# Patient Record
Sex: Male | Born: 1955 | Race: White | Hispanic: No | Marital: Single | Smoking: Never smoker
Health system: Southern US, Community
[De-identification: ages and names within clinical notes are randomized; demographics above are authoritative.]

## PROBLEM LIST (undated history)

## (undated) DIAGNOSIS — F70 Mild intellectual disabilities: Secondary | ICD-10-CM

## (undated) DIAGNOSIS — I2510 Atherosclerotic heart disease of native coronary artery without angina pectoris: Secondary | ICD-10-CM

## (undated) DIAGNOSIS — I10 Essential (primary) hypertension: Secondary | ICD-10-CM

## (undated) DIAGNOSIS — E119 Type 2 diabetes mellitus without complications: Secondary | ICD-10-CM

## (undated) DIAGNOSIS — J45909 Unspecified asthma, uncomplicated: Secondary | ICD-10-CM

## (undated) DIAGNOSIS — I219 Acute myocardial infarction, unspecified: Secondary | ICD-10-CM

## (undated) DIAGNOSIS — E669 Obesity, unspecified: Secondary | ICD-10-CM

## (undated) DIAGNOSIS — K219 Gastro-esophageal reflux disease without esophagitis: Secondary | ICD-10-CM

## (undated) DIAGNOSIS — R1310 Dysphagia, unspecified: Secondary | ICD-10-CM

## (undated) DIAGNOSIS — E785 Hyperlipidemia, unspecified: Secondary | ICD-10-CM

## (undated) HISTORY — DX: Gastro-esophageal reflux disease without esophagitis: K21.9

## (undated) HISTORY — DX: Mild intellectual disabilities: F70

## (undated) HISTORY — DX: Dysphagia, unspecified: R13.10

## (undated) HISTORY — DX: Atherosclerotic heart disease of native coronary artery without angina pectoris: I25.10

## (undated) HISTORY — DX: Essential (primary) hypertension: I10

## (undated) HISTORY — DX: Hyperlipidemia, unspecified: E78.5

## (undated) HISTORY — DX: Obesity, unspecified: E66.9

---

## 1989-07-17 HISTORY — PX: INGUINAL HERNIA REPAIR: SUR1180

## 2013-08-16 HISTORY — PX: CARDIAC CATHETERIZATION: SHX172

## 2013-08-28 ENCOUNTER — Ambulatory Visit: Payer: Self-pay | Admitting: Cardiovascular Disease

## 2013-12-14 ENCOUNTER — Encounter: Payer: Self-pay | Admitting: *Deleted

## 2013-12-15 ENCOUNTER — Ambulatory Visit (INDEPENDENT_AMBULATORY_CARE_PROVIDER_SITE_OTHER): Payer: Medicare Other | Admitting: Cardiovascular Disease

## 2013-12-15 ENCOUNTER — Other Ambulatory Visit: Payer: Self-pay

## 2013-12-15 ENCOUNTER — Encounter: Payer: Self-pay | Admitting: *Deleted

## 2013-12-15 ENCOUNTER — Ambulatory Visit: Payer: Self-pay | Admitting: Cardiovascular Disease

## 2013-12-15 ENCOUNTER — Encounter: Payer: Self-pay | Admitting: Cardiovascular Disease

## 2013-12-15 VITALS — BP 100/78 | HR 76 | Ht 68.0 in | Wt 232.8 lb

## 2013-12-15 DIAGNOSIS — I10 Essential (primary) hypertension: Secondary | ICD-10-CM

## 2013-12-15 DIAGNOSIS — E785 Hyperlipidemia, unspecified: Secondary | ICD-10-CM

## 2013-12-15 DIAGNOSIS — I25810 Atherosclerosis of coronary artery bypass graft(s) without angina pectoris: Secondary | ICD-10-CM

## 2013-12-15 DIAGNOSIS — I2510 Atherosclerotic heart disease of native coronary artery without angina pectoris: Secondary | ICD-10-CM

## 2013-12-15 MED ORDER — CLOPIDOGREL BISULFATE 75 MG PO TABS: 75.0000 mg | ORAL_TABLET | Freq: Every day | ORAL | Status: DC

## 2013-12-15 NOTE — Patient Instructions (Addendum)
Your physician has recommended you make the following change in your medication:  Start Plavix 75 mg daily   You will need to have [facility] before the day of your procedure   Your physician recommends that you have lab work today for your procedure.   You are having a Angioplasty with stent placement with Dr. Kirke [facility]inArida at [facility] on 12/27/13 at 11:30 am.

## 2013-12-16 ENCOUNTER — Encounter: Payer: Self-pay | Admitting: Cardiovascular Disease

## 2013-12-16 DIAGNOSIS — E785 Hyperlipidemia, unspecified: Secondary | ICD-10-CM | POA: Insufficient documentation

## 2013-12-16 DIAGNOSIS — I10 Essential (primary) hypertension: Secondary | ICD-10-CM | POA: Insufficient documentation

## 2013-12-16 DIAGNOSIS — I2510 Atherosclerotic heart disease of native coronary artery without angina pectoris: Secondary | ICD-10-CM | POA: Insufficient documentation

## 2013-12-16 LAB — CBC WITH DIFFERENTIAL
BASOS ABS: 0 10*3/uL (ref 0.0–0.2)
Basos: 0 %
EOS ABS: 0.6 10*3/uL — AB (ref 0.0–0.4)
Eos: 8 %
HEMATOCRIT: 45 % (ref 37.5–51.0)
Hemoglobin: 15.3 g/dL (ref 12.6–17.7)
Immature Grans (Abs): 0 10*3/uL (ref 0.0–0.1)
Immature Granulocytes: 0 %
LYMPHS: 18 %
Lymphocytes Absolute: 1.5 10*3/uL (ref 0.7–3.1)
MCH: 28.2 pg (ref 26.6–33.0)
MCHC: 34 g/dL (ref 31.5–35.7)
MCV: 83 fL (ref 79–97)
Monocytes Absolute: 0.6 10*3/uL (ref 0.1–0.9)
Monocytes: 7 %
Neutrophils Absolute: 5.5 10*3/uL (ref 1.4–7.0)
Neutrophils Relative %: 67 %
Platelets: 223 10*3/uL (ref 150–379)
RBC: 5.42 x10E6/uL (ref 4.14–5.80)
RDW: 14.2 % (ref 12.3–15.4)
WBC: 8.2 10*3/uL (ref 3.4–10.8)

## 2013-12-16 LAB — BASIC METABOLIC PANEL
BUN/Creatinine Ratio: 11 (ref 9–20)
BUN: 15 mg/dL (ref 6–24)
CHLORIDE: 87 mmol/L — AB (ref 97–108)
CO2: 26 mmol/L (ref 18–29)
Calcium: 9.7 mg/dL (ref 8.7–10.2)
Creatinine, Ser: 1.32 mg/dL — ABNORMAL HIGH (ref 0.76–1.27)
GFR calc non Af Amer: 59 mL/min/{1.73_m2} — ABNORMAL LOW (ref >59)
GFR, EST AFRICAN AMERICAN: 69 mL/min/{1.73_m2} (ref >59)
GLUCOSE: 354 mg/dL — AB (ref 65–99)
Potassium: 3.8 mmol/L (ref 3.5–5.2)
Sodium: 134 mmol/L (ref 134–144)

## 2013-12-16 LAB — PROTIME-INR
INR: 1.1 (ref 0.8–1.2)
Prothrombin Time: 10.8 s (ref 9.1–12.0)

## 2013-12-16 NOTE — Assessment & Plan Note (Signed)
Blood pressure is well controlled on current medications. 

## 2013-12-16 NOTE — Progress Notes (Signed)
Primary care physician: Dr. Dario GuardianJadali.   HPI  This is a 58 year old man who was referred by Dr. Dario GuardianJadali for a second opinion regarding management of coronary artery disease. He has known history of type 2 diabetes, hypertension, hyperlipidemia and obesity. He has known history of mild mental retardation and currently lives in a group home. He makes his own decisions. In October of 2014, he underwent a nuclear stress test due to significant exertional dyspnea. The stress test was abnormal and thus he was referred for cardiac catheterization which was done by Dr. Welton FlakesKhan. It showed significant three-vessel coronary artery disease. [facility] was [facility]. However, the patient had no good targets in the LAD. The RCA disease was also in the small branches. Thus, he was felt to be not a candidate for CABG. Medical therapy versus PCI was recommended. He was supposed to see an interventional cardiologist there but that never happened. The patient denies any chest discomfort. However, he has dyspnea with minimal activities which has been progressive. No orthopnea, PND or lower extremity edema. He is compliant with his medications. I reviewed the patient's previous records and also personally reviewed his cardiac catheterization film.  I spent a minimal of 30 minutes reviewing his records.  No Known Allergies   Current Outpatient Prescriptions on File Prior to Visit  Medication Sig Dispense Refill  . albuterol (PROVENTIL HFA;VENTOLIN HFA) 108 (90 BASE) MCG/ACT inhaler Inhale into the lungs every 6 (six) hours as needed for wheezing or shortness of breath.      Marland Kitchen. aspirin 81 MG tablet Take 81 mg by mouth daily.      Marland Kitchen. atorvastatin (LIPITOR) 40 MG tablet Take 40 mg by mouth daily.      . DULoxetine (CYMBALTA) 60 MG capsule Take 60 mg by mouth daily.      Marland Kitchen. glipiZIDE (GLUCOTROL) 5 MG tablet Take 5 mg by mouth daily before breakfast.      . lisinopril (PRINIVIL,ZESTRIL) 40 MG tablet Take 40 mg by mouth  daily.      . metFORMIN (GLUCOPHAGE) 500 MG tablet Take 500 mg by mouth 2 (two) times daily with a meal.      . metoprolol succinate (TOPROL-XL) 50 MG 24 hr tablet Take 50 mg by mouth 2 (two) times daily. Take with or immediately following a meal.      . risperidone (RISPERDAL) 4 MG tablet Take 4 mg by mouth daily.      . sitaGLIPtin (JANUVIA) 100 MG tablet Take 100 mg by mouth daily.      . traZODone (DESYREL) 50 MG tablet Take 50 mg by mouth at bedtime.       No current facility-administered medications on file prior to visit.     Past Medical History  Diagnosis Date  . Diabetes mellitus without complication   . Obesity   . Mildly mentally retarded   . Coronary atherosclerosis of native coronary vessel     Cardiac catheterization in October of 2014 showed significant three-vessel coronary artery disease with ejection fraction of 55%. [facility] was [facility] but felt that there was no good target in the LAD.  Marland Kitchen. Hyperlipidemia   . Hypertension      Past Surgical History  Procedure Laterality Date  . Inguinal hernia repair    . Cardiac catheter[facility]  08/[facility]     Family History  Problem Relation Age of Onset  . Hypertension Father   . Diabetes Father  History   Social History  . Marital Status: Single    Spouse Name: N/A    Number of Children: N/A  . Years of Education: N/A   Occupational History  . Not on file.   Social History Main Topics  . Smoking status: Never Smoker   . Smokeless tobacco: Not on file  . Alcohol Use: No  . Drug Use: No  . Sexual Activity: Not on file   Other Topics Concern  . Not on file   Social History Narrative  . No narrative on file     ROS A 10 point review of system was performed. It is negative other than that mentioned in the history of present illness.   PHYSICAL EXAM   BP 100/78  Pulse 76  Ht 5\' 8"  (1.727 m)  Wt 232 lb 12 oz (105.575 kg)  BMI 35.40 kg/m2 Constitutional: He is oriented to  person, place, and time. He appears well-developed and well-nourished. No distress.  HENT: No nasal discharge.  Head: Normocephalic and atraumatic.  Eyes: Pupils are equal and round.  No discharge. Neck: Normal range of motion. Neck supple. No JVD present. No thyromegaly present.  Cardiovascular: Normal rate, regular rhythm, normal heart sounds. Exam reveals no gallop and no friction rub. No murmur heard.  Pulmonary/Chest: Effort normal and breath sounds normal. No stridor. No respiratory distress. He has no wheezes. He has no rales. He exhibits no tenderness.  Abdominal: Soft. Bowel sounds are normal. He exhibits no distension. There is no tenderness. There is no rebound and no guarding.  Musculoskeletal: Normal range of motion. He exhibits no edema and no tenderness.  Neurological: He is alert and oriented to person, place, and time. Coordination normal.  Skin: Skin is warm and dry. No rash noted. He is not diaphoretic. No erythema. No pallor.  Psychiatric: He has a normal mood and affect. His behavior is normal. Judgment and thought content normal.       EKG: Normal sinus rhythm with incomplete right bundle branch block left anterior fascicular block and previous septal and inferior infarcts   ASSESSMENT AND PLAN

## 2013-12-16 NOTE — Assessment & Plan Note (Signed)
He is currently on atorvastatin 40 mg once daily.

## 2013-12-16 NOTE — Assessment & Plan Note (Signed)
Although the patient has no chest discomfort, his dyspnea with minimal activities suggestive of class III angina in spite of optimal medical therapy. He had an abnormal nuclear stress test. I personally reviewed his cardiac catheterization which showed:  Severe diffuse disease in the LAD extending from the mid to distal segment in multiple areas, severe diagonal disease, severe 95% stenosis in the ostial ramus artery which is a large artery supplying the majority of the lateral wall. The RCA was ectatic with mild nonobstructive disease in the main vessel. There was diffuse disease in the PDA and PLs.  I agree that there are no good targets for CABG. However, the patient has large myocardium at risk for ischemia especially the LAD and ramus. Due to that, I recommend proceeding with attempted PCI of the LAD and ramus. The LAD will likely require multiple stents. The disease in the ramus is ostial but has favorable angulation for stent placement. Nonetheless, this is definitely going to be a higher-risk PCI than average. This was explained extensively to the patient and his caregiver. I recommend starting treatment with Plavix 75 mg once daily.

## 2013-12-18 ENCOUNTER — Ambulatory Visit: Payer: Self-pay | Admitting: Internal Medicine

## 2013-12-22 ENCOUNTER — Encounter (HOSPITAL_COMMUNITY): Payer: Self-pay | Admitting: Pharmacy Technician

## 2013-12-22 ENCOUNTER — Telehealth: Payer: Self-pay

## 2013-12-22 NOTE — Telephone Encounter (Signed)
Pt caregiver has some questions regarding his upcoming procedure. Please call.

## 2013-12-22 NOTE — Telephone Encounter (Signed)
Group home nurse had a question. His prime doc answered it for her and she no longer needed my help.

## 2013-12-27 ENCOUNTER — Encounter (HOSPITAL_COMMUNITY)
Admission: RE | Disposition: A | Discharge status: Home / Self Care | Payer: Federal, State, Local not specified - PPO | Source: Ambulatory Visit | Attending: Cardiovascular Disease

## 2013-12-27 ENCOUNTER — Encounter (HOSPITAL_COMMUNITY): Payer: Self-pay | Admitting: *Deleted

## 2013-12-27 ENCOUNTER — Ambulatory Visit (HOSPITAL_COMMUNITY)
Admission: RE | Admit: 2013-12-27 | Discharge: 2013-12-28 | Disposition: A | Discharge status: Home / Self Care | Payer: Medicare Other | Source: Ambulatory Visit | Attending: Cardiovascular Disease | Admitting: Cardiovascular Disease

## 2013-12-27 DIAGNOSIS — I25810 Atherosclerosis of coronary artery bypass graft(s) without angina pectoris: Secondary | ICD-10-CM

## 2013-12-27 DIAGNOSIS — E669 Obesity, unspecified: Secondary | ICD-10-CM | POA: Diagnosis not present

## 2013-12-27 DIAGNOSIS — J45909 Unspecified asthma, uncomplicated: Secondary | ICD-10-CM | POA: Diagnosis not present

## 2013-12-27 DIAGNOSIS — E785 Hyperlipidemia, unspecified: Secondary | ICD-10-CM | POA: Diagnosis present

## 2013-12-27 DIAGNOSIS — I4510 Unspecified right bundle-branch block: Secondary | ICD-10-CM | POA: Insufficient documentation

## 2013-12-27 DIAGNOSIS — Z7982 Long term (current) use of aspirin: Secondary | ICD-10-CM | POA: Diagnosis not present

## 2013-12-27 DIAGNOSIS — I2510 Atherosclerotic heart disease of native coronary artery without angina pectoris: Secondary | ICD-10-CM | POA: Diagnosis present

## 2013-12-27 DIAGNOSIS — I2089 Other forms of angina pectoris: Secondary | ICD-10-CM | POA: Insufficient documentation

## 2013-12-27 DIAGNOSIS — F70 Mild intellectual disabilities: Secondary | ICD-10-CM | POA: Diagnosis not present

## 2013-12-27 DIAGNOSIS — I208 Other forms of angina pectoris: Secondary | ICD-10-CM | POA: Insufficient documentation

## 2013-12-27 DIAGNOSIS — Z6835 Body mass index (BMI) 35.0-35.9, adult: Secondary | ICD-10-CM | POA: Diagnosis not present

## 2013-12-27 DIAGNOSIS — E119 Type 2 diabetes mellitus without complications: Secondary | ICD-10-CM | POA: Diagnosis present

## 2013-12-27 DIAGNOSIS — Z955 Presence of coronary angioplasty implant and graft: Secondary | ICD-10-CM

## 2013-12-27 DIAGNOSIS — I209 Angina pectoris, unspecified: Secondary | ICD-10-CM

## 2013-12-27 DIAGNOSIS — I10 Essential (primary) hypertension: Secondary | ICD-10-CM | POA: Diagnosis not present

## 2013-12-27 HISTORY — DX: Type 2 diabetes mellitus without complications: E11.9

## 2013-12-27 HISTORY — PX: CORONARY ANGIOPLASTY WITH STENT PLACEMENT: SHX49

## 2013-12-27 HISTORY — PX: PERCUTANEOUS CORONARY STENT INTERVENTION (PCI-S): SHX5485

## 2013-12-27 HISTORY — DX: Unspecified asthma, uncomplicated: J45.909

## 2013-12-27 LAB — GLUCOSE, CAPILLARY
GLUCOSE-CAPILLARY: 255 mg/dL — AB (ref 70–99)
GLUCOSE-CAPILLARY: 247 mg/dL — AB (ref 70–99)
Glucose-Capillary: 274 mg/dL — ABNORMAL HIGH (ref 70–99)

## 2013-12-27 LAB — POCT ACTIVATED CLOTTING TIME: ACTIVATED CLOTTING TIME: 370 s

## 2013-12-27 SURGERY — PERCUTANEOUS CORONARY STENT INTERVENTION (PCI-S)
Anesthesia: LOCAL

## 2013-12-27 MED ORDER — HEPARIN (PORCINE) IN NACL 2-0.9 UNIT/ML-% IJ SOLN
INTRAMUSCULAR | Status: AC
  Filled 2013-12-27: qty 1500

## 2013-12-27 MED ORDER — NITROGLYCERIN 0.2 MG/ML ON CALL CATH LAB
INTRAVENOUS | Status: AC
  Filled 2013-12-27: qty 1

## 2013-12-27 MED ORDER — ALBUTEROL SULFATE HFA 108 (90 BASE) MCG/ACT IN AERS: 1.0000 | INHALATION_SPRAY | Freq: Three times a day (TID) | RESPIRATORY_TRACT | Status: DC

## 2013-12-27 MED ORDER — LINAGLIPTIN 5 MG PO TABS
5.0000 mg | ORAL_TABLET | Freq: Every day | ORAL | Status: DC
  Administered 2013-12-27 – 2013-12-28 (×3): 5 mg via ORAL
  Filled 2013-12-27 (×2): qty 1

## 2013-12-27 MED ORDER — CLOPIDOGREL BISULFATE 75 MG PO TABS
75.0000 mg | ORAL_TABLET | Freq: Every day | ORAL | Status: DC
  Administered 2013-12-28: 10:00:00 75 mg via ORAL
  Filled 2013-12-27: qty 1

## 2013-12-27 MED ORDER — MIDAZOLAM HCL 2 MG/2ML IJ SOLN
INTRAMUSCULAR | Status: AC
  Filled 2013-12-27: qty 2

## 2013-12-27 MED ORDER — SODIUM CHLORIDE 0.9 % IJ SOLN: 3.0000 mL | Freq: Two times a day (BID) | INTRAMUSCULAR | Status: DC

## 2013-12-27 MED ORDER — GLIPIZIDE ER 5 MG PO TB24
5.0000 mg | ORAL_TABLET | Freq: Two times a day (BID) | ORAL | Status: DC
  Administered 2013-12-27 – 2013-12-28 (×2): 5 mg via ORAL
  Filled 2013-12-27 (×3): qty 1

## 2013-12-27 MED ORDER — INSULIN ASPART 100 UNIT/ML ~~LOC~~ SOLN
0.0000 [IU] | Freq: Every day | SUBCUTANEOUS | Status: DC
Start: 2013-12-27 — End: 2013-12-28
  Administered 2013-12-27: 22:00:00 3 [IU] via SUBCUTANEOUS

## 2013-12-27 MED ORDER — INSULIN ASPART 100 UNIT/ML ~~LOC~~ SOLN: 0.0000 [IU] | Freq: Three times a day (TID) | SUBCUTANEOUS | Status: DC

## 2013-12-27 MED ORDER — ALBUTEROL SULFATE (2.5 MG/3ML) 0.083% IN NEBU
2.5000 mg | INHALATION_SOLUTION | Freq: Three times a day (TID) | RESPIRATORY_TRACT | Status: DC
  Administered 2013-12-27: 2.5 mg via RESPIRATORY_TRACT
  Filled 2013-12-27 (×2): qty 3

## 2013-12-27 MED ORDER — SODIUM CHLORIDE 0.9 % IV SOLN
INTRAVENOUS | Status: DC
  Administered 2013-12-27: 17:00:00 via INTRAVENOUS

## 2013-12-27 MED ORDER — ASPIRIN 81 MG PO CHEW
81.0000 mg | CHEWABLE_TABLET | Freq: Every day | ORAL | Status: DC
  Administered 2013-12-27 – 2013-12-28 (×2): 81 mg via ORAL
  Filled 2013-12-27 (×2): qty 1

## 2013-12-27 MED ORDER — SODIUM CHLORIDE 0.9 % IV SOLN
0.2500 mg/kg/h | INTRAVENOUS | Status: DC
  Filled 2013-12-27: qty 250

## 2013-12-27 MED ORDER — ASPIRIN 81 MG PO CHEW: 81.0000 mg | CHEWABLE_TABLET | ORAL | Status: DC

## 2013-12-27 MED ORDER — ASPIRIN 81 MG PO TABS: 81.0000 mg | ORAL_TABLET | Freq: Every day | ORAL | Status: DC

## 2013-12-27 MED ORDER — BIVALIRUDIN 250 MG IV SOLR
INTRAVENOUS | Status: AC
  Filled 2013-12-27: qty 250

## 2013-12-27 MED ORDER — CLOPIDOGREL BISULFATE 300 MG PO TABS
ORAL_TABLET | ORAL | Status: AC
  Filled 2013-12-27: qty 1

## 2013-12-27 MED ORDER — METOPROLOL TARTRATE 1 MG/ML IV SOLN
INTRAVENOUS | Status: AC
  Filled 2013-12-27: qty 5

## 2013-12-27 MED ORDER — CLOPIDOGREL BISULFATE 75 MG PO TABS: 75.0000 mg | ORAL_TABLET | ORAL | Status: DC

## 2013-12-27 MED ORDER — LISINOPRIL 40 MG PO TABS
40.0000 mg | ORAL_TABLET | Freq: Every day | ORAL | Status: DC
  Administered 2013-12-27 – 2013-12-28 (×3): 40 mg via ORAL
  Filled 2013-12-27 (×2): qty 1

## 2013-12-27 MED ORDER — FENTANYL CITRATE 0.05 MG/ML IJ SOLN
INTRAMUSCULAR | Status: AC
  Filled 2013-12-27: qty 2

## 2013-12-27 MED ORDER — LIDOCAINE HCL (PF) 1 % IJ SOLN
INTRAMUSCULAR | Status: AC
  Filled 2013-12-27: qty 30

## 2013-12-27 MED ORDER — ATORVASTATIN CALCIUM 40 MG PO TABS
40.0000 mg | ORAL_TABLET | Freq: Every day | ORAL | Status: DC
  Administered 2013-12-27 – 2013-12-28 (×3): 40 mg via ORAL
  Filled 2013-12-27 (×2): qty 1

## 2013-12-27 MED ORDER — DULOXETINE HCL 60 MG PO CPEP
60.0000 mg | ORAL_CAPSULE | Freq: Every day | ORAL | Status: DC
  Administered 2013-12-27 – 2013-12-28 (×3): 60 mg via ORAL
  Filled 2013-12-27 (×2): qty 1

## 2013-12-27 MED ORDER — RISPERIDONE 2 MG PO TABS
4.0000 mg | ORAL_TABLET | Freq: Every day | ORAL | Status: DC
  Filled 2013-12-27: qty 2

## 2013-12-27 MED ORDER — SODIUM CHLORIDE 0.9 % IV SOLN: 250.0000 mL | INTRAVENOUS | Status: DC | PRN

## 2013-12-27 MED ORDER — TRAZODONE HCL 50 MG PO TABS
50.0000 mg | ORAL_TABLET | Freq: Every day | ORAL | Status: DC
  Administered 2013-12-27: 50 mg via ORAL
  Filled 2013-12-27 (×2): qty 1

## 2013-12-27 MED ORDER — METOPROLOL TARTRATE 50 MG PO TABS
50.0000 mg | ORAL_TABLET | Freq: Two times a day (BID) | ORAL | Status: DC
  Administered 2013-12-27 – 2013-12-28 (×2): 50 mg via ORAL
  Filled 2013-12-27: qty 1
  Filled 2013-12-27: qty 2
  Filled 2013-12-27 (×2): qty 1

## 2013-12-27 MED ORDER — SODIUM CHLORIDE 0.9 % IV SOLN
INTRAVENOUS | Status: DC
  Administered 2013-12-27: 100 mL/h via INTRAVENOUS

## 2013-12-27 MED ORDER — SODIUM CHLORIDE 0.9 % IJ SOLN: 3.0000 mL | INTRAMUSCULAR | Status: DC | PRN

## 2013-12-27 NOTE — H&P (View-Only) (Signed)
Primary care physician: Dr. Dario GuardianJadali.   HPI  This is a 58 year old man who was referred by Dr. Dario GuardianJadali for a second opinion regarding management of coronary artery disease. He has known history of type 2 diabetes, hypertension, hyperlipidemia and obesity. He has known history of mild mental retardation and currently lives in a group home. He makes his own decisions. In October of 2014, he underwent a nuclear stress test due to significant exertional dyspnea. The stress test was abnormal and thus he was referred for cardiac catheterization which was done by Dr. Welton FlakesKhan. It showed significant three-vessel coronary artery disease. He was [facility] to [facility]. However, the patient had no good targets in the LAD. The RCA disease was also in the small branches. Thus, he was felt to be not a candidate for CABG. Medical therapy versus PCI was recommended. He was supposed to see an interventional cardiologist there but that never happened. The patient denies any chest discomfort. However, he has dyspnea with minimal activities which has been progressive. No orthopnea, PND or lower extremity edema. He is compliant with his medications. I reviewed the patient's previous records and also personally reviewed his cardiac catheterization film.  I spent a minimal of 30 minutes reviewing his records.  No Known Allergies   Current Outpatient Prescriptions on File Prior to Visit  Medication Sig Dispense Refill  . albuterol (PROVENTIL HFA;VENTOLIN HFA) 108 (90 BASE) MCG/ACT inhaler Inhale into the lungs every 6 (six) hours as needed for wheezing or shortness of breath.      Marland Kitchen. aspirin 81 MG tablet Take 81 mg by mouth daily.      Marland Kitchen. atorvastatin (LIPITOR) 40 MG tablet Take 40 mg by mouth daily.      . DULoxetine (CYMBALTA) 60 MG capsule Take 60 mg by mouth daily.      Marland Kitchen. glipiZIDE (GLUCOTROL) 5 MG tablet Take 5 mg by mouth daily before breakfast.      . lisinopril (PRINIVIL,ZESTRIL) 40 MG tablet Take 40 mg by mouth  daily.      . metFORMIN (GLUCOPHAGE) 500 MG tablet Take 500 mg by mouth 2 (two) times daily with a meal.      . metoprolol succinate (TOPROL-XL) 50 MG 24 hr tablet Take 50 mg by mouth 2 (two) times daily. Take with or immediately following a meal.      . risperidone (RISPERDAL) 4 MG tablet Take 4 mg by mouth daily.      . sitaGLIPtin (JANUVIA) 100 MG tablet Take 100 mg by mouth daily.      . traZODone (DESYREL) 50 MG tablet Take 50 mg by mouth at bedtime.       No current facility-administered medications on file prior to visit.     Past Medical History  Diagnosis Date  . Diabetes mellitus without complication   . Obesity   . Mildly mentally retarded   . Coronary atherosclerosis of native coronary vessel     Cardiac catheterization in October of 2014 showed significant three-vessel coronary artery disease with ejection fraction of 55%. He was [facility] at [facility] but felt that there was no good target in the LAD.  Marland Kitchen. Hyperlipidemia   . Hypertension      Past Surgical History  Procedure Laterality Date  . Inguinal hernia repair    . Cardiac catheterization  08/2013    [facility]     Family History  Problem Relation Age of Onset  . Hypertension Father   . Diabetes Father  History   Social History  . Marital Status: Single    Spouse Name: N/A    Number of Children: N/A  . Years of Education: N/A   Occupational History  . Not on file.   Social History Main Topics  . Smoking status: Never Smoker   . Smokeless tobacco: Not on file  . Alcohol Use: No  . Drug Use: No  . Sexual Activity: Not on file   Other Topics Concern  . Not on file   Social History Narrative  . No narrative on file     ROS A 10 point review of system was performed. It is negative other than that mentioned in the history of present illness.   PHYSICAL EXAM   BP 100/78  Pulse 76  Ht 5\' 8"  (1.727 m)  Wt 232 lb 12 oz (105.575 kg)  BMI 35.40 kg/m2 Constitutional: He is oriented to  person, place, and time. He appears well-developed and well-nourished. No distress.  HENT: No nasal discharge.  Head: Normocephalic and atraumatic.  Eyes: Pupils are equal and round.  No discharge. Neck: Normal range of motion. Neck supple. No JVD present. No thyromegaly present.  Cardiovascular: Normal rate, regular rhythm, normal heart sounds. Exam reveals no gallop and no friction rub. No murmur heard.  Pulmonary/Chest: Effort normal and breath sounds normal. No stridor. No respiratory distress. He has no wheezes. He has no rales. He exhibits no tenderness.  Abdominal: Soft. Bowel sounds are normal. He exhibits no distension. There is no tenderness. There is no rebound and no guarding.  Musculoskeletal: Normal range of motion. He exhibits no edema and no tenderness.  Neurological: He is alert and oriented to person, place, and time. Coordination normal.  Skin: Skin is warm and dry. No rash noted. He is not diaphoretic. No erythema. No pallor.  Psychiatric: He has a normal mood and affect. His behavior is normal. Judgment and thought content normal.       EKG: Normal sinus rhythm with incomplete right bundle branch block left anterior fascicular block and previous septal and inferior infarcts   ASSESSMENT AND PLAN

## 2013-12-27 NOTE — CV Procedure (Signed)
Cardiac Catheterization Procedure Note  Name: Maurion Walkowiak MRN: [id number] DOB: 09-29-1956  Procedure: Left Heart Cath, Selective Coronary Angiography, balloon angioplasty of the distal LAD mid LAD without stent placement, balloon angioplasty and drug-eluting stent placement to the ostial first diagonal  Indication: Class III angina on optimal medical therapy. See clinic note for details.  Medications:  Sedation:  2 mg IV Versed, 75 mcg IV Fentanyl  Contrast:  260 mL Omnipaque  Procedural Details: The right wrist was prepped, draped, and anesthetized with 1% lidocaine. Using the modified Seldinger technique, a 5 French Slender sheath was introduced into the right radial artery. 3 mg of verapamil was administered through the sheath. A right Judkins catheter was used to image the right coronary artery and recorded ventricular pressure. Left ventricular angiography was not performed. An XB 3.0 guiding catheter was used to engage the left main coronary artery.  Catheter exchanges were performed over an exchange length guidewire. There were no immediate procedural complications.  Procedural Findings:  Hemodynamics: AO:  157/94   mmHg LV:  159/8    mmHg LVEDP: 15  mmHg  Coronary angiography: Coronary dominance: Right   Left Main:  Mildly calcified and normal.  Left Anterior Descending (LAD):  Heavily calcified throughout its course. There is diffuse 80% stenosis in the midsegment followed by 95% stenosis in the distal segment. The whole distal LAD is diffusely diseased and subtotally occluded.  1st diagonal (D1):  Very large in size with 95% ostial stenosis.  2nd diagonal (D2):  Normal in size with 90% ostial stenosis.  3rd diagonal (D3):  Medium in size with 80-90% ostial stenosis. There is a 4th diagonal which is medium in size with 60% ostial stenosis.  Circumflex (LCx):  Small in size with 90% proximal stenosis. Most of the posterior lateral wall is actually supplied by the first  diagonal.  Right Coronary Artery: Normal in size and dominant. There is mild ectasia in the proximal and midsegment with 20% mid stenosis.  Posterior descending artery: Normal in size with minor irregularities.  Posterior AV segment: Normal in size with minor irregularities.  Posterolateral branchs:  PL1 is small in size with diffuse 60% disease. PL 2 is normal in size with 90% ostial stenosis.  Left ventriculography: Was not performed  PCI Note:  Following the diagnostic procedure, the decision was made to proceed with PCI.  Weight-based bivalirudin was given for anticoagulation. Once a therapeutic ACT was achieved, a 6 [place] XB 3.0 guide catheter was inserted.  A  intuition coronary guidewire was used to cross the lesion in the proximal and distal LAD.  The distal lesion was predilated with a 2.0 x 15 mm balloon balloon with multiple prolonged inflations this area did not improve in spite of multiple inflations. The same balloon with a then used to predilate the lesion in the mid LAD. My plan was to stent the LAD only if there is good distal runoff achieved with balloon angioplasty. This was not the case he and I felt that placing a stent would place the patient at significant risk for future stent thrombosis. Due to that, I elected to use and angioscope alone to dilate the mid lesion. The balloon was 2.5 x 15 mm with prolonged inflations performed throughout the midsegment. There was residual 80% disease in the distal lesion and 40-50% residual disease in the mid LAD. I then used the same wire to cross the lesion in the first diagonal. The lesion was predilated with a 2.0 x 12 mm  balloon. I tried to place a 2.75 x 16 mm Promus drug-eluting stent but this did not pass the lesion due to significant calcifications and tortuosity. I attempted to use and angioscope alone but this did not cross either. I decided to go with a 2.5 x 12 mm compliant balloon and dilated the lesion to 12 atmosphere. The stent  did not pass. I decided to use a second buddy wire. In the meanwhile, I did lose guiding catheter position and had to change guiding catheter to an XB LAD 3.5.  The lesion was then stented with a 2.75 x 16 mm Promus drug-eluting stent. The stent was 1 millimeter into the LAD. Due to significant tortuosity in that area, I felt that using a noncompliant balloon to post dilate with of the feasible.  Following PCI, there was 10 % residual stenosis and TIMI-3 flow. Final angiography confirmed an excellent result. The patient tolerated the procedure well. There were no immediate procedural complications. A TR band was used for radial hemostasis. The patient was transferred to the post catheterization recovery area for further monitoring.  PCI Data: Vessel - LAD/Segment - distal Percent Stenosis (pre)  95 TIMI-flow 2 Stent balloon angioplasty only Percent Stenosis (post) 80 TIMI-flow (post) 2  Vessel - LAD/Segment - mid Percent Stenosis (pre)  80% TIMI-flow 3 Stent balloon angioplasty only with and angioscore balloon Percent Stenosis (post) 40-50% TIMI-flow (post) 3  Vessel - first diagonal/Segment - ostial Percent Stenosis (pre)  95% TIMI-flow 3 Stent 2.75 x 16 mm Promus drug-eluting stent Percent Stenosis (post) 10% TIMI-flow (post) 3  Final Conclusions:  1. Significant three-vessel coronary artery disease with diffuse and distally occluded LAD.  2.  unsuccessful distal LAD angioplasty due to a diffuse disease. Successful balloon angioplasty of the mid LAD without stent placement 3. Successful drug-eluting stent placement to the ostial diagonal  Recommendations:  Dual antiplatelet therapy for at least 12 months.  Lorine BearsMuhammad Arida MD, [facility] 12/27/2013, 3:51 PM

## 2013-12-27 NOTE — Interval H&P Note (Signed)
Cath Lab Visit (complete for each Cath Lab visit)  Clinical Evaluation Leading to the Procedure:   ACS: no  Non-ACS:    Anginal Classification: CCS III  Anti-ischemic medical therapy: Maximal Therapy (2 or more classes of medications)  Non-Invasive Test Results: No non-invasive testing performed  Prior CABG: No previous CABG      History and Physical Interval Note:  12/27/2013 1:44 PM  Larry Riggs  has presented today for surgery, with the diagnosis of Claudication  The various methods of treatment have been discussed with the patient and family. After consideration of risks, benefits and other options for treatment, the patient has consented to  Procedure(s): PERCUTANEOUS CORONARY STENT INTERVENTION (PCI-S) (N/A) as a surgical intervention .  The patient's history has been reviewed, patient examined, no change in status, stable for surgery.  I have reviewed the patient's chart and labs.  Questions were answered to the patient's satisfaction.     Larry Riggs

## 2013-12-28 ENCOUNTER — Telehealth: Payer: Self-pay

## 2013-12-28 ENCOUNTER — Other Ambulatory Visit: Payer: Self-pay

## 2013-12-28 ENCOUNTER — Encounter (HOSPITAL_COMMUNITY): Payer: Self-pay | Admitting: Nurse Practitioner

## 2013-12-28 DIAGNOSIS — I2510 Atherosclerotic heart disease of native coronary artery without angina pectoris: Secondary | ICD-10-CM | POA: Diagnosis not present

## 2013-12-28 DIAGNOSIS — Z9861 Coronary angioplasty status: Secondary | ICD-10-CM

## 2013-12-28 DIAGNOSIS — E785 Hyperlipidemia, unspecified: Secondary | ICD-10-CM

## 2013-12-28 DIAGNOSIS — Z955 Presence of coronary angioplasty implant and graft: Secondary | ICD-10-CM | POA: Insufficient documentation

## 2013-12-28 DIAGNOSIS — E119 Type 2 diabetes mellitus without complications: Secondary | ICD-10-CM | POA: Diagnosis present

## 2013-12-28 DIAGNOSIS — I10 Essential (primary) hypertension: Secondary | ICD-10-CM

## 2013-12-28 DIAGNOSIS — J45909 Unspecified asthma, uncomplicated: Secondary | ICD-10-CM | POA: Diagnosis present

## 2013-12-28 DIAGNOSIS — I209 Angina pectoris, unspecified: Secondary | ICD-10-CM

## 2013-12-28 LAB — CBC
HCT: 37.3 % — ABNORMAL LOW (ref 39.0–52.0)
HEMOGLOBIN: 13.3 g/dL (ref 13.0–17.0)
MCH: 28.9 pg (ref 26.0–34.0)
MCHC: 35.7 g/dL (ref 30.0–36.0)
MCV: 80.9 fL (ref 78.0–100.0)
Platelets: 168 10*3/uL (ref 150–400)
RBC: 4.61 MIL/uL (ref 4.22–5.81)
RDW: 12.9 % (ref 11.5–15.5)
WBC: 7.2 10*3/uL (ref 4.0–10.5)

## 2013-12-28 LAB — BASIC METABOLIC PANEL
BUN: 8 mg/dL (ref 6–23)
CHLORIDE: 88 meq/L — AB (ref 96–112)
CO2: 24 mEq/L (ref 19–32)
Calcium: 9.5 mg/dL (ref 8.4–10.5)
Creatinine, Ser: 0.82 mg/dL (ref 0.50–1.35)
GFR calc Af Amer: >90 mL/min (ref >90)
GFR calc non Af Amer: >90 mL/min (ref >90)
GLUCOSE: 315 mg/dL — AB (ref 70–99)
Potassium: 3.2 mEq/L — ABNORMAL LOW (ref 3.7–5.3)
Sodium: 135 mEq/L — ABNORMAL LOW (ref 137–147)

## 2013-12-28 LAB — GLUCOSE, CAPILLARY: Glucose-Capillary: 234 mg/dL — ABNORMAL HIGH (ref 70–99)

## 2013-12-28 MED ORDER — AMLODIPINE BESYLATE 5 MG PO TABS: 5.0000 mg | ORAL_TABLET | Freq: Every day | ORAL | Status: DC

## 2013-12-28 MED ORDER — AMLODIPINE BESYLATE 5 MG PO TABS
5.0000 mg | ORAL_TABLET | Freq: Every day | ORAL | Status: DC
  Administered 2013-12-28: 08:00:00 5 mg via ORAL
  Filled 2013-12-28: qty 1

## 2013-12-28 MED ORDER — HYDRALAZINE HCL 20 MG/ML IJ SOLN
10.0000 mg | Freq: Four times a day (QID) | INTRAMUSCULAR | Status: DC | PRN
  Administered 2013-12-28: 07:00:00 10 mg via INTRAVENOUS

## 2013-12-28 MED ORDER — ACETAMINOPHEN 325 MG PO TABS
650.0000 mg | ORAL_TABLET | Freq: Once | ORAL | Status: AC
  Administered 2013-12-28: 650 mg via ORAL

## 2013-12-28 MED ORDER — HYDRALAZINE HCL 20 MG/ML IJ SOLN
INTRAMUSCULAR | Status: AC
  Filled 2013-12-28: qty 1

## 2013-12-28 MED ORDER — METFORMIN HCL 1000 MG PO TABS: 1000.0000 mg | ORAL_TABLET | Freq: Two times a day (BID) | ORAL | Status: AC

## 2013-12-28 MED FILL — Sodium Chloride IV Soln 0.9%: INTRAVENOUS | Qty: 50 | Status: AC

## 2013-12-28 NOTE — Progress Notes (Signed)
TR BAND REMOVAL  LOCATION:  right radial  DEFLATED PER PROTOCOL:  yes  TIME BAND OFF / DRESSING APPLIED: 2145   SITE UPON ARRIVAL:   Level 0  SITE AFTER BAND REMOVAL:  Level 0  REVERSE ALLEN'S TEST:    positive  CIRCULATION SENSATION AND MOVEMENT:  Within Normal Limits  yes  COMMENTS:    

## 2013-12-28 NOTE — Discharge Instructions (Signed)

## 2013-12-28 NOTE — Telephone Encounter (Signed)
Message copied by Marilynne HalstedMOODY, Sakari Alkhatib R on Thu Dec 28, 2013 11:55 AM ------      Message from: Christell FaithEKELY, TRACI L      Created: Thu Dec 28, 2013  8:47 AM      Regarding: tcm/ph       Dr. Kirke CorinArida 01/11/14 @ 11:30 ------

## 2013-12-28 NOTE — Telephone Encounter (Signed)
Message copied by Marilynne HalstedMOODY, Rivkah Wolz R on Thu Dec 28, 2013  4:47 PM ------      Message from: Christell FaithEKELY, TRACI L      Created: Thu Dec 28, 2013  8:47 AM      Regarding: tcm/ph       Dr. Kirke CorinArida 01/11/14 @ 11:30 ------

## 2013-12-28 NOTE — Discharge Summary (Signed)
Pt. Seen & examined along with Mr. Brion AlimentBerge, NP this AM.  BP notably elevated o/n - Amlodipine added.   No angina.    Stable for d/c post ambulation.  Exam: BP 136/78  Pulse 71  Temp(Src) 97.8 F (36.6 C) (Oral)  Resp 18  Ht 5\' 8"  (1.727 m)  Wt 233 lb 11 oz (106 kg)  BMI 35.54 kg/m2  SpO2 98% General appearance: alert, cooperative and appears stated age Neck: no adenopathy, no carotid bruit, no JVD and supple, symmetrical, trachea midline Lungs: clear to auscultation bilaterally and normal percussion bilaterally Heart: regular rate and rhythm, S1, S2 normal, no murmur, click, rub or gallop Abdomen: soft, non-tender; bowel sounds normal; no masses,  no organomegaly Extremities: extremities normal, atraumatic, no cyanosis or edema Pulses: 2+ and symmetric - R wrist stable.  I agree with the d/c summary.   Marykay LexHARDING,DAVID W, M.D., M.S. Interventional Cardiologist [facility]  Pager # [phone] 12/28/2013 2:13 PM     .

## 2013-12-28 NOTE — Telephone Encounter (Signed)
Attempted to [facility] on 12/[facility]/13. Left detailed message asking pt to call w/ any questions or concerns about discharge instructions &/or medications.  Also advised pt that he has an appt w/ Dr. Kirke CorinArida 2/261/15 @ 11:30.

## 2013-12-28 NOTE — Discharge Summary (Signed)
Discharge Summary   Patient ID: Larry Riggs,  MRN: [id number], DOB/AGE: 12/24/1955 58 y.o.  Admit date: 12/27/2013 Discharge date: 12/28/2013  [facility] Primary Cardiologist: M. Kirke CorinArida, MD   Discharge Diagnoses Principal Problem:   Angina, class III Active Problems:   Coronary atherosclerosis of native coronary vessel   Hypertension   Type II diabetes mellitus   Hyperlipidemia   Asthma  Allergies No Known Allergies  Procedures  Cardiac Catheterization and Percutaneous Coronary Intervention 2.11.2015  Hemodynamics: AO:  157/94   mmHg LV:  159/8    mmHg LVEDP: 15  mmHg  Coronary angiography: Coronary dominance: Right     Left Main:  Mildly calcified and normal.  Left Anterior Descending (LAD):  Heavily calcified throughout its course. There is diffuse 80% stenosis in the midsegment followed by 95% stenosis in the distal segment. The whole distal LAD is diffusely diseased and subtotally occluded.  **The distal LAD was unable to be successfully treated with balloon angioplasty secondary severe diffuse disease.  The mid LAD was successfully treated with balloon angioplasty.   1st diagonal (D1):  Very large in size with 95% ostial stenosis.   ** The D1 was successfully stented using a 2.75 x 16 mm Promus drug-eluting stent.   2nd diagonal (D2):  Normal in size with 90% ostial stenosis.  3rd diagonal (D3):  Medium in size with 80-90% ostial stenosis. There is a 4th diagonal which is medium in size with 60% ostial stenosis.  Circumflex (LCx):  Small in size with 90% proximal stenosis. Most of the posterior lateral wall is actually supplied by the first diagonal.  Right Coronary Artery: Normal in size and dominant. There is mild ectasia in the proximal and midsegment with 20% mid stenosis.  Posterior descending artery: Normal in size with minor irregularities.  Posterior AV segment: Normal in size with minor irregularities.  Posterolateral  branchs:  PL1 is small in size with diffuse 60% disease. PL 2 is normal in size with 90% ostial stenosis.  Left ventriculography: Was not performed  Final Conclusions:  1. Significant three-vessel coronary artery disease with diffuse and distally occluded LAD.   2.  unsuccessful distal LAD angioplasty due to a diffuse disease. Successful balloon angioplasty of the mid LAD without stent placement 3. Successful drug-eluting stent placement to the ostial diagonal  Recommendations:  Dual antiplatelet therapy for at least 12 months. _____________   History of Present Illness  58 year old male with prior history of coronary artery disease status post abnormal stress testing October 2014. Catheterization was subsequently performed revealing severe LAD and diagonal disease. Patient was referred to [facility] consideration of coronary artery bypass grafting however in the absence of good LAD targets, PCI was recommended. Medical therapy was implemented the patient continued to have dyspnea with minimal activity. He was referred to Dr. Kirke CorinArida on January 31, and following review of films, it was felt that percutaneous intervention was warranted.  Hospital Course  Patient presented to the [facility] catheter[facility] laboratory on February 11 and underwent diagnostic cardiac catheterization revealing severe diffuse LAD disease as well as severe disease in the ostial first diagonal. Patient also had disease within the smaller diagonal branches and left circumflex. Was felt that the LAD and first diagonal with the most likely culprit for his symptoms and therefore intervention was performed in these areas. Attempts were made to balloon the distal LAD however these attempts were ultimately unsuccessful. The mid LAD was successfully treated with balloon angioplasty. The first diagonal was able to  be successfully stented using a 2.75 by 16mm Promus drug-eluting stent. Patient tolerated procedure well and post procedure  has been ambulating without recurrent symptoms or limitations. He will be discharged home today in good condition.  Discharge Vitals Blood pressure 163/100, pulse 71, temperature 97.8 F (36.6 C), temperature source Oral, resp. rate 18, height 5\' 8"  (1.727 m), weight 233 lb 11 oz (106 kg), SpO2 98.00%.  Filed Weights   12/27/13 1153 12/28/13 0004  Weight: 232 lb (105.235 kg) 233 lb 11 oz (106 kg)   Labs  CBC  Recent Labs  12/28/13 0342  WBC 7.2  HGB 13.3  HCT 37.3*  MCV 80.9  PLT 168   Disposition  Pt is being discharged home today in good condition.  Follow-up Plans & Appointments      Follow-up Information   Follow up with [facility], MD. (as scheduled.)    Specialty:  Internal Medicine   Contact information:   [address]a [address]   [place] [address] [phone]       Follow up with Lorine Bears, MD On 01/11/2014. (11:30 AM)    Specialty:  Cardiology   Contact information:   [facility] [address] [place] [address] [phone]       Discharge Medications    Medication List         albuterol 108 (90 BASE) MCG/ACT inhaler  Commonly known as:  PROVENTIL HFA;VENTOLIN HFA  Inhale 1 puff into the lungs 3 (three) times daily.     amLODipine 5 MG tablet  Commonly known as:  NORVASC  Take 1 tablet (5 mg total) by mouth daily.     aspirin 81 MG tablet  Take 81 mg by mouth daily.     atorvastatin 40 MG tablet  Commonly known as:  LIPITOR  Take 40 mg by mouth daily.     clopidogrel 75 MG tablet  Commonly known as:  PLAVIX  Take 1 tablet (75 mg total) by mouth daily.     DULoxetine 60 MG capsule  Commonly known as:  CYMBALTA  Take 60 mg by mouth daily.     glipiZIDE 5 MG 24 hr tablet  Commonly known as:  GLUCOTROL XL  Take 5 mg by mouth 2 (two) times daily.     lisinopril 40 MG tablet  Commonly known as:  PRINIVIL,ZESTRIL  Take 40 mg by mouth daily.     metFORMIN 1000 MG tablet  Commonly known as:   GLUCOPHAGE  Take 1 tablet (1,000 mg total) by mouth 2 (two) times daily with a meal.     metoprolol 50 MG tablet  Commonly known as:  LOPRESSOR  Take 50 mg by mouth 2 (two) times daily.     RISPERDAL 4 MG tablet  Generic drug:  risperidone  Take 4 mg by mouth daily.     sitaGLIPtin 100 MG tablet  Commonly known as:  JANUVIA  Take 100 mg by mouth daily.     traZODone 50 MG tablet  Commonly known as:  DESYREL  Take 50 mg by mouth at bedtime.       Outstanding Labs/Studies  None  Duration of Discharge Encounter   Greater than 30 minutes including physician time.  Signed, Nicolasa Ducking NP 12/28/2013, 9:58 AM

## 2013-12-28 NOTE — Progress Notes (Signed)
CARDIAC REHAB PHASE I   PRE:  Rate/Rhythm: 70SR  BP:  Supine:   Sitting: 167/86  Standing:    SaO2:   MODE:  Ambulation: 500 ft   POST:  Rate/Rhythm: 85SR  BP:  Supine:   Sitting: 182/106 dinamapp, 162/92 maually  Standing:    SaO2:  [id number] Pt walked 500 ft with minimal asst . Gait steady. No CP. Tolerated well. Education completed with pt who voiced understanding. Left written diet and exercise for care giver to review also. BP at 162/92 manually after walk. Encouraged pt to watch salt. He does not use shaker. Declined CRP 2 as he stated it would be too far for transportation. Will walk on his own for ex.   Luetta Nuttingharlene Kissa Campoy, RN BSN  12/28/2013 9:39 AM

## 2014-01-11 ENCOUNTER — Encounter: Payer: Medicare Other | Admitting: Cardiovascular Disease

## 2014-01-18 ENCOUNTER — Other Ambulatory Visit: Payer: Self-pay

## 2014-01-18 ENCOUNTER — Ambulatory Visit: Payer: Federal, State, Local not specified - PPO | Admitting: Podiatry

## 2014-01-18 MED ORDER — CLOPIDOGREL BISULFATE 75 MG PO TABS: 75.0000 mg | ORAL_TABLET | Freq: Every day | ORAL | Status: DC

## 2014-01-18 MED ORDER — AMLODIPINE BESYLATE 5 MG PO TABS: 5.0000 mg | ORAL_TABLET | Freq: Every day | ORAL | Status: DC

## 2014-01-25 ENCOUNTER — Telehealth: Payer: Self-pay | Admitting: *Deleted

## 2014-01-25 NOTE — Telephone Encounter (Signed)
I have not seen this patient after stent done last month. I don't know what's going on with him. Aspirin and Plavix can not be stopped now. Schedule a follow up visit.

## 2014-01-25 NOTE — Telephone Encounter (Signed)
Scheduled follow up for patient on 02/05/14. Will contact Candice NP at [phone] (cell) or [phone] (office) after follow up visit to give her information requested for EGD/Colonoscopy clearance.

## 2014-01-25 NOTE — Telephone Encounter (Signed)
Patients GI PA called and they are recommending EGD and Colonoscopy. He had a recent stent placed.  They would like to know how to manage plavix.

## 2014-02-05 ENCOUNTER — Ambulatory Visit (INDEPENDENT_AMBULATORY_CARE_PROVIDER_SITE_OTHER): Payer: Medicare Other | Admitting: Cardiovascular Disease

## 2014-02-05 ENCOUNTER — Encounter: Payer: Self-pay | Admitting: Cardiovascular Disease

## 2014-02-05 VITALS — BP 103/73 | HR 67 | Ht 68.0 in | Wt 233.0 lb

## 2014-02-05 DIAGNOSIS — E785 Hyperlipidemia, unspecified: Secondary | ICD-10-CM

## 2014-02-05 DIAGNOSIS — I10 Essential (primary) hypertension: Secondary | ICD-10-CM

## 2014-02-05 DIAGNOSIS — Z01818 Encounter for other preprocedural examination: Secondary | ICD-10-CM

## 2014-02-05 DIAGNOSIS — I209 Angina pectoris, unspecified: Secondary | ICD-10-CM

## 2014-02-05 DIAGNOSIS — I2510 Atherosclerotic heart disease of native coronary artery without angina pectoris: Secondary | ICD-10-CM

## 2014-02-05 NOTE — Patient Instructions (Signed)
Continue same medications. We are not able to stop Aspirin or Plavix until February of 2016.   Your physician wants you to follow-up in: 6 months.  You will receive a reminder letter in the mail two months in advance. If you don't receive a letter, please call our office to schedule the follow-up appointment.

## 2014-02-05 NOTE — Assessment & Plan Note (Signed)
BP is well controlled 

## 2014-02-05 NOTE — Assessment & Plan Note (Signed)
He is doing better after recent LAD angioplasty and drug eluting stent placement to first diagonal. Treat the rest of CAD medically.  Continue dual antiplatelet therapy which can not be interrupted for at least 12 months.

## 2014-02-05 NOTE — Progress Notes (Signed)
Primary care physician: Dr. Dario GuardianJadali.   HPI  This is a 58 year old man who is here today for followup visit regarding  coronary artery disease. He has known history of type 2 diabetes, hypertension, hyperlipidemia and obesity. He has known history of mild mental retardation and currently lives in a group home. He makes his own decisions. In October of 2014, he underwent a nuclear stress test due to significant exertional dyspnea. The stress test was abnormal and thus he was referred for cardiac catheterization which was done by Dr. Welton FlakesKhan. It showed significant three-vessel coronary artery disease. [facility] was [facility] to [facility]G. However, the patient had no good targets in the LAD. The RCA disease was also in the small branches. Thus, he was felt to be not a candidate for CABG. Medical therapy versus PCI was recommended.  He continued to have significant exertional dyspnea. Thus, I proceeded with cardiac catheterization which showed significant three-vessel coronary artery disease. The LAD was diffusely diseased and occluded distally. The left circumflex had 90% proximal stenosis but was small in size. There was significant disease in the posterolateral branches of the right coronary artery. I attempted PCI of the LAD but there was significant diffuse disease and the vessel was occluded distally. I performed balloon angioplasty without stent placement with significant residual disease. I placed a drug-eluting stent to the ostial first diagonal. He is doing well with no chest pain or dyspnea. He needs a routine colonoscopy.   No Known Allergies   Current Outpatient Prescriptions on File Prior to Visit  Medication Sig Dispense Refill  . albuterol (PROVENTIL HFA;VENTOLIN HFA) 108 (90 BASE) MCG/ACT inhaler Inhale 1 puff into the lungs 3 (three) times daily.       Marland Kitchen. amLODipine (NORVASC) 5 MG tablet Take 1 tablet (5 mg total) by mouth daily.  90 tablet  3  . aspirin 81 MG tablet Take 81 mg by mouth daily.       Marland Kitchen. atorvastatin (LIPITOR) 40 MG tablet Take 40 mg by mouth daily.      . clopidogrel (PLAVIX) 75 MG tablet Take 1 tablet (75 mg total) by mouth daily.  90 tablet  3  . DULoxetine (CYMBALTA) 60 MG capsule Take 60 mg by mouth daily.      Marland Kitchen. glipiZIDE (GLUCOTROL XL) 5 MG 24 hr tablet Take 5 mg by mouth 2 (two) times daily.      Marland Kitchen. lisinopril (PRINIVIL,ZESTRIL) 40 MG tablet Take 40 mg by mouth daily.      . metFORMIN (GLUCOPHAGE) 1000 MG tablet Take 1 tablet (1,000 mg total) by mouth 2 (two) times daily with a meal.      . metoprolol (LOPRESSOR) 50 MG tablet Take 50 mg by mouth 2 (two) times daily.      . risperidone (RISPERDAL) 4 MG tablet Take 4 mg by mouth daily.      . sitaGLIPtin (JANUVIA) 100 MG tablet Take 100 mg by mouth daily.      . traZODone (DESYREL) 50 MG tablet Take 50 mg by mouth at bedtime.       No current facility-administered medications on file prior to visit.     Past Medical History  Diagnosis Date  . Obesity   . Mildly mentally retarded   . Coronary atherosclerosis of native coronary vessel     a. 08/2013 Cath: signif 3V dzs w EF of 55%, eval @ [facility] for CABG but felt that there was no good target in the LAD;  b. 12/2013  Cath/PCI: LM nl, LAD 78m (PTCA), 95d (attempted PTCA), subtl distal, D1 95 (2.75x16 Promus DES), D2 90ost, D3 80-90ost, D4 60ost, LCX 90p, small, RCA 27m, PDA nl, RPL1 60, RPL2 90ost.  . Hyperlipidemia   . Hypertension   . Asthma   . Type II diabetes mellitus      Past Surgical History  Procedure Laterality Date  . Inguinal hernia repair Right 1990's  . Cardiac catheterization  08/2013    [facility]  . Coronary angioplasty with stent placement  12/27/2013    "1"     Family History  Problem Relation Age of Onset  . Hypertension Father   . Diabetes Father      History   Social History  . Marital Status: Single    Spouse Name: N/A    Number of Children: N/A  . Years of Education: N/A   Occupational History  . Not on file.   Social  History Main Topics  . Smoking status: Never Smoker   . Smokeless tobacco: Never Used  . Alcohol Use: No  . Drug Use: No  . Sexual Activity: No   Other Topics Concern  . Not on file   Social History Narrative  . No narrative on file     ROS A 10 point review of system was performed. It is negative other than that mentioned in the history of present illness.   PHYSICAL EXAM   BP 103/73  Ht 5\' 8"  (1.727 m)  Wt 233 lb (105.688 kg)  BMI 35.44 kg/m2 Constitutional: He is oriented to person, place, and time. He appears well-developed and well-nourished. No distress.  HENT: No nasal discharge.  Head: Normocephalic and atraumatic.  Eyes: Pupils are equal and round.  No discharge. Neck: Normal range of motion. Neck supple. No JVD present. No thyromegaly present.  Cardiovascular: Normal rate, regular rhythm, normal heart sounds. Exam reveals no gallop and no friction rub. No murmur heard.  Pulmonary/Chest: Effort normal and breath sounds normal. No stridor. No respiratory distress. He has no wheezes. He has no rales. He exhibits no tenderness.  Abdominal: Soft. Bowel sounds are normal. He exhibits no distension. There is no tenderness. There is no rebound and no guarding.  Musculoskeletal: Normal range of motion. He exhibits no edema and no tenderness.  Neurological: He is alert and oriented to person, place, and time. Coordination normal.  Skin: Skin is warm and dry. No rash noted. He is not diaphoretic. No erythema. No pallor.  Psychiatric: He has a normal mood and affect. His behavior is normal. Judgment and thought content normal.  Right radial pulse is normal.      EKG: Normal sinus rhythm with incomplete right bundle branch block left anterior fascicular block and previous septal and inferior infarcts. Inferior TW changes.    ASSESSMENT AND PLAN

## 2014-02-05 NOTE — Assessment & Plan Note (Signed)
Continue Atorvastatin with a target LDL <70.  

## 2014-02-07 NOTE — Telephone Encounter (Signed)
Informed NP that per Dr. Sheilah PigeonAridas note "Continue same medications. We are not able to stop Aspirin or Plavix until February of 2016". She verbalized understanding.

## 2014-02-14 ENCOUNTER — Ambulatory Visit: Payer: Self-pay

## 2014-02-14 ENCOUNTER — Telehealth: Payer: Self-pay | Admitting: Cardiovascular Disease

## 2014-02-14 NOTE — Telephone Encounter (Signed)
Contacted by Dr. Veneta PentonJadhali to discuss patient on Wednesday, 02/14/2014 Patient had recent coughing with hemoptysis CBC was checked and there was no significant change After long discussion, It was recommended that he stay on his aspirin and Plavix with close monitoring of his symptoms. Does get worse, may need to additional workup whether that includes bronchoscopy or ENT evaluation. Unable to exclude posterior pharynx bleed.

## 2014-02-19 ENCOUNTER — Ambulatory Visit: Payer: Medicare Other | Admitting: Cardiovascular Disease

## 2014-02-21 ENCOUNTER — Ambulatory Visit (INDEPENDENT_AMBULATORY_CARE_PROVIDER_SITE_OTHER): Payer: Medicare Other | Admitting: Podiatry

## 2014-02-21 ENCOUNTER — Encounter: Payer: Self-pay | Admitting: Podiatry

## 2014-02-21 VITALS — BP 138/89 | HR 84 | Resp 16 | Ht 68.0 in | Wt 230.0 lb

## 2014-02-21 DIAGNOSIS — E119 Type 2 diabetes mellitus without complications: Secondary | ICD-10-CM

## 2014-02-21 DIAGNOSIS — M79609 Pain in unspecified limb: Secondary | ICD-10-CM

## 2014-02-21 DIAGNOSIS — B351 Tinea unguium: Secondary | ICD-10-CM

## 2014-02-21 NOTE — Progress Notes (Signed)
He presents today with a chief complaint of painful elongated toenails bilateral.  Objective: Vital signs are stable he is alert and oriented x3 pulses are palpable bilateral. Nails are thick yellow dystrophic clinically mycotic and painful palpation.  Assessment: Diabetes mellitus non-complicated nails thick yellow dystrophic with mycotic and painful palpation.

## 2014-03-19 ENCOUNTER — Ambulatory Visit: Payer: Medicare Other | Admitting: Cardiovascular Disease

## 2014-03-30 ENCOUNTER — Ambulatory Visit: Payer: Medicare Other | Admitting: Cardiovascular Disease

## 2014-04-02 ENCOUNTER — Ambulatory Visit: Payer: Medicare Other | Admitting: Cardiovascular Disease

## 2014-04-12 ENCOUNTER — Encounter: Payer: Self-pay | Admitting: Cardiovascular Disease

## 2014-04-12 ENCOUNTER — Ambulatory Visit (INDEPENDENT_AMBULATORY_CARE_PROVIDER_SITE_OTHER): Payer: Medicare Other | Admitting: Cardiovascular Disease

## 2014-04-12 VITALS — BP 112/78 | HR 75 | Ht 68.0 in | Wt 227.8 lb

## 2014-04-12 DIAGNOSIS — E785 Hyperlipidemia, unspecified: Secondary | ICD-10-CM

## 2014-04-12 DIAGNOSIS — I2510 Atherosclerotic heart disease of native coronary artery without angina pectoris: Secondary | ICD-10-CM

## 2014-04-12 DIAGNOSIS — I209 Angina pectoris, unspecified: Secondary | ICD-10-CM

## 2014-04-12 DIAGNOSIS — I10 Essential (primary) hypertension: Secondary | ICD-10-CM

## 2014-04-12 NOTE — Progress Notes (Signed)
Primary care physician: Dr. Dario GuardianJadali.   HPI  This is a 58 year old man who is here today for followup visit regarding  coronary artery disease. He has known history of type 2 diabetes, hypertension, hyperlipidemia and obesity. He has known history of mild mental retardation and currently lives in a group home. He makes his own decisions. In October of 2014, he underwent a nuclear stress test due to significant exertional dyspnea. The stress test was abnormal and thus he was referred for cardiac catheterization which was done by Dr. Welton FlakesKhan. It showed significant three-vessel coronary artery disease. He was [facility]. However, the patient had no good targets in the LAD. The RCA disease was also in the small branches. Thus, he was felt to be not a candidate for CABG. Medical therapy versus PCI was recommended.  He continued to have significant exertional dyspnea. Thus, I proceeded with cardiac catheterization which showed significant three-vessel coronary artery disease. The LAD was diffusely diseased and occluded distally. The left circumflex had 90% proximal stenosis but was small in size. There was significant disease in the posterolateral branches of the right coronary artery. I attempted PCI of the LAD but there was significant diffuse disease and the vessel was occluded distally. I performed balloon angioplasty without stent placement with significant residual disease. I placed a drug-eluting stent to the ostial first diagonal. He is doing well with no chest pain or dyspnea. He has been coughing brownish phlegm especially in the morning but that has been no blood noted. CBC showed a hemoglobin of 14.3. Chest x-ray showed no acute abnormalities.  No Known Allergies   Current Outpatient Prescriptions on File Prior to Visit  Medication Sig Dispense Refill  . albuterol (PROVENTIL HFA;VENTOLIN HFA) 108 (90 BASE) MCG/ACT inhaler Inhale 1 puff into the lungs 3 (three) times daily.       Marland Kitchen.  amLODipine (NORVASC) 5 MG tablet Take 1 tablet (5 mg total) by mouth daily.  90 tablet  3  . aspirin 81 MG tablet Take 81 mg by mouth daily.      Marland Kitchen. atorvastatin (LIPITOR) 40 MG tablet Take 40 mg by mouth daily.      . clopidogrel (PLAVIX) 75 MG tablet Take 1 tablet (75 mg total) by mouth daily.  90 tablet  3  . DULoxetine (CYMBALTA) 60 MG capsule Take 60 mg by mouth daily.      Marland Kitchen. glipiZIDE (GLUCOTROL XL) 5 MG 24 hr tablet Take 5 mg by mouth 2 (two) times daily.      Marland Kitchen. lisinopril (PRINIVIL,ZESTRIL) 40 MG tablet Take 40 mg by mouth daily.      . metFORMIN (GLUCOPHAGE) 1000 MG tablet Take 1 tablet (1,000 mg total) by mouth 2 (two) times daily with a meal.      . metoprolol (LOPRESSOR) 50 MG tablet Take 50 mg by mouth 2 (two) times daily.      . risperidone (RISPERDAL) 4 MG tablet Take 4 mg by mouth daily.      . sitaGLIPtin (JANUVIA) 100 MG tablet Take 100 mg by mouth daily.      . traZODone (DESYREL) 50 MG tablet Take 50 mg by mouth at bedtime.       No current facility-administered medications on file prior to visit.     Past Medical History  Diagnosis Date  . Obesity   . Mildly mentally retarded   . Coronary atherosclerosis of native coronary vessel     a. 08/2013 Cath: signif 3V dzs  w EF of 55%, eval @ [facility] for CABG but felt that there was no good target in the LAD;  b. 12/2013 Cath/PCI: LM nl, LAD 18m (PTCA), 95d (attempted PTCA), subtl distal, D1 95 (2.75x16 Promus DES), D2 90ost, D3 80-90ost, D4 60ost, LCX 90p, small, RCA 54m, PDA nl, RPL1 60, RPL2 90ost.  . Hyperlipidemia   . Hypertension   . Asthma   . Type II diabetes mellitus   . GERD (gastroesophageal reflux disease)   . Dysphagia      Past Surgical History  Procedure Laterality Date  . Inguinal hernia repair Right 1990's  . Cardiac catheterization  08/2013    [facility]  . Coronary angioplasty with stent placement  12/27/2013    "1"     Family History  Problem Relation Age of Onset  . Hypertension Father   . Diabetes  Father      History   Social History  . Marital Status: Single    Spouse Name: N/A    Number of Children: N/A  . Years of Education: N/A   Occupational History  . Not on file.   Social History Main Topics  . Smoking status: Never Smoker   . Smokeless tobacco: Never Used  . Alcohol Use: No  . Drug Use: No  . Sexual Activity: No   Other Topics Concern  . Not on file   Social History Narrative  . No narrative on file     ROS A 10 point review of system was performed. It is negative other than that mentioned in the history of present illness.   PHYSICAL EXAM   BP 112/78  Pulse 75  Ht 5\' 8"  (1.727 m)  Wt 227 lb 12 oz (103.307 kg)  BMI 34.64 kg/m2 Constitutional: He is oriented to person, place, and time. He appears well-developed and well-nourished. No distress.  HENT: No nasal discharge.  Head: Normocephalic and atraumatic.  Eyes: Pupils are equal and round.  No discharge. Neck: Normal range of motion. Neck supple. No JVD present. No thyromegaly present.  Cardiovascular: Normal rate, regular rhythm, normal heart sounds. Exam reveals no gallop and no friction rub. No murmur heard.  Pulmonary/Chest: Effort normal and breath sounds normal. No stridor. No respiratory distress. He has no wheezes. He has no rales. He exhibits no tenderness.  Abdominal: Soft. Bowel sounds are normal. He exhibits no distension. There is no tenderness. There is no rebound and no guarding.  Musculoskeletal: Normal range of motion. He exhibits no edema and no tenderness.  Neurological: He is alert and oriented to person, place, and time. Coordination normal.  Skin: Skin is warm and dry. No rash noted. He is not diaphoretic. No erythema. No pallor.  Psychiatric: He has a normal mood and affect. His behavior is normal. Judgment and thought content normal.       ASSESSMENT AND PLAN

## 2014-04-12 NOTE — Patient Instructions (Signed)
Continue same medications.   Your physician wants you to follow-up in: 6 months.  You will receive a reminder letter in the mail two months in advance. If you don't receive a letter, please call our office to schedule the follow-up appointment.  

## 2014-04-14 ENCOUNTER — Encounter: Payer: Self-pay | Admitting: Cardiovascular Disease

## 2014-04-14 NOTE — Assessment & Plan Note (Signed)
Blood pressure is well controlled on current medications. 

## 2014-04-14 NOTE — Assessment & Plan Note (Signed)
Continue treatment with atorvastatin with a target LDL of less than 70. 

## 2014-04-14 NOTE — Assessment & Plan Note (Signed)
He is doing well overall with no symptoms suggestive of angina. Continue medical therapy including dual antiplatelet therapy. It does not appear that he is having hemoptysis. Continue to monitor symptoms. Hemoglobin was stable.

## 2014-06-20 ENCOUNTER — Ambulatory Visit: Payer: Self-pay | Admitting: Podiatry

## 2014-09-24 ENCOUNTER — Ambulatory Visit (INDEPENDENT_AMBULATORY_CARE_PROVIDER_SITE_OTHER): Payer: Medicare Other | Admitting: Podiatry

## 2014-09-24 DIAGNOSIS — M79676 Pain in unspecified toe(s): Secondary | ICD-10-CM

## 2014-09-24 DIAGNOSIS — B351 Tinea unguium: Secondary | ICD-10-CM

## 2014-09-24 NOTE — Progress Notes (Signed)
He presents today chief complaint of painful elongated toenails 1 through 5 bilateral.  Objective: Vital signs are stable he is alert and oriented 3. Pulses are palpable bilateral. Nails are thick yellow dystrophic onychomycotic and painful on palpation.  Assessment: Pain in limb secondary to onychomycosis 1 through 5 bilateral.  Plan: Debridement of nails 1 through 5 bilateral cover service secondary to pain. And we will schedule him to be seen in 3 months.

## 2014-10-25 ENCOUNTER — Encounter (HOSPITAL_COMMUNITY): Payer: Self-pay | Admitting: Cardiovascular Disease

## 2014-12-31 ENCOUNTER — Ambulatory Visit: Payer: Medicare Other

## 2014-12-31 ENCOUNTER — Ambulatory Visit: Payer: Federal, State, Local not specified - PPO | Admitting: Podiatry

## 2015-01-14 ENCOUNTER — Other Ambulatory Visit: Payer: Self-pay | Admitting: Cardiovascular Disease

## 2015-01-21 ENCOUNTER — Ambulatory Visit: Payer: Medicare Other

## 2015-01-24 ENCOUNTER — Ambulatory Visit: Payer: Medicare Other | Admitting: Podiatry

## 2015-01-31 ENCOUNTER — Ambulatory Visit: Payer: Medicare Other | Admitting: Podiatry

## 2015-02-14 ENCOUNTER — Encounter: Payer: Self-pay | Admitting: Podiatry

## 2015-02-14 ENCOUNTER — Ambulatory Visit (INDEPENDENT_AMBULATORY_CARE_PROVIDER_SITE_OTHER): Payer: Medicare Other | Admitting: Podiatry

## 2015-02-14 DIAGNOSIS — B351 Tinea unguium: Secondary | ICD-10-CM | POA: Diagnosis not present

## 2015-02-14 DIAGNOSIS — M79676 Pain in unspecified toe(s): Secondary | ICD-10-CM | POA: Diagnosis not present

## 2015-02-14 NOTE — Progress Notes (Signed)
Patient ID: Jyl HeinzKent H Welborn, male   DOB: 07/16/1956, 59 y.o.   MRN: [id number]  Subjective: 59 y.o.-year-old male returns the office today for painful, elongated, thickened toenails which he is unable to trim himself. Denies any redness or drainage around the nails. Denies any acute changes since last appointment and no new complaints today. Denies any systemic complaints such as fevers, chills, nausea, vomiting. States his blood sugar typically runs between 150-160.   Objective: AAO 3, NAD DP/PT pulses palpable, CRT less than 3 seconds Protective sensation intact with Simms Weinstein monofilament, Achilles tendon reflex intact.  Nails hypertrophic, dystrophic, elongated, brittle, discolored 10. There is tenderness overlying the nails 1-5 bilaterally. There is no surrounding erythema or drainage along the nail sites. No open lesions or pre-ulcerative lesions are identified bilaterally.  No other areas of tenderness bilateral lower extremities. No overlying edema, erythema, increased warmth. No pain with calf compression, swelling, warmth, erythema.  Assessment: Patient presents with symptomatic onychomycosis  Plan: -Treatment options including alternatives, risks, complications were discussed -Nails sharply debrided 10 without complication/bleeding. -Discussed daily foot inspection. If there are any changes, to call the office immediately.  -Follow-up in 3 months or sooner if any problems are to arise. In the meantime, encouraged to call the office with any questions, concerns, changes symptoms.

## 2015-02-14 NOTE — Patient Instructions (Signed)
Diabetes and Foot Care Diabetes may cause you to have problems because of poor blood supply (circulation) to your feet and legs. This may cause the skin on your feet to become thinner, break easier, and heal more slowly. Your skin may become dry, and the skin may peel and crack. You may also have nerve damage in your legs and feet causing decreased feeling in them. You may not notice minor injuries to your feet that could lead to infections or more serious problems. Taking care of your feet is one of the most important things you can do for yourself.  HOME CARE INSTRUCTIONS  Wear shoes at all times, even in the house. Do not go barefoot. Bare feet are easily injured.  Check your feet daily for blisters, cuts, and redness. If you cannot see the bottom of your feet, use a mirror or ask someone for help.  Wash your feet with warm water (do not use hot water) and mild soap. Then pat your feet and the areas between your toes until they are completely dry. Do not soak your feet as this can dry your skin.  Apply a moisturizing lotion or petroleum jelly (that does not contain alcohol and is unscented) to the skin on your feet and to dry, brittle toenails. Do not apply lotion between your toes.  Trim your toenails straight across. Do not dig under them or around the cuticle. File the edges of your nails with an emery board or nail file.  Do not cut corns or calluses or try to remove them with medicine.  Wear clean socks or stockings every day. Make sure they are not too tight. Do not wear knee-high stockings since they may decrease blood flow to your legs.  Wear shoes that fit properly and have enough cushioning. To break in new shoes, wear them for just a few hours a day. This prevents you from injuring your feet. Always look in your shoes before you put them on to be sure there are no objects inside.  Do not cross your legs. This may decrease the blood flow to your feet.  If you find a minor scrape,  cut, or break in the skin on your feet, keep it and the skin around it clean and dry. These areas may be cleansed with mild soap and water. Do not cleanse the area with peroxide, alcohol, or iodine.  When you remove an adhesive bandage, be sure not to damage the skin around it.  If you have a wound, look at it several times a day to make sure it is healing.  Do not use heating pads or hot water bottles. They may burn your skin. If you have lost feeling in your feet or legs, you may not know it is happening until it is too late.  Make sure your health care provider performs a complete foot exam at least annually or more often if you have foot problems. Report any cuts, sores, or bruises to your health care provider immediately. SEEK MEDICAL CARE IF:   You have an injury that is not healing.  You have cuts or breaks in the skin.  You have an ingrown nail.  You notice redness on your legs or feet.  You feel burning or tingling in your legs or feet.  You have pain or cramps in your legs and feet.  Your legs or feet are numb.  Your feet always feel cold. SEEK IMMEDIATE MEDICAL CARE IF:   There is increasing redness,   swelling, or pain in or around a wound.  There is a red line that goes up your leg.  Pus is coming from a wound.  You develop a fever or as directed by your health care provider.  You notice a bad smell coming from an ulcer or wound. Document Released: 10/30/2000 Document Revised: 07/05/2013 Document Reviewed: 04/11/2013 Exit[employer] Patient Information 2015 [employer]. This information is not intended to replace advice given to you by your health care provider. Make sure you discuss any questions you have with your health care provider.  

## 2015-02-26 ENCOUNTER — Other Ambulatory Visit: Payer: Self-pay | Admitting: Cardiovascular Disease

## 2015-03-28 ENCOUNTER — Encounter: Payer: Self-pay | Admitting: Cardiovascular Disease

## 2015-03-28 ENCOUNTER — Ambulatory Visit (INDEPENDENT_AMBULATORY_CARE_PROVIDER_SITE_OTHER): Payer: Medicare Other | Admitting: Cardiovascular Disease

## 2015-03-28 VITALS — BP 84/64 | HR 78 | Ht 68.0 in | Wt 227.0 lb

## 2015-03-28 DIAGNOSIS — I2510 Atherosclerotic heart disease of native coronary artery without angina pectoris: Secondary | ICD-10-CM

## 2015-03-28 DIAGNOSIS — I10 Essential (primary) hypertension: Secondary | ICD-10-CM | POA: Diagnosis not present

## 2015-03-28 DIAGNOSIS — E785 Hyperlipidemia, unspecified: Secondary | ICD-10-CM

## 2015-03-28 MED ORDER — AMLODIPINE BESYLATE 2.5 MG PO TABS: 2.5000 mg | ORAL_TABLET | Freq: Every day | ORAL | Status: DC

## 2015-03-28 NOTE — Assessment & Plan Note (Signed)
Blood pressure is low today. Thus, I decreased the dose of amlodipine to 2.5 mg once daily.

## 2015-03-28 NOTE — Assessment & Plan Note (Signed)
Continue treatment with atorvastatin with a target LDL of less than 70. 

## 2015-03-28 NOTE — Patient Instructions (Signed)
Medication Instructions:  Your physician has recommended you make the following change in your medication:  DECREASE Amlodipine 2.5mg  daily.  Labwork: None  Testing/Procedures: None  Follow-Up: Your physician wants you to follow-up in: SIX MONTHS with Dr. Kirke CorinArida. You will receive a reminder letter in the mail two months in advance. If you don't receive a letter, please call our office to schedule the follow-up appointment.   Any Other Special Instructions Will Be Listed Below (If Applicable).

## 2015-03-28 NOTE — Assessment & Plan Note (Signed)
He is doing reasonably well with no significant symptoms of angina. I recommend continuing medical therapy.

## 2015-03-28 NOTE — Progress Notes (Signed)
Primary care physician: Dr. Jadali.   HPI  This is a 59 year old man who is here today for followup visit regarding  corDario Guardianonary artery disease. He has known history of type 2 diabetes, hypertension, hyperlipidemia and obesity. He has known history of mild mental retardation and currently lives in a group home. He makes his own decisions. In October of 2014, he underwent a nuclear stress test due to significant exertional dyspnea. The stress test was abnormal and thus he was referred for cardiac catheterization which was done by Dr. Welton FlakesKhan. It showed significant three-vessel coronary artery disease. [facility]. However, the patient had no good targets in the LAD. The RCA disease was also in the small branches. Thus, he was felt to be not a candidate for CABG. Medical therapy versus PCI was recommended.  He continued to have significant exertional dyspnea. Thus, I proceeded with cardiac catheterization in February 2015 which showed significant three-vessel coronary artery disease. The LAD was diffusely diseased and occluded distally. The left circumflex had 90% proximal stenosis but was small in size. There was significant disease in the posterolateral branches of the right coronary artery. I attempted PCI of the LAD but there was significant diffuse disease and the vessel was occluded distally. I performed balloon angioplasty without stent placement with significant residual disease. I placed a drug-eluting stent to the ostial first diagonal. He is doing well with no chest pain or dyspnea. Blood pressure is mildly low but she denies dizziness, syncope or presyncope.  No Known Allergies   Current Outpatient Prescriptions on File Prior to Visit  Medication Sig Dispense Refill  . albuterol (PROVENTIL HFA;VENTOLIN HFA) 108 (90 BASE) MCG/ACT inhaler Inhale 1 puff into the lungs 3 (three) times daily as needed.     Marland Kitchen. amLODipine (NORVASC) 5 MG tablet TAKE 1 TABLET DAILY 90 tablet 1  .  aspirin 81 MG tablet Take 81 mg by mouth daily.    Marland Kitchen. atorvastatin (LIPITOR) 40 MG tablet Take 40 mg by mouth daily.    . clopidogrel (PLAVIX) 75 MG tablet TAKE 1 TABLET DAILY 90 tablet 3  . DULoxetine (CYMBALTA) 60 MG capsule Take 120 mg by mouth daily.     Marland Kitchen. glipiZIDE (GLUCOTROL XL) 5 MG 24 hr tablet Take 5 mg by mouth 2 (two) times daily.    Marland Kitchen. lisinopril (PRINIVIL,ZESTRIL) 40 MG tablet Take 40 mg by mouth daily.    . metFORMIN (GLUCOPHAGE) 1000 MG tablet Take 1 tablet (1,000 mg total) by mouth 2 (two) times daily with a meal.    . metoprolol (LOPRESSOR) 50 MG tablet Take 50 mg by mouth 2 (two) times daily.    . pantoprazole (PROTONIX) 40 MG tablet Take 40 mg by mouth daily.    . risperidone (RISPERDAL) 4 MG tablet Take 4 mg by mouth daily.    . sitaGLIPtin (JANUVIA) 100 MG tablet Take 100 mg by mouth daily.    . traZODone (DESYREL) 50 MG tablet Take 50 mg by mouth at bedtime.     No current facility-administered medications on file prior to visit.     Past Medical History  Diagnosis Date  . Obesity   . Mildly mentally retarded   . Coronary atherosclerosis of native coronary vessel     a. 08/2013 Cath: signif 3V dzs w EF of 55%, eval @ [facility] for CABG but felt that there was no good target in the LAD;  b. 12/2013 Cath/PCI: LM nl, LAD 4958m (PTCA), 95d (attempted PTCA),  subtl distal, D1 95 (2.75x16 Promus DES), D2 90ost, D3 80-90ost, D4 60ost, LCX 90p, small, RCA 7689m, PDA nl, RPL1 60, RPL2 90ost.  . Hyperlipidemia   . Hypertension   . Asthma   . Type II diabetes mellitus   . GERD (gastroesophageal reflux disease)   . Dysphagia      Past Surgical History  Procedure Laterality Date  . Inguinal hernia repair Right 1990's  . Cardiac catheterization  08/2013    [facility]  . Coronary angioplasty with stent placement  12/27/2013    "1"  . Percutaneous coronary stent intervention (pci-s) N/A 12/27/2013    Procedure: PERCUTANEOUS CORONARY STENT INTERVENTION (PCI-S);  Surgeon: Iran OuchMuhammad A Tameaka Eichhorn, MD;   [facility];  Service: Cardiovascular;  Laterality: N/A;     Family History  Problem Relation Age of Onset  . Hypertension Father   . Diabetes Father      History   Social History  . Marital Status: Single    Spouse Name: N/A  . Number of Children: N/A  . Years of Education: N/A   Occupational History  . Not on file.   Social History Main Topics  . Smoking status: Never Smoker   . Smokeless tobacco: Never Used  . Alcohol Use: No  . Drug Use: No  . Sexual Activity: No   Other Topics Concern  . Not on file   Social History Narrative     ROS A 10 point review of system was performed. It is negative other than that mentioned in the history of present illness.   PHYSICAL EXAM   BP 84/64 mmHg  Pulse 78  Ht 5\' 8"  (1.727 m)  Wt 227 lb (102.967 kg)  BMI 34.52 kg/m2 Constitutional: He is oriented to person, place, and time. He appears well-developed and well-nourished. No distress.  HENT: No nasal discharge.  Head: Normocephalic and atraumatic.  Eyes: Pupils are equal and round.  No discharge. Neck: Normal range of motion. Neck supple. No JVD present. No thyromegaly present.  Cardiovascular: Normal rate, regular rhythm, normal heart sounds. Exam reveals no gallop and no friction rub. No murmur heard.  Pulmonary/Chest: Effort normal and breath sounds normal. No stridor. No respiratory distress. He has no wheezes. He has no rales. He exhibits no tenderness.  Abdominal: Soft. Bowel sounds are normal. He exhibits no distension. There is no tenderness. There is no rebound and no guarding.  Musculoskeletal: Normal range of motion. He exhibits no edema and no tenderness.  Neurological: He is alert and oriented to person, place, and time. Coordination normal.  Skin: Skin is warm and dry. No rash noted. He is not diaphoretic. No erythema. No pallor.  Psychiatric: He has a normal mood and affect. His behavior is normal. Judgment and thought content normal.      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   -  Nonspecific T-abnormality.   ABNORMAL    ASSESSMENT AND PLAN

## 2015-05-30 ENCOUNTER — Ambulatory Visit: Payer: Medicare Other | Admitting: Podiatry

## 2015-06-18 ENCOUNTER — Ambulatory Visit: Payer: Medicare Other

## 2015-08-22 ENCOUNTER — Ambulatory Visit: Payer: Medicare Other

## 2015-08-27 ENCOUNTER — Ambulatory Visit: Payer: Medicare Other | Admitting: Sports Medicine

## 2015-09-10 ENCOUNTER — Ambulatory Visit: Payer: Medicare Other | Admitting: Sports Medicine

## 2015-11-12 ENCOUNTER — Ambulatory Visit: Payer: Medicare Other | Admitting: Sports Medicine

## 2015-11-15 ENCOUNTER — Telehealth: Payer: Self-pay | Admitting: *Deleted

## 2015-11-15 NOTE — Telephone Encounter (Signed)
Pt caretaker calling   *STAT* If patient is at the pharmacy, call can be transferred to refill team.   1. Which medications need to be refilled? (please list name of each medication and dose if known) Amlodipine needs to be sent in 2.5 instread of 5 mg   2. Which pharmacy/location (including street and city if local pharmacy) is medication to be sent to? [facility]    3. Do they need a 30 day or 90 day supply? 90 day

## 2015-11-19 ENCOUNTER — Other Ambulatory Visit: Payer: Self-pay | Admitting: *Deleted

## 2015-11-19 MED ORDER — AMLODIPINE BESYLATE 2.5 MG PO TABS: 2.5000 mg | ORAL_TABLET | Freq: Every day | ORAL | Status: AC

## 2015-11-19 NOTE — Telephone Encounter (Signed)
Requested Prescriptions   Signed Prescriptions Disp Refills  . amLODipine (NORVASC) 2.5 MG tablet 90 tablet 3    Sig: Take 1 tablet (2.5 mg total) by mouth daily.    Authorizing Provider: ARIDA, MUHAMMAD A    Ordering User: LOPEZ, MARINA C    

## 2015-12-03 ENCOUNTER — Encounter: Payer: Self-pay | Admitting: Sports Medicine

## 2015-12-03 ENCOUNTER — Ambulatory Visit (INDEPENDENT_AMBULATORY_CARE_PROVIDER_SITE_OTHER): Payer: Medicare Other | Admitting: Sports Medicine

## 2015-12-03 DIAGNOSIS — M79676 Pain in unspecified toe(s): Secondary | ICD-10-CM

## 2015-12-03 DIAGNOSIS — B351 Tinea unguium: Secondary | ICD-10-CM

## 2015-12-03 DIAGNOSIS — E119 Type 2 diabetes mellitus without complications: Secondary | ICD-10-CM

## 2015-12-03 NOTE — Progress Notes (Signed)
Patient ID: Larry Riggs, male   DOB: Feb 26, 1956, 60 y.o.   MRN: [id number] Subjective: Larry Riggs is a 60 y.o. male patient with history of type 2 diabetes who presents to office today complaining of long, painful nails  while ambulating in shoes; unable to trim. Patient states that the glucose reading this morning was 185 mg/dl. Patient denies any new changes in medication or new problems. Patient denies any new cramping, numbness, burning or tingling in the legs.  Patient Active Problem List   Diagnosis Date Noted  . Angina, class III (HCC) 12/28/2013  . Presence of drug coated stent (Promus Premier DES 2.75 x 16) in D1 branch of LAD coronary artery; PTCA of LAD 12/28/2013  . Type II diabetes mellitus (HCC)   . Asthma   . Angina effort (HCC) 12/27/2013  . Coronary atherosclerosis of native coronary vessel   . Hyperlipidemia   . Hypertension    Current Outpatient Prescriptions on File Prior to Visit  Medication Sig Dispense Refill  . albuterol (PROVENTIL HFA;VENTOLIN HFA) 108 (90 BASE) MCG/ACT inhaler Inhale 1 puff into the lungs 3 (three) times daily as needed.     Marland Kitchen amLODipine (NORVASC) 2.5 MG tablet Take 1 tablet (2.5 mg total) by mouth daily. 90 tablet 3  . aspirin 81 MG tablet Take 81 mg by mouth daily.    Marland Kitchen atorvastatin (LIPITOR) 40 MG tablet Take 40 mg by mouth daily.    . clopidogrel (PLAVIX) 75 MG tablet TAKE 1 TABLET DAILY 90 tablet 3  . DULoxetine (CYMBALTA) 60 MG capsule Take 120 mg by mouth daily.     Marland Kitchen glipiZIDE (GLUCOTROL XL) 5 MG 24 hr tablet Take 5 mg by mouth 2 (two) times daily.    Marland Kitchen lisinopril (PRINIVIL,ZESTRIL) 40 MG tablet Take 40 mg by mouth daily.    . metFORMIN (GLUCOPHAGE) 1000 MG tablet Take 1 tablet (1,000 mg total) by mouth 2 (two) times daily with a meal.    . metoprolol (LOPRESSOR) 50 MG tablet Take 50 mg by mouth 2 (two) times daily.    . pantoprazole (PROTONIX) 40 MG tablet Take 40 mg by mouth daily.    . potassium chloride (K-DUR,KLOR-CON) 10 MEQ  tablet Take 10 mEq by mouth daily.    . risperidone (RISPERDAL) 4 MG tablet Take 4 mg by mouth daily.    . sitaGLIPtin (JANUVIA) 100 MG tablet Take 100 mg by mouth daily.    . traZODone (DESYREL) 50 MG tablet Take 50 mg by mouth at bedtime.    . Vitamin D, Ergocalciferol, (DRISDOL) 50000 UNITS CAPS capsule Take 50,000 Units by mouth every 7 (seven) days.     No current facility-administered medications on file prior to visit.   No Known Allergies  Labs: HEMOGLOBIN A1C- No recent lab   Objective: General: Patient is awake, alert, and oriented x 3 and in no acute distress.  Integument: Skin is warm, dry and supple bilateral. Nails are tender, long, thickened and  dystrophic with subungual debris, consistent with onychomycosis, 1-5 bilateral. No signs of infection. No open lesions or preulcerative lesions present bilateral. Remaining integument unremarkable.  Vasculature:  Dorsalis Pedis pulse 2/4 bilateral. Posterior Tibial pulse  2/4 bilateral.  Capillary fill time <3 sec 1-5 bilateral. Positive hair growth to the level of the digits. Temperature gradient within normal limits. No varicosities present bilateral. No edema present bilateral.   Neurology: The patient has intact sensation measured with a 5.07/10g Semmes Weinstein Monofilament at all pedal sites bilateral .  Vibratory sensation diminished bilateral with tuning fork. No Babinski sign present bilateral.   Musculoskeletal: No gross pedal deformities noted bilateral. Muscular strength 5/5 in all lower extremity muscular groups bilateral without pain or limitation on range of motion . No tenderness with calf compression bilateral.  Assessment and Plan: Problem List Items Addressed This Visit    None    Visit Diagnoses    Dermatophytosis of nail    -  Primary    Pain of toe, unspecified laterality        Diabetes mellitus without complication (HCC)          -Examined patient. -Discussed and educated patient on diabetic foot  care, especially with  regards to the vascular, neurological and musculoskeletal systems.  -Stressed the importance of good glycemic control and the detriment of not  controlling glucose levels in relation to the foot. -Mechanically debrided all nails 1-5 bilateral using sterile nail nipper and filed with dremel without incident  -Answered all patient questions -Patient to return in 3 months for at risk foot care -Patient advised to call the office if any problems or questions arise in the meantime.  Asencion Islam, DPM

## 2015-12-09 ENCOUNTER — Encounter: Payer: Self-pay | Admitting: *Deleted

## 2015-12-09 ENCOUNTER — Ambulatory Visit: Payer: Federal, State, Local not specified - PPO | Admitting: Cardiovascular Disease

## 2016-02-06 ENCOUNTER — Encounter: Payer: Self-pay | Admitting: Cardiovascular Disease

## 2016-02-06 ENCOUNTER — Ambulatory Visit (INDEPENDENT_AMBULATORY_CARE_PROVIDER_SITE_OTHER): Payer: Medicare Other | Admitting: Cardiovascular Disease

## 2016-02-06 VITALS — BP 190/120 | HR 90 | Ht 68.0 in | Wt 193.5 lb

## 2016-02-06 DIAGNOSIS — I2510 Atherosclerotic heart disease of native coronary artery without angina pectoris: Secondary | ICD-10-CM

## 2016-02-06 DIAGNOSIS — I10 Essential (primary) hypertension: Secondary | ICD-10-CM

## 2016-02-06 NOTE — Progress Notes (Signed)
Cardiology Office Note   Date:  02/06/2016   ID:  Altus, Zaino 1955-12-10, MRN [id number]  PCP:  Sherrie Mustache, MD  Cardiologist:   Lorine Bears, MD   Chief Complaint  Patient presents with  . other    6 month follow up. Meds reviewed by the pt's med list. "doing well."       History of Present Illness: Larry Riggs is a 60 y.o. male who presents for a followup visit regarding  coronary artery disease. He has known history of type 2 diabetes, hypertension, hyperlipidemia and obesity. He has known history of mild mental retardation and currently lives in a group home. He makes his own decisions. In October of 2014, he underwent a nuclear stress test due to significant exertional dyspnea. The stress test was abnormal and thus he was referred for cardiac catheterization which was done by Dr. Welton Flakes. It showed significant three-vessel coronary artery disease. He was referred to [facility] for CABG. However, the patient had no good targets in the LAD. The RCA disease was also in the small branches. Thus, he was felt to be not a candidate for CABG. Medical therapy versus PCI was recommended.  He continued to have significant exertional dyspnea. Thus, I proceeded with cardiac catheterization in February 2015 which showed significant three-vessel coronary artery disease. The LAD was diffusely diseased and occluded distally. The left circumflex had 90% proximal stenosis but was small in size. There was significant disease in the posterolateral branches of the right coronary artery. I attempted PCI of the LAD but there was significant diffuse disease and the vessel was occluded distally. I performed balloon angioplasty without stent placement with significant residual disease. I placed a drug-eluting stent to the ostial first diagonal. He is doing well with no chest pain or dyspnea. During last visit, he was noted to be hypotensive with a blood pressure of 85/60. Thus, I decreased the dose of  amlodipine to 2.5 mg once daily. Today his blood pressure is 190/110. The exact etiology for this fluctuation in blood pressure is not entirely clear. He reports that he takes his medications regularly. He is asymptomatic and denies any headache or dizziness.     Past Medical History  Diagnosis Date  . Obesity   . Mildly mentally retarded   . Coronary atherosclerosis of native coronary vessel     a. 08/2013 Cath: signif 3V dzs w EF of 55%, eval @ [facility] for CABG but felt that there was no good target in the LAD;  b. 12/2013 Cath/PCI: LM nl, LAD 50m (PTCA), 95d (attempted PTCA), subtl distal, D1 95 (2.75x16 Promus DES), D2 90ost, D3 80-90ost, D4 60ost, LCX 90p, small, RCA 69m, PDA nl, RPL1 60, RPL2 90ost.  . Hyperlipidemia   . Hypertension   . Asthma   . Type II diabetes mellitus (HCC)   . GERD (gastroesophageal reflux disease)   . Dysphagia     Past Surgical History  Procedure Laterality Date  . Inguinal hernia repair Right 1990's  . Cardiac catheterization  08/2013    [facility]  . Coronary angioplasty with stent placement  12/27/2013    "1"  . Percutaneous coronary stent intervention (pci-s) N/A 12/27/2013    Procedure: PERCUTANEOUS CORONARY STENT INTERVENTION (PCI-S);  Surgeon: Iran Ouch, MD;  Location: [facility] CATH LAB;  Service: Cardiovascular;  Laterality: N/A;     Current Outpatient Prescriptions  Medication Sig Dispense Refill  . albuterol (PROVENTIL HFA;VENTOLIN HFA) 108 (90 BASE) MCG/ACT inhaler Inhale  1 puff into the lungs 3 (three) times daily as needed.     Marland Kitchen. amLODipine (NORVASC) 2.5 MG tablet Take 1 tablet (2.5 mg total) by mouth daily. 90 tablet 3  . aspirin 81 MG tablet Take 81 mg by mouth daily.    Marland Kitchen. atorvastatin (LIPITOR) 40 MG tablet Take 40 mg by mouth daily.    . clopidogrel (PLAVIX) 75 MG tablet TAKE 1 TABLET DAILY 90 tablet 3  . DULoxetine (CYMBALTA) 60 MG capsule Take 120 mg by mouth daily.     Marland Kitchen. glipiZIDE (GLUCOTROL XL) 5 MG 24 hr tablet Take 5 mg by mouth 2  (two) times daily.    Marland Kitchen. lisinopril (PRINIVIL,ZESTRIL) 40 MG tablet Take 40 mg by mouth daily.    . metFORMIN (GLUCOPHAGE) 1000 MG tablet Take 1 tablet (1,000 mg total) by mouth 2 (two) times daily with a meal.    . metoprolol (LOPRESSOR) 50 MG tablet Take 50 mg by mouth 2 (two) times daily.    . pantoprazole (PROTONIX) 40 MG tablet Take 40 mg by mouth daily.    . potassium chloride (K-DUR,KLOR-CON) 10 MEQ tablet Take 10 mEq by mouth daily.    . risperidone (RISPERDAL) 4 MG tablet Take 4 mg by mouth daily.    . sitaGLIPtin (JANUVIA) 100 MG tablet Take 100 mg by mouth daily.    . traZODone (DESYREL) 50 MG tablet Take 50 mg by mouth at bedtime.    . Vitamin D, Ergocalciferol, (DRISDOL) 50000 UNITS CAPS capsule Take 50,000 Units by mouth every 7 (seven) days.     No current facility-administered medications for this visit.    Allergies:   Review of patient's allergies indicates no known allergies.    Social History:  The patient  reports that he has never smoked. He has never used smokeless tobacco. He reports that he does not drink alcohol or use illicit drugs.   Family History:  The patient's family history includes Diabetes in his father; Hypertension in his father.    ROS:  Please see the history of present illness.   Otherwise, review of systems are positive for none.   All other systems are reviewed and negative.    PHYSICAL EXAM: VS:  Ht 5\' 8"  (1.727 m)  Wt 193 lb 8 oz (87.771 kg)  BMI 29.43 kg/m2 , BMI Body mass index is 29.43 kg/(m^2). GEN: Well nourished, well developed, in no acute distress HEENT: normal Neck: no JVD, carotid bruits, or masses Cardiac: RRR; no murmurs, rubs, or gallops,no edema  Respiratory:  clear to auscultation bilaterally, normal work of breathing GI: soft, nontender, nondistended, + BS MS: no deformity or atrophy Skin: warm and dry, no rash Neuro:  Strength and sensation are intact Psych: euthymic mood, full affect   EKG:  EKG is ordered  today. The ekg ordered today demonstrates normal sinus rhythm with left axis deviation with evidence of prior inferior and anterior infarcts.   Recent Labs: No results found for requested labs within last 365 days.    Lipid Panel No results found for: CHOL, TRIG, HDL, CHOLHDL, VLDL, LDLCALC, LDLDIRECT    Wt Readings from Last 3 Encounters:  02/06/16 193 lb 8 oz (87.771 kg)  03/28/15 227 lb (102.967 kg)  04/12/14 227 lb 12 oz (103.307 kg)         ASSESSMENT AND PLAN:  1.  Coronary artery disease involving native coronary arteries without angina: The patient denies anginal symptoms at the present time. Continue medical therapy and lifelong dual  antiplatelet therapy.  2. Hyperlipidemia: Continue treatment with atorvastatin with a target LDL of less than 70.  3. Essential hypertension: The exact etiology for his fluctuation in blood pressure is not entirely clear. He was very hypertensive during last visit. Thus, I elected not to make any changes today. I stressed the importance of taking his medications regularly.   4. Type 2 diabetes: Managed by his primary care physician.    Disposition:   FU with me in 6 months  Signed,  Lorine Bears, MD  02/06/2016 10:31 AM    [facility]

## 2016-02-06 NOTE — Patient Instructions (Signed)
Medication Instructions: Continue same medications.   Labwork: None.   Procedures/Testing: None.   Follow-Up: 6 months with Dr. Emalea Mix.   Any Additional Special Instructions Will Be Listed Below (If Applicable).     If you need a refill on your cardiac medications before your next appointment, please call your pharmacy.   

## 2016-03-03 ENCOUNTER — Ambulatory Visit: Payer: Medicare Other | Admitting: Sports Medicine

## 2016-03-05 ENCOUNTER — Other Ambulatory Visit: Payer: Self-pay | Admitting: Cardiovascular Disease

## 2016-03-09 ENCOUNTER — Inpatient Hospital Stay (HOSPITAL_COMMUNITY)
Admission: EM | Admit: 2016-03-09 | Discharge: 2016-03-13 | DRG: 064 | Disposition: A | Discharge status: Home / Self Care | Payer: Medicare Other | Attending: Internal Medicine | Admitting: Internal Medicine

## 2016-03-09 ENCOUNTER — Encounter (HOSPITAL_COMMUNITY): Payer: Self-pay

## 2016-03-09 ENCOUNTER — Emergency Department (HOSPITAL_COMMUNITY): Payer: Medicare Other

## 2016-03-09 DIAGNOSIS — E119 Type 2 diabetes mellitus without complications: Secondary | ICD-10-CM

## 2016-03-09 DIAGNOSIS — Y92192 Bathroom in other specified residential institution as the place of occurrence of the external cause: Secondary | ICD-10-CM | POA: Diagnosis not present

## 2016-03-09 DIAGNOSIS — E669 Obesity, unspecified: Secondary | ICD-10-CM | POA: Diagnosis present

## 2016-03-09 DIAGNOSIS — I160 Hypertensive urgency: Secondary | ICD-10-CM | POA: Diagnosis present

## 2016-03-09 DIAGNOSIS — J45909 Unspecified asthma, uncomplicated: Secondary | ICD-10-CM | POA: Diagnosis present

## 2016-03-09 DIAGNOSIS — R739 Hyperglycemia, unspecified: Secondary | ICD-10-CM | POA: Diagnosis not present

## 2016-03-09 DIAGNOSIS — I6319 Cerebral infarction due to embolism of other precerebral artery: Secondary | ICD-10-CM | POA: Diagnosis not present

## 2016-03-09 DIAGNOSIS — Z955 Presence of coronary angioplasty implant and graft: Secondary | ICD-10-CM

## 2016-03-09 DIAGNOSIS — E871 Hypo-osmolality and hyponatremia: Secondary | ICD-10-CM | POA: Diagnosis present

## 2016-03-09 DIAGNOSIS — N179 Acute kidney failure, unspecified: Secondary | ICD-10-CM | POA: Diagnosis present

## 2016-03-09 DIAGNOSIS — I6340 Cerebral infarction due to embolism of unspecified cerebral artery: Secondary | ICD-10-CM | POA: Diagnosis present

## 2016-03-09 DIAGNOSIS — I639 Cerebral infarction, unspecified: Secondary | ICD-10-CM

## 2016-03-09 DIAGNOSIS — E785 Hyperlipidemia, unspecified: Secondary | ICD-10-CM | POA: Diagnosis present

## 2016-03-09 DIAGNOSIS — R569 Unspecified convulsions: Secondary | ICD-10-CM | POA: Diagnosis not present

## 2016-03-09 DIAGNOSIS — E1310 Other specified diabetes mellitus with ketoacidosis without coma: Secondary | ICD-10-CM | POA: Diagnosis present

## 2016-03-09 DIAGNOSIS — R000 Tachycardia, unspecified: Secondary | ICD-10-CM | POA: Diagnosis present

## 2016-03-09 DIAGNOSIS — I10 Essential (primary) hypertension: Secondary | ICD-10-CM | POA: Diagnosis present

## 2016-03-09 DIAGNOSIS — F70 Mild intellectual disabilities: Secondary | ICD-10-CM | POA: Diagnosis present

## 2016-03-09 DIAGNOSIS — Z79899 Other long term (current) drug therapy: Secondary | ICD-10-CM | POA: Diagnosis not present

## 2016-03-09 DIAGNOSIS — I2510 Atherosclerotic heart disease of native coronary artery without angina pectoris: Secondary | ICD-10-CM | POA: Diagnosis present

## 2016-03-09 DIAGNOSIS — Z9111 Patient's noncompliance with dietary regimen: Secondary | ICD-10-CM | POA: Diagnosis not present

## 2016-03-09 DIAGNOSIS — Z6827 Body mass index (BMI) 27.0-27.9, adult: Secondary | ICD-10-CM | POA: Diagnosis not present

## 2016-03-09 DIAGNOSIS — Z7902 Long term (current) use of antithrombotics/antiplatelets: Secondary | ICD-10-CM | POA: Diagnosis not present

## 2016-03-09 DIAGNOSIS — R1310 Dysphagia, unspecified: Secondary | ICD-10-CM | POA: Diagnosis present

## 2016-03-09 DIAGNOSIS — E876 Hypokalemia: Secondary | ICD-10-CM | POA: Diagnosis present

## 2016-03-09 DIAGNOSIS — Z7984 Long term (current) use of oral hypoglycemic drugs: Secondary | ICD-10-CM | POA: Diagnosis not present

## 2016-03-09 DIAGNOSIS — K219 Gastro-esophageal reflux disease without esophagitis: Secondary | ICD-10-CM | POA: Diagnosis present

## 2016-03-09 DIAGNOSIS — Z7982 Long term (current) use of aspirin: Secondary | ICD-10-CM | POA: Diagnosis not present

## 2016-03-09 DIAGNOSIS — W1830XA Fall on same level, unspecified, initial encounter: Secondary | ICD-10-CM | POA: Diagnosis present

## 2016-03-09 DIAGNOSIS — R4182 Altered mental status, unspecified: Secondary | ICD-10-CM

## 2016-03-09 DIAGNOSIS — S00211A Abrasion of right eyelid and periocular area, initial encounter: Secondary | ICD-10-CM | POA: Diagnosis present

## 2016-03-09 DIAGNOSIS — R7989 Other specified abnormal findings of blood chemistry: Secondary | ICD-10-CM | POA: Diagnosis not present

## 2016-03-09 DIAGNOSIS — G4089 Other seizures: Secondary | ICD-10-CM | POA: Diagnosis present

## 2016-03-09 DIAGNOSIS — E118 Type 2 diabetes mellitus with unspecified complications: Secondary | ICD-10-CM | POA: Diagnosis not present

## 2016-03-09 DIAGNOSIS — R509 Fever, unspecified: Secondary | ICD-10-CM | POA: Diagnosis present

## 2016-03-09 DIAGNOSIS — R778 Other specified abnormalities of plasma proteins: Secondary | ICD-10-CM | POA: Diagnosis present

## 2016-03-09 DIAGNOSIS — R55 Syncope and collapse: Secondary | ICD-10-CM | POA: Diagnosis present

## 2016-03-09 LAB — COMPREHENSIVE METABOLIC PANEL
ALK PHOS: 106 U/L (ref 38–126)
ALT: 24 U/L (ref 17–63)
ANION GAP: 20 — AB (ref 5–15)
AST: 28 U/L (ref 15–41)
Albumin: 4.1 g/dL (ref 3.5–5.0)
BILIRUBIN TOTAL: 1.1 mg/dL (ref 0.3–1.2)
BUN: 8 mg/dL (ref 6–20)
CALCIUM: 9.8 mg/dL (ref 8.9–10.3)
CO2: 22 mmol/L (ref 22–32)
CREATININE: 1.11 mg/dL (ref 0.61–1.24)
Chloride: 90 mmol/L — ABNORMAL LOW (ref 101–111)
Glucose, Bld: 518 mg/dL — ABNORMAL HIGH (ref 65–99)
Potassium: 2.7 mmol/L — CL (ref 3.5–5.1)
SODIUM: 132 mmol/L — AB (ref 135–145)
TOTAL PROTEIN: 8.1 g/dL (ref 6.5–8.1)

## 2016-03-09 LAB — CBC WITH DIFFERENTIAL/PLATELET
BASOS ABS: 0 10*3/uL (ref 0.0–0.1)
BASOS PCT: 0 %
EOS ABS: 0.1 10*3/uL (ref 0.0–0.7)
Eosinophils Relative: 1 %
HCT: 49.2 % (ref 39.0–52.0)
HEMOGLOBIN: 18.2 g/dL — AB (ref 13.0–17.0)
Lymphocytes Relative: 10 %
Lymphs Abs: 1.1 10*3/uL (ref 0.7–4.0)
MCH: 29.1 pg (ref 26.0–34.0)
MCHC: 37 g/dL — AB (ref 30.0–36.0)
MCV: 78.6 fL (ref 78.0–100.0)
MONOS PCT: 4 %
Monocytes Absolute: 0.4 10*3/uL (ref 0.1–1.0)
NEUTROS ABS: 10.1 10*3/uL — AB (ref 1.7–7.7)
NEUTROS PCT: 86 %
Platelets: 217 10*3/uL (ref 150–400)
RBC: 6.26 MIL/uL — ABNORMAL HIGH (ref 4.22–5.81)
RDW: 12.9 % (ref 11.5–15.5)
WBC: 11.8 10*3/uL — ABNORMAL HIGH (ref 4.0–10.5)

## 2016-03-09 LAB — GLUCOSE, CAPILLARY
GLUCOSE-CAPILLARY: 509 mg/dL — AB (ref 65–99)
Glucose-Capillary: 507 mg/dL — ABNORMAL HIGH (ref 65–99)

## 2016-03-09 LAB — TROPONIN I: TROPONIN I: 0.17 ng/mL — AB (ref <0.031)

## 2016-03-09 LAB — MRSA PCR SCREENING: MRSA BY PCR: NEGATIVE

## 2016-03-09 LAB — I-STAT TROPONIN, ED: Troponin i, poc: 0.02 ng/mL (ref 0.00–0.08)

## 2016-03-09 LAB — PROTIME-INR
INR: 1.05 (ref 0.00–1.49)
Prothrombin Time: 13.9 seconds (ref 11.6–15.2)

## 2016-03-09 LAB — CBG MONITORING, ED: Glucose-Capillary: 492 mg/dL — ABNORMAL HIGH (ref 65–99)

## 2016-03-09 MED ORDER — VITAMIN D (ERGOCALCIFEROL) 1.25 MG (50000 UNIT) PO CAPS: 50000.0000 [IU] | ORAL_CAPSULE | ORAL | Status: DC

## 2016-03-09 MED ORDER — SENNOSIDES-DOCUSATE SODIUM 8.6-50 MG PO TABS: 1.0000 | ORAL_TABLET | Freq: Every evening | ORAL | Status: DC | PRN

## 2016-03-09 MED ORDER — ONDANSETRON HCL 4 MG PO TABS: 4.0000 mg | ORAL_TABLET | Freq: Four times a day (QID) | ORAL | Status: DC | PRN

## 2016-03-09 MED ORDER — POTASSIUM CHLORIDE 10 MEQ/100ML IV SOLN
10.0000 meq | Freq: Once | INTRAVENOUS | Status: AC
  Administered 2016-03-09: 10 meq via INTRAVENOUS
  Filled 2016-03-09: qty 100

## 2016-03-09 MED ORDER — LINAGLIPTIN 5 MG PO TABS
5.0000 mg | ORAL_TABLET | Freq: Every day | ORAL | Status: DC
  Administered 2016-03-09: 5 mg via ORAL
  Filled 2016-03-09 (×2): qty 1

## 2016-03-09 MED ORDER — SODIUM CHLORIDE 0.9 % IV SOLN
500.0000 mg | Freq: Two times a day (BID) | INTRAVENOUS | Status: DC
  Administered 2016-03-10 (×2): 500 mg via INTRAVENOUS
  Filled 2016-03-09 (×4): qty 5

## 2016-03-09 MED ORDER — PANTOPRAZOLE SODIUM 40 MG PO TBEC
40.0000 mg | DELAYED_RELEASE_TABLET | Freq: Every day | ORAL | Status: DC
  Administered 2016-03-09 – 2016-03-13 (×4): 40 mg via ORAL
  Filled 2016-03-09 (×4): qty 1

## 2016-03-09 MED ORDER — LISINOPRIL 20 MG PO TABS
40.0000 mg | ORAL_TABLET | Freq: Every day | ORAL | Status: DC
  Administered 2016-03-09 – 2016-03-13 (×4): 40 mg via ORAL
  Filled 2016-03-09 (×4): qty 2

## 2016-03-09 MED ORDER — INSULIN ASPART 100 UNIT/ML ~~LOC~~ SOLN: 0.0000 [IU] | Freq: Three times a day (TID) | SUBCUTANEOUS | Status: DC

## 2016-03-09 MED ORDER — ENOXAPARIN SODIUM 40 MG/0.4ML ~~LOC~~ SOLN
40.0000 mg | SUBCUTANEOUS | Status: DC
  Administered 2016-03-09 – 2016-03-12 (×4): 40 mg via SUBCUTANEOUS
  Filled 2016-03-09 (×5): qty 0.4

## 2016-03-09 MED ORDER — CLOPIDOGREL BISULFATE 75 MG PO TABS
75.0000 mg | ORAL_TABLET | Freq: Every day | ORAL | Status: DC
  Administered 2016-03-09 – 2016-03-13 (×4): 75 mg via ORAL
  Filled 2016-03-09 (×4): qty 1

## 2016-03-09 MED ORDER — RISPERIDONE 0.5 MG PO TABS
4.0000 mg | ORAL_TABLET | Freq: Every day | ORAL | Status: DC
  Administered 2016-03-09 – 2016-03-13 (×4): 4 mg via ORAL
  Filled 2016-03-09 (×4): qty 8

## 2016-03-09 MED ORDER — METOPROLOL TARTRATE 25 MG PO TABS: 50.0000 mg | ORAL_TABLET | Freq: Two times a day (BID) | ORAL | Status: DC

## 2016-03-09 MED ORDER — ONDANSETRON HCL 4 MG/2ML IJ SOLN: 4.0000 mg | Freq: Four times a day (QID) | INTRAMUSCULAR | Status: DC | PRN

## 2016-03-09 MED ORDER — HYDRALAZINE HCL 20 MG/ML IJ SOLN: 10.0000 mg | Freq: Four times a day (QID) | INTRAMUSCULAR | Status: DC | PRN

## 2016-03-09 MED ORDER — AMLODIPINE BESYLATE 2.5 MG PO TABS
2.5000 mg | ORAL_TABLET | Freq: Every day | ORAL | Status: DC
  Administered 2016-03-09 – 2016-03-13 (×4): 2.5 mg via ORAL
  Filled 2016-03-09 (×4): qty 1

## 2016-03-09 MED ORDER — LABETALOL HCL 5 MG/ML IV SOLN
5.0000 mg | INTRAVENOUS | Status: DC | PRN
Start: 2016-03-09 — End: 2016-03-10
  Filled 2016-03-09: qty 4

## 2016-03-09 MED ORDER — DULOXETINE HCL 60 MG PO CPEP
120.0000 mg | ORAL_CAPSULE | Freq: Every day | ORAL | Status: DC
  Administered 2016-03-09 – 2016-03-13 (×4): 120 mg via ORAL
  Filled 2016-03-09 (×3): qty 2
  Filled 2016-03-09: qty 4
  Filled 2016-03-09: qty 2

## 2016-03-09 MED ORDER — METFORMIN HCL 500 MG PO TABS
1000.0000 mg | ORAL_TABLET | Freq: Two times a day (BID) | ORAL | Status: DC
  Administered 2016-03-09: 1000 mg via ORAL
  Filled 2016-03-09: qty 2

## 2016-03-09 MED ORDER — POTASSIUM CHLORIDE 10 MEQ/100ML IV SOLN
10.0000 meq | INTRAVENOUS | Status: AC
  Administered 2016-03-09 (×3): 10 meq via INTRAVENOUS
  Filled 2016-03-09 (×4): qty 100

## 2016-03-09 MED ORDER — TRAZODONE HCL 50 MG PO TABS
50.0000 mg | ORAL_TABLET | Freq: Every day | ORAL | Status: DC
  Administered 2016-03-09 – 2016-03-12 (×4): 50 mg via ORAL
  Filled 2016-03-09 (×4): qty 1

## 2016-03-09 MED ORDER — METOPROLOL TARTRATE 1 MG/ML IV SOLN
5.0000 mg | Freq: Four times a day (QID) | INTRAVENOUS | Status: DC
  Administered 2016-03-09 – 2016-03-10 (×3): 5 mg via INTRAVENOUS
  Filled 2016-03-09 (×5): qty 5

## 2016-03-09 MED ORDER — POTASSIUM CHLORIDE CRYS ER 20 MEQ PO TBCR
40.0000 meq | EXTENDED_RELEASE_TABLET | Freq: Once | ORAL | Status: AC
  Administered 2016-03-09: 40 meq via ORAL
  Filled 2016-03-09: qty 2

## 2016-03-09 MED ORDER — INSULIN ASPART 100 UNIT/ML ~~LOC~~ SOLN: 0.0000 [IU] | Freq: Every day | SUBCUTANEOUS | Status: DC

## 2016-03-09 MED ORDER — GLIPIZIDE ER 5 MG PO TB24
5.0000 mg | ORAL_TABLET | Freq: Two times a day (BID) | ORAL | Status: DC
  Filled 2016-03-09: qty 1

## 2016-03-09 MED ORDER — SODIUM CHLORIDE 0.9% FLUSH
3.0000 mL | Freq: Two times a day (BID) | INTRAVENOUS | Status: DC
  Administered 2016-03-09 – 2016-03-13 (×8): 3 mL via INTRAVENOUS

## 2016-03-09 MED ORDER — LEVETIRACETAM 500 MG/5ML IV SOLN
1000.0000 mg | Freq: Once | INTRAVENOUS | Status: AC
  Administered 2016-03-09: 1000 mg via INTRAVENOUS
  Filled 2016-03-09: qty 10

## 2016-03-09 MED ORDER — ACETAMINOPHEN 650 MG RE SUPP
650.0000 mg | Freq: Four times a day (QID) | RECTAL | Status: DC | PRN
  Administered 2016-03-10: 650 mg via RECTAL
  Filled 2016-03-09: qty 1

## 2016-03-09 MED ORDER — ATORVASTATIN CALCIUM 40 MG PO TABS
40.0000 mg | ORAL_TABLET | Freq: Every day | ORAL | Status: DC
  Administered 2016-03-09 – 2016-03-13 (×4): 40 mg via ORAL
  Filled 2016-03-09 (×4): qty 1

## 2016-03-09 MED ORDER — LABETALOL HCL 5 MG/ML IV SOLN
10.0000 mg | Freq: Once | INTRAVENOUS | Status: AC
  Administered 2016-03-09: 10 mg via INTRAVENOUS
  Filled 2016-03-09: qty 4

## 2016-03-09 MED ORDER — SODIUM CHLORIDE 0.9 % IV BOLUS (SEPSIS)
1000.0000 mL | Freq: Once | INTRAVENOUS | Status: AC
  Administered 2016-03-09: 1000 mL via INTRAVENOUS

## 2016-03-09 MED ORDER — LABETALOL HCL 5 MG/ML IV SOLN
10.0000 mg | Freq: Once | INTRAVENOUS | Status: AC
  Administered 2016-03-09: 10 mg via INTRAVENOUS

## 2016-03-09 MED ORDER — ALBUTEROL SULFATE HFA 108 (90 BASE) MCG/ACT IN AERS: 1.0000 | INHALATION_SPRAY | Freq: Four times a day (QID) | RESPIRATORY_TRACT | Status: DC | PRN

## 2016-03-09 MED ORDER — GLIPIZIDE ER 5 MG PO TB24
5.0000 mg | ORAL_TABLET | Freq: Two times a day (BID) | ORAL | Status: DC
  Administered 2016-03-09: 5 mg via ORAL
  Filled 2016-03-09 (×2): qty 1

## 2016-03-09 MED ORDER — POTASSIUM CHLORIDE CRYS ER 10 MEQ PO TBCR
10.0000 meq | EXTENDED_RELEASE_TABLET | Freq: Every day | ORAL | Status: DC
  Administered 2016-03-09 – 2016-03-13 (×4): 10 meq via ORAL
  Filled 2016-03-09 (×4): qty 1

## 2016-03-09 MED ORDER — ALBUTEROL SULFATE (2.5 MG/3ML) 0.083% IN NEBU: 2.5000 mg | INHALATION_SOLUTION | Freq: Four times a day (QID) | RESPIRATORY_TRACT | Status: DC | PRN

## 2016-03-09 MED ORDER — ACETAMINOPHEN 325 MG PO TABS
650.0000 mg | ORAL_TABLET | Freq: Four times a day (QID) | ORAL | Status: DC | PRN
  Administered 2016-03-09: 650 mg via ORAL
  Filled 2016-03-09: qty 2

## 2016-03-09 NOTE — ED Notes (Signed)
Pts brother contacted

## 2016-03-09 NOTE — ED Notes (Signed)
Pt presents to ed with ems with complaints of seizures, the patient lives in a group home for mental handicap, he was in the bathroom throwing up and they heard a crash and they went to check on him he was having a seizure and had cut his head open, patient is confused on arrival to ed and is not forming his words appropriately, er md at bedside, ems does not know the patients baseline, patient is breathing without difficulty and is awake

## 2016-03-09 NOTE — Progress Notes (Signed)
Pt received from ED on stretcher to bed CHG bath done and MRSA swab placed on monitor

## 2016-03-09 NOTE — ED Notes (Signed)
Dr. Madilyn Hookees aware of patients elevated bp

## 2016-03-09 NOTE — ED Notes (Signed)
CT contacted for urgent need of CT.

## 2016-03-09 NOTE — ED Provider Notes (Signed)
CSN: [id number]     Arrival date & time 03/09/16  1335 History   First MD Initiated Contact with Patient 03/09/16 1335     Chief Complaint  Patient presents with  . Seizures     The history is provided by the EMS personnel and a relative. No language interpreter was used.   Larry Riggs is a 60 y.o. male who presents to the Emergency Department complaining of fall.  Level V caveat due to confusion.  Hx is provided by EMS.  Per EMS pt is a resident of a group home in [place].  The manager heard a yell and the patient fell in another room.  The patient was unresponsive and CPR was started by the group home manager.  When fire arrived CPR was discontinued and the patient was noted to be combative.  He had emesis x 1 for EMS but did calm down during transit to ED.  He was given zofran  IV prior to ED arrival.  No hx/o seizure.  Per brother the patient has been placed in the group home because he is unable to live on his own, he is confused at times.    Past Medical History  Diagnosis Date  . Obesity   . Mildly mentally retarded   . Coronary atherosclerosis of native coronary vessel     a. 08/2013 Cath: signif 3V dzs w EF of 55%, eval @ [facility] for CABG but felt that there was no good target in the LAD;  b. 12/2013 Cath/PCI: LM nl, LAD 15m (PTCA), 95d (attempted PTCA), subtl distal, D1 95 (2.75x16 Promus DES), D2 90ost, D3 80-90ost, D4 60ost, LCX 90p, small, RCA 35m, PDA nl, RPL1 60, RPL2 90ost.  . Hyperlipidemia   . Hypertension   . Asthma   . Type II diabetes mellitus (HCC)   . GERD (gastroesophageal reflux disease)   . Dysphagia    Past Surgical History  Procedure Laterality Date  . Inguinal hernia repair Right 1990's  . Cardiac catheterization  08/2013    [facility]  . Coronary angioplasty with stent placement  12/27/2013    "1"  . Percutaneous coronary stent intervention (pci-s) N/A 12/27/2013    Procedure: PERCUTANEOUS CORONARY STENT INTERVENTION (PCI-S);  Surgeon: Iran Ouch,  MD;  Location: [facility] CATH LAB;  Service: Cardiovascular;  Laterality: N/A;   Family History  Problem Relation Age of Onset  . Hypertension Father   . Diabetes Father    Social History  Substance Use Topics  . Smoking status: Never Smoker   . Smokeless tobacco: Never Used  . Alcohol Use: No    Review of Systems  Unable to perform ROS: Mental status change      Allergies  Review of patient's allergies indicates no known allergies.  Home Medications   Prior to Admission medications   Medication Sig Start Date End Date Taking? Authorizing Provider  amLODipine (NORVASC) 2.5 MG tablet Take 1 tablet (2.5 mg total) by mouth daily. 11/19/15  Yes Iran Ouch, MD  aspirin 81 MG tablet Take 81 mg by mouth daily.   Yes Historical Provider, MD  atorvastatin (LIPITOR) 40 MG tablet Take 40 mg by mouth daily.   Yes Historical Provider, MD  clopidogrel (PLAVIX) 75 MG tablet TAKE 1 TABLET DAILY 03/06/16  Yes Iran Ouch, MD  DULoxetine (CYMBALTA) 60 MG capsule Take 120 mg by mouth daily.    Yes Historical Provider, MD  glipiZIDE (GLUCOTROL XL) 5 MG 24 hr tablet Take 5 mg  by mouth 2 (two) times daily.   Yes Historical Provider, MD  hydrochlorothiazide (HYDRODIURIL) 25 MG tablet Take 25 mg by mouth daily.   Yes Historical Provider, MD  lisinopril (PRINIVIL,ZESTRIL) 40 MG tablet Take 40 mg by mouth daily.   Yes Historical Provider, MD  metFORMIN (GLUCOPHAGE) 1000 MG tablet Take 1 tablet (1,000 mg total) by mouth 2 (two) times daily with a meal. 12/28/13  Yes Ok Anis, NP  metoprolol (LOPRESSOR) 50 MG tablet Take 50 mg by mouth 2 (two) times daily.   Yes Historical Provider, MD  pantoprazole (PROTONIX) 40 MG tablet Take 40 mg by mouth daily.   Yes Historical Provider, MD  potassium chloride (K-DUR,KLOR-CON) 10 MEQ tablet Take 10 mEq by mouth daily.   Yes Historical Provider, MD  risperidone (RISPERDAL) 4 MG tablet Take 4 mg by mouth daily.   Yes Historical Provider, MD  sitaGLIPtin  (JANUVIA) 100 MG tablet Take 100 mg by mouth daily.   Yes Historical Provider, MD  traZODone (DESYREL) 50 MG tablet Take 50 mg by mouth at bedtime.   Yes Historical Provider, MD  Vitamin D, Ergocalciferol, (DRISDOL) 50000 UNITS CAPS capsule Take 50,000 Units by mouth every 7 (seven) days.   Yes Historical Provider, MD  albuterol (PROVENTIL HFA;VENTOLIN HFA) 108 (90 BASE) MCG/ACT inhaler Inhale 1 puff into the lungs 3 (three) times daily as needed.     Historical Provider, MD   BP 211/131 mmHg  Pulse 102  Temp(Src) 98 F (36.7 C) (Oral)  Resp 22  Ht 5\' 10"  (1.778 m)  Wt 200 lb (90.719 kg)  BMI 28.70 kg/m2  SpO2 94% Physical Exam  Constitutional: He appears well-developed and well-nourished.  HENT:  Head: Normocephalic.  Abrasion to right lateral eyelid  Eyes: EOM are normal. Pupils are equal, round, and reactive to light.  Cardiovascular: Regular rhythm.   No murmur heard. tachycardic  Pulmonary/Chest: Effort normal and breath sounds normal. No respiratory distress.  Abdominal: Soft. There is no tenderness. There is no rebound and no guarding.  Musculoskeletal: He exhibits no edema or tenderness.  Neurological: He is alert.  Confused, stuttering and dysarthric speech.  Moves all extremities, intermittently follows commands.  Skin: Skin is warm and dry.  Psychiatric: He has a normal mood and affect. His behavior is normal.  Nursing note and vitals reviewed.   ED Course  Procedures (including critical care time) LACERATION REPAIR Performed by: Tilden Fossa Authorized by: Tilden Fossa Consent: Verbal consent obtained. Risks and benefits: risks, benefits and alternatives were discussed Consent given by: patient Patient identity confirmed: provided demographic data Prepped and Draped in normal sterile fashion Wound explored  Laceration Location: right eyebrow  Laceration Length: 1cm  No Foreign Bodies seen or palpated  Anesthesia: none  Amount of cleaning:  standard  Skin closure: tissue adhesive  Patient tolerance: Patient tolerated the procedure well with no immediate complications.  Labs Review Labs Reviewed  COMPREHENSIVE METABOLIC PANEL - Abnormal; Notable for the following:    Sodium 132 (*)    Potassium 2.7 (*)    Chloride 90 (*)    Glucose, Bld 518 (*)    Anion gap 20 (*)    All other components within normal limits  CBC WITH DIFFERENTIAL/PLATELET - Abnormal; Notable for the following:    WBC 11.8 (*)    RBC 6.26 (*)    Hemoglobin 18.2 (*)    MCHC 37.0 (*)    Neutro Abs 10.1 (*)    All other components within normal limits  CBG MONITORING, ED - Abnormal; Notable for the following:    Glucose-Capillary 492 (*)    All other components within normal limits  PROTIME-INR  MAGNESIUM  I-STAT TROPOININ, ED    Imaging Review Ct Head Wo Contrast  03/09/2016  CLINICAL DATA:  Fall today, head injury EXAM: CT HEAD WITHOUT CONTRAST CT CERVICAL SPINE WITHOUT CONTRAST TECHNIQUE: Multidetector CT imaging of the head and cervical spine was performed following the standard protocol without intravenous contrast. Multiplanar CT image reconstructions of the cervical spine were also generated. COMPARISON:  None. FINDINGS: CT HEAD FINDINGS No skull fracture is noted. There is mucosal thickening with partial opacification bilateral maxillary sinus. The mastoid air cells are unremarkable. No intracranial hemorrhage, mass effect or midline shift. Mild cerebral atrophy. No acute cortical infarction. There is old appearing infarct in right posterior cerebellum measures about 1.8 cm. Atherosclerotic calcifications are noted left vertebral artery and basilar artery. Mild periventricular white matter decreased attenuation probable due to chronic small vessel ischemic changes. No mass lesion is noted on this unenhanced scan. CT CERVICAL SPINE FINDINGS Axial images of the cervical spine shows no acute fracture or subluxation. Computer processed images shows no  acute fracture or subluxation. Degenerative changes are noted C1-C2 articulation. Mild disc space flattening with posterior spurring at C3-C4 level. Mild disc space flattening with mild anterior and mild posterior spurring at C4-C5 level. Moderate disc space flattening with mild anterior and moderate posterior spurring at C6-C7 level. No prevertebral soft tissue swelling. Cervical airway is patent. There is no pneumothorax in visualized lung apices. IMPRESSION: 1. No acute intracranial abnormality. Mild cerebral atrophy. Mild periventricular white matter decreased attenuation probable due to chronic small vessel ischemic changes. Bilateral maxillary sinus mucosal thickening with partial opacification. 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 No definite acute cortical infarction. 2. No cervical spine acute fracture or subluxation. Multilevel degenerative changes as described above. Electronically Signed   By: Natasha Mead M.D.   On: 03/09/2016 14:58   Ct Cervical Spine Wo Contrast  03/09/2016  CLINICAL DATA:  Fall today, head injury EXAM: CT HEAD WITHOUT CONTRAST CT CERVICAL SPINE WITHOUT CONTRAST TECHNIQUE: Multidetector CT imaging of the head and cervical spine was performed following the standard protocol without intravenous contrast. Multiplanar CT image reconstructions of the cervical spine were also generated. COMPARISON:  None. FINDINGS: CT HEAD FINDINGS No skull fracture is noted. There is mucosal thickening with partial opacification bilateral maxillary sinus. The mastoid air cells are unremarkable. No intracranial hemorrhage, mass effect or midline shift. Mild cerebral atrophy. No acute cortical infarction. There is old appearing infarct in right posterior cerebellum measures about 1.8 cm. Atherosclerotic calcifications are noted left vertebral artery and basilar artery. Mild periventricular white matter decreased attenuation probable due to chronic small vessel ischemic changes. No mass  lesion is noted on this unenhanced scan. CT CERVICAL SPINE FINDINGS Axial images of the cervical spine shows no acute fracture or subluxation. Computer processed images shows no acute fracture or subluxation. Degenerative changes are noted C1-C2 articulation. Mild disc space flattening with posterior spurring at C3-C4 level. Mild disc space flattening with mild anterior and mild posterior spurring at C4-C5 level. Moderate disc space flattening with mild anterior and moderate posterior spurring at C6-C7 level. No prevertebral soft tissue swelling. Cervical airway is patent. There is no pneumothorax in visualized lung apices. IMPRESSION: 1. No acute intracranial abnormality. Mild cerebral atrophy. Mild periventricular white matter decreased attenuation probable due to chronic small vessel ischemic changes. Bilateral maxillary sinus mucosal  thickening with partial opacification. 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 No definite acute cortical infarction. 2. No cervical spine acute fracture or subluxation. Multilevel degenerative changes as described above. Electronically Signed   By: Natasha MeadLiviu  Pop M.D.   On: 03/09/2016 14:58   Dg Chest Port 1 View  03/09/2016  CLINICAL DATA:  Seizure, vomiting EXAM: PORTABLE CHEST 1 VIEW COMPARISON:  12/15/2013 FINDINGS: Cardiac silhouette upper normal. Vascular pattern normal. Lungs clear. No effusions. IMPRESSION: No active disease. Electronically Signed   By: Esperanza Heiraymond  Rubner M.D.   On: 03/09/2016 15:46   I have personally reviewed and evaluated these images and lab results as part of my medical decision-making.   EKG Interpretation   Date/Time:  Monday March 09 2016 13:41:10 EDT Ventricular Rate:  115 PR Interval:  109 QRS Duration: 112 QT Interval:  403 QTC Calculation: 557 R Axis:   139 Text Interpretation:  Sinus or ectopic atrial tachycardia Lateral infarct,  recent Probable anteroseptal infarct, recent Prolonged QT interval  Confirmed by  Lincoln Brighamees, Liz [phone]([id number]) on 03/09/2016 2:10:33 PM Also confirmed by  Lincoln Brighamees, Liz [phone]), editor Dan HumphreysWALKER, CCT, SANDRA [phone]([id number])  on 03/09/2016  2:31:25 PM      MDM   Final diagnoses:  Hypokalemia  Syncope, unspecified syncope type    Pt here for evaluation of injuries following a fall - question possible unwitnessed seizure given pt's postictal period that has been improving during his stay in the Emergency Department.  Pt hypertensive and tachycardic - improved after labetalol x 1.  Per brother pt is not always compliant with his medications.  Plan to admit for further evaluation of syncope vs seizure.    Contact information: [place]: [phone] Najjar;  Larry, owner of group home: [phone]    Tilden FossaElizabeth Kieran Arreguin, MD 03/09/16 [phone]

## 2016-03-09 NOTE — Progress Notes (Signed)
CRITICAL VALUE ALERT  Critical value received:  T101  Date of notification: 03/09/2016    Time of notification: 1848   Critical value read back:yes  Nurse who received alert:  Gerald Dexterata Jabre Heo, RN  MD notified (1st page):  [phone]  Time of first page:  1848  MD notified (2nd page):  Time of second page:  Responding MD:  Dr. Benjamine MolaVann  Time MD responded:  Order noted.

## 2016-03-09 NOTE — ED Notes (Signed)
Potassium 2.7 Dr. Madilyn Hookees aware.

## 2016-03-09 NOTE — ED Notes (Signed)
Pt remains hypertensive, er md is made aware and awaiting ct report

## 2016-03-09 NOTE — Consult Note (Signed)
Admission H&P    Chief Complaint: Episode of unresponsiveness with possible seizure activity.  HPI: Larry Riggs is an 60 y.o. male history of mild mental retardation, coronary artery disease, hyperlipidemia, hypertension, diabetes mellitus and dysphagia, brought to the ED after being found on the floor of the bathroom at the group home where he resides. She reportedly was vomiting and was consciousness and fell. He reportedly demonstrated seizure-like activity on being found. There was also equivocal lack of pulse, and CPR was initiated. Pulse was present on arriving in the ED and CPR was discontinued. Patient was confused and agitated in the ED. He has remained confused. Glucose was 518 and potassium was 2.7. WBC count was 11.8. CT scan of the head showed no acute intracranial abnormality. White matter low vessel ischemic changes were noted, as well as old right cerebellar infarction. Patient has no history of seizure disorder.  Past Medical History  Diagnosis Date  . Obesity   . Mildly mentally retarded   . Coronary atherosclerosis of native coronary vessel     a. 08/2013 Cath: signif 3V dzs w EF of 55%, eval @ [facility] for CABG but felt that there was no good target in the LAD;  b. 12/2013 Cath/PCI: LM nl, LAD 54m(PTCA), 95d (attempted PTCA), subtl distal, D1 95 (2.75x16 Promus DES), D2 90ost, D3 80-90ost, D4 60ost, LCX 90p, small, RCA 255mPDA nl, RPL1 60, RPL2 90ost.  . Hyperlipidemia   . Hypertension   . Asthma   . Type II diabetes mellitus ([place]  . GERD (gastroesophageal reflux disease)   . Dysphagia     Past Surgical History  Procedure Laterality Date  . Inguinal hernia repair Right 1990's  . Cardiac catheterization  08/2013    [facility]  . Coronary angioplasty with stent placement  12/27/2013    "1"  . Percutaneous coronary stent intervention (pci-s) N/A 12/27/2013    Procedure: PERCUTANEOUS CORONARY STENT INTERVENTION (PCI-S);  Surgeon: MuWellington HampshireMD;  [facility];   Service: Cardiovascular;  Laterality: N/A;    Family History  Problem Relation Age of Onset  . Hypertension Father   . Diabetes Father    Social History:  reports that he has never smoked. He has never used smokeless tobacco. He reports that he does not drink alcohol or use illicit drugs.  Allergies: No Known Allergies  Medications Prior to Admission  Medication Sig Dispense Refill  . amLODipine (NORVASC) 2.5 MG tablet Take 1 tablet (2.5 mg total) by mouth daily. 90 tablet 3  . aspirin 81 MG tablet Take 81 mg by mouth daily.    . Marland Kitchentorvastatin (LIPITOR) 40 MG tablet Take 40 mg by mouth daily.    . clopidogrel (PLAVIX) 75 MG tablet TAKE 1 TABLET DAILY 90 tablet 3  . DULoxetine (CYMBALTA) 60 MG capsule Take 120 mg by mouth daily.     . Marland KitchenlipiZIDE (GLUCOTROL XL) 5 MG 24 hr tablet Take 5 mg by mouth 2 (two) times daily.    . hydrochlorothiazide (HYDRODIURIL) 25 MG tablet Take 25 mg by mouth daily.    . Marland Kitchenisinopril (PRINIVIL,ZESTRIL) 40 MG tablet Take 40 mg by mouth daily.    . metFORMIN (GLUCOPHAGE) 1000 MG tablet Take 1 tablet (1,000 mg total) by mouth 2 (two) times daily with a meal.    . metoprolol (LOPRESSOR) 50 MG tablet Take 50 mg by mouth 2 (two) times daily.    . pantoprazole (PROTONIX) 40 MG tablet Take 40 mg by mouth daily.    .Marland Kitchen  potassium chloride (K-DUR,KLOR-CON) 10 MEQ tablet Take 10 mEq by mouth daily.    . risperidone (RISPERDAL) 4 MG tablet Take 4 mg by mouth daily.    . sitaGLIPtin (JANUVIA) 100 MG tablet Take 100 mg by mouth daily.    . traZODone (DESYREL) 50 MG tablet Take 50 mg by mouth at bedtime.    . Vitamin D, Ergocalciferol, (DRISDOL) 50000 UNITS CAPS capsule Take 50,000 Units by mouth every 7 (seven) days.    Marland Kitchen albuterol (PROVENTIL HFA;VENTOLIN HFA) 108 (90 BASE) MCG/ACT inhaler Inhale 1 puff into the lungs 3 (three) times daily as needed.       ROS: Unavailable due to patient's altered mental status.  Physical Examination: Blood pressure 149/84, pulse 98,  temperature 101 F (38.3 C), temperature source Oral, resp. rate 13, height '5\' 10"'  (1.778 m), weight 82.8 kg (182 lb 8.7 oz), SpO2 90 %.  HEENT-  Normocephalic, no lesions, without obvious abnormality.  Normal external eye and conjunctiva.  Normal TM's bilaterally.  Normal auditory canals and external ears. Normal external nose, mucus membranes and septum.  Normal pharynx. Neck supple with no masses, nodes, nodules or enlargement. No signs of meningeal irritation with neck flexion. Cardiovascular - regular rate and rhythm, S1, S2 normal, no murmur, click, rub or gallop Lungs - chest clear, no wheezing, rales, normal symmetric air entry Abdomen - soft, non-tender; bowel sounds normal; no masses,  no organomegaly Extremities - no edema and no skin discoloration  Neurologic Examination: Patient was febrile with oral temperature of 101 Fahrenheit. Patient was confused and somewhat agitated, and was somewhat persistent in trying to get out of bed. He was verbally responsive but with only 1-2 syllable repeated responses. He did not follow any simple commands. Pupils were equal and reacted normally to light. Extraocular movements were intact and full as well as conjugate on right left lateral gaze. Face was symmetrical with no focal weakness. Speech was somewhat slurred but otherwise unremarkable. Strength was normal and symmetrical throughout. Muscle tone was flaccid at rest. Deep tendon reflexes were trace to 1+ and symmetrical. Plantar responses were mute bilaterally.  Results for orders placed or performed during the hospital encounter of 03/09/16 (from the past 48 hour(s))  Comprehensive metabolic panel     Status: Abnormal   Collection Time: 03/09/16  1:56 PM  Result Value Ref Range   Sodium 132 (L) 135 - 145 mmol/L   Potassium 2.7 (LL) 3.5 - 5.1 mmol/L    Comment: CRITICAL RESULT CALLED TO, READ BACK BY AND VERIFIED WITH: E.BOYCE,RN 03/09/16 1432 BY BSLADE    Chloride 90 (L) 101 - 111  mmol/L   CO2 22 22 - 32 mmol/L   Glucose, Bld 518 (H) 65 - 99 mg/dL   BUN 8 6 - 20 mg/dL   Creatinine, Ser 1.11 0.61 - 1.24 mg/dL   Calcium 9.8 8.9 - 10.3 mg/dL   Total Protein 8.1 6.5 - 8.1 g/dL   Albumin 4.1 3.5 - 5.0 g/dL   AST 28 15 - 41 U/L   ALT 24 17 - 63 U/L   Alkaline Phosphatase 106 38 - 126 U/L   Total Bilirubin 1.1 0.3 - 1.2 mg/dL   GFR calc non Af Amer >60 >60 mL/min   GFR calc Af Amer >60 >60 mL/min    Comment: (NOTE) The eGFR has been calculated using the CKD EPI equation. This calculation has not been validated in all clinical situations. eGFR's persistently <60 mL/min signify possible Chronic Kidney Disease.  Anion gap 20 (H) 5 - 15  CBC with Differential     Status: Abnormal   Collection Time: 03/09/16  1:56 PM  Result Value Ref Range   WBC 11.8 (H) 4.0 - 10.5 K/uL   RBC 6.26 (H) 4.22 - 5.81 MIL/uL   Hemoglobin 18.2 (H) 13.0 - 17.0 g/dL   HCT 49.2 39.0 - 52.0 %   MCV 78.6 78.0 - 100.0 fL   MCH 29.1 26.0 - 34.0 pg   MCHC 37.0 (H) 30.0 - 36.0 g/dL   RDW 12.9 11.5 - 15.5 %   Platelets 217 150 - 400 K/uL   Neutrophils Relative % 86 %   Neutro Abs 10.1 (H) 1.7 - 7.7 K/uL   Lymphocytes Relative 10 %   Lymphs Abs 1.1 0.7 - 4.0 K/uL   Monocytes Relative 4 %   Monocytes Absolute 0.4 0.1 - 1.0 K/uL   Eosinophils Relative 1 %   Eosinophils Absolute 0.1 0.0 - 0.7 K/uL   Basophils Relative 0 %   Basophils Absolute 0.0 0.0 - 0.1 K/uL  CBG monitoring, ED     Status: Abnormal   Collection Time: 03/09/16  2:05 PM  Result Value Ref Range   Glucose-Capillary 492 (H) 65 - 99 mg/dL  I-stat troponin, ED     Status: None   Collection Time: 03/09/16  2:17 PM  Result Value Ref Range   Troponin i, poc 0.02 0.00 - 0.08 ng/mL   Comment 3            Comment: Due to the release kinetics of cTnI, a negative result within the first hours of the onset of symptoms does not rule out myocardial infarction with certainty. If myocardial infarction is still suspected, repeat the  test at appropriate intervals.   Protime-INR     Status: None   Collection Time: 03/09/16  2:18 PM  Result Value Ref Range   Prothrombin Time 13.9 11.6 - 15.2 seconds   INR 1.05 0.00 - 1.49  Troponin I     Status: Abnormal   Collection Time: 03/09/16  6:22 PM  Result Value Ref Range   Troponin I 0.17 (H) <0.031 ng/mL    Comment:        PERSISTENTLY INCREASED TROPONIN VALUES IN THE RANGE OF 0.04-0.49 ng/mL CAN BE SEEN IN:       -UNSTABLE ANGINA       -CONGESTIVE HEART FAILURE       -MYOCARDITIS       -CHEST TRAUMA       -ARRYHTHMIAS       -LATE PRESENTING MYOCARDIAL INFARCTION       -COPD   CLINICAL FOLLOW-UP RECOMMENDED.   Glucose, capillary     Status: Abnormal   Collection Time: 03/09/16  6:24 PM  Result Value Ref Range   Glucose-Capillary 509 (H) 65 - 99 mg/dL  Culture, blood (Routine X 2) w Reflex to ID Panel     Status: None (Preliminary result)   Collection Time: 03/09/16  6:36 PM  Result Value Ref Range   Specimen Description BLOOD RIGHT HAND    Special Requests BOTTLES DRAWN AEROBIC AND ANAEROBIC 10CC EA    Culture PENDING    Report Status PENDING   Culture, blood (Routine X 2) w Reflex to ID Panel     Status: None (Preliminary result)   Collection Time: 03/09/16  6:37 PM  Result Value Ref Range   Specimen Description BLOOD LEFT ANTECUBITAL    Special Requests BOTTLES DRAWN AEROBIC  ONLY 5CC EA    Culture PENDING    Report Status PENDING    Ct Head Wo Contrast  03/09/2016  CLINICAL DATA:  Fall today, head injury EXAM: CT HEAD WITHOUT CONTRAST CT CERVICAL SPINE WITHOUT CONTRAST TECHNIQUE: Multidetector CT imaging of the head and cervical spine was performed following the standard protocol without intravenous contrast. Multiplanar CT image reconstructions of the cervical spine were also generated. COMPARISON:  None. FINDINGS: CT HEAD FINDINGS No skull fracture is noted. There is mucosal thickening with partial opacification bilateral maxillary sinus. The mastoid air  cells are unremarkable. No intracranial hemorrhage, mass effect or midline shift. Mild cerebral atrophy. No acute cortical infarction. There is old appearing infarct in right posterior cerebellum measures about 1.8 cm. Atherosclerotic calcifications are noted left vertebral artery and basilar artery. Mild periventricular white matter decreased attenuation probable due to chronic small vessel ischemic changes. No mass lesion is noted on this unenhanced scan. CT CERVICAL SPINE FINDINGS Axial images of the cervical spine shows no acute fracture or subluxation. Computer processed images shows no acute fracture or subluxation. Degenerative changes are noted C1-C2 articulation. Mild disc space flattening with posterior spurring at C3-C4 level. Mild disc space flattening with mild anterior and mild posterior spurring at C4-C5 level. Moderate disc space flattening with mild anterior and moderate posterior spurring at C6-C7 level. No prevertebral soft tissue swelling. Cervical airway is patent. There is no pneumothorax in visualized lung apices. IMPRESSION: 1. No acute intracranial abnormality. Mild cerebral atrophy. Mild periventricular white matter decreased attenuation probable due to chronic small vessel ischemic changes. Bilateral maxillary sinus mucosal thickening with partial opacification. 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 No definite acute cortical infarction. 2. No cervical spine acute fracture or subluxation. Multilevel degenerative changes as described above. Electronically Signed   By: Lahoma Crocker M.D.   On: 03/09/2016 14:58   Ct Cervical Spine Wo Contrast  03/09/2016  CLINICAL DATA:  Fall today, head injury EXAM: CT HEAD WITHOUT CONTRAST CT CERVICAL SPINE WITHOUT CONTRAST TECHNIQUE: Multidetector CT imaging of the head and cervical spine was performed following the standard protocol without intravenous contrast. Multiplanar CT image reconstructions of the cervical spine were also  generated. COMPARISON:  None. FINDINGS: CT HEAD FINDINGS No skull fracture is noted. There is mucosal thickening with partial opacification bilateral maxillary sinus. The mastoid air cells are unremarkable. No intracranial hemorrhage, mass effect or midline shift. Mild cerebral atrophy. No acute cortical infarction. There is old appearing infarct in right posterior cerebellum measures about 1.8 cm. Atherosclerotic calcifications are noted left vertebral artery and basilar artery. Mild periventricular white matter decreased attenuation probable due to chronic small vessel ischemic changes. No mass lesion is noted on this unenhanced scan. CT CERVICAL SPINE FINDINGS Axial images of the cervical spine shows no acute fracture or subluxation. Computer processed images shows no acute fracture or subluxation. Degenerative changes are noted C1-C2 articulation. Mild disc space flattening with posterior spurring at C3-C4 level. Mild disc space flattening with mild anterior and mild posterior spurring at C4-C5 level. Moderate disc space flattening with mild anterior and moderate posterior spurring at C6-C7 level. No prevertebral soft tissue swelling. Cervical airway is patent. There is no pneumothorax in visualized lung apices. IMPRESSION: 1. No acute intracranial abnormality. Mild cerebral atrophy. Mild periventricular white matter decreased attenuation probable due to chronic small vessel ischemic changes. Bilateral maxillary sinus mucosal thickening with partial opacification. 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 No definite acute cortical infarction. 2. No  cervical spine acute fracture or subluxation. Multilevel degenerative changes as described above. Electronically Signed   By: Lahoma Crocker M.D.   On: 03/09/2016 14:58   Dg Chest Port 1 View  03/09/2016  CLINICAL DATA:  Seizure, vomiting EXAM: PORTABLE CHEST 1 VIEW COMPARISON:  12/15/2013 FINDINGS: Cardiac silhouette upper normal. Vascular pattern  normal. Lungs clear. No effusions. IMPRESSION: No active disease. Electronically Signed   By: Skipper Cliche M.D.   On: 03/09/2016 15:46    Assessment/Plan 60 year old man mild mental retardation as well as multiple medical disorders as listed above, presenting with confusion and agitation following an episode of loss of consciousness with possible seizure activity. Syncope is less likely with prolonged confusion and agitation after regaining consciousness. Ongoing partial seizure activity cannot be ruled out at this point. Patient was also febrile. Source of fever is unclear. He has no clinical signs of acute meningitis.  Recommendations: 1. Cultures and antibiotic management per primary admitting team 2. Keppra, loading dose of 1000 mg IV followed by maintenance dose of 500 mg every 12 hours 3. EEG in the a.m. 4. MRI of the brain without contrast  We will continue to follow this patient with you.  C.R. Nicole Kindred, [facility] [phone]  03/09/2016, 8:32 PM

## 2016-03-09 NOTE — H&P (Signed)
History and Physical    Larry Riggs:[id number] DOB: 30-Jul-1956 DOA: 03/09/2016  Referring MD/NP/PA: Madilyn Hook PCP: Sherrie Mustache, MD Outpatient Specialists:  Patient coming from: ER  Chief Complaint: found down in bathroom  HPI: Larry Riggs is a 60 y.o. male with medical history significant of obesity, CAD (3 vessel disease but no CABG), and mental retardation living in a group home.  Story is obtained from ER physician and EMS report as patient unable to give history and I was unable to reach group home.   Group Home worker reports patient was in the bathroom "throwing up" and they heard a "yell" and then a crash and then found him on the floor.  When they arrived, he appeared to be having a seizure.  He had cuts to the right side of his face.  He was also confused as he "came to".  He slowly in the ER became less confused.  Dr. Madilyn Hook also reported that Group home workers were unable to find a pulse so they did CPR.  When EMS arrived and they stopped CPR, he had a pulse and was combative.    Per Dr. Madilyn Hook, patient's baseline is confused at times.     ED Course: IN the ER, labs were remarkable for a mild WBC count, an elevated glucose, hyponatremia and hypokalemia.  His BP was markedly elevated as well.  Patient was unwilling to take PO home meds so SDU bed requested as BP gtt may be needed.   Review of Systems: unable to do due to AMS   Past Medical History  Diagnosis Date  . Obesity   . Mildly mentally retarded   . Coronary atherosclerosis of native coronary vessel     a. 08/2013 Cath: signif 3V dzs w EF of 55%, eval @ [facility] for CABG but felt that there was no good target in the LAD;  b. 12/2013 Cath/PCI: LM nl, LAD 42m (PTCA), 95d (attempted PTCA), subtl distal, D1 95 (2.75x16 Promus DES), D2 90ost, D3 80-90ost, D4 60ost, LCX 90p, small, RCA 34m, PDA nl, RPL1 60, RPL2 90ost.  . Hyperlipidemia   . Hypertension   . Asthma   . Type II diabetes mellitus (HCC)   . GERD  (gastroesophageal reflux disease)   . Dysphagia     Past Surgical History  Procedure Laterality Date  . Inguinal hernia repair Right 1990's  . Cardiac catheterization  08/2013    [facility]  . Coronary angioplasty with stent placement  12/27/2013    "1"  . Percutaneous coronary stent intervention (pci-s) N/A 12/27/2013    Procedure: PERCUTANEOUS CORONARY STENT INTERVENTION (PCI-S);  Surgeon: Iran Ouch, MD;  Location: [facility] C[facility] LAB;  Service: Cardiovascular;  Laterality: N/A;     reports that he has never smoked. He has never used smokeless tobacco. He reports that he does not drink alcohol or use illicit drugs.  No Known Allergies  Family History  Problem Relation Age of Onset  . Hypertension Father   . Diabetes Father      Prior to Admission medications   Medication Sig Start Date End Date Taking? Authorizing Provider  amLODipine (NORVASC) 2.5 MG tablet Take 1 tablet (2.5 mg total) by mouth daily. 11/19/15  Yes Iran Ouch, MD  aspirin 81 MG tablet Take 81 mg by mouth daily.   Yes Historical Provider, MD  atorvastatin (LIPITOR) 40 MG tablet Take 40 mg by mouth daily.   Yes Historical Provider, MD  clopidogrel (PLAVIX) 75 MG tablet TAKE  1 TABLET DAILY 03/06/16  Yes Iran Ouch, MD  DULoxetine (CYMBALTA) 60 MG capsule Take 120 mg by mouth daily.    Yes Historical Provider, MD  glipiZIDE (GLUCOTROL XL) 5 MG 24 hr tablet Take 5 mg by mouth 2 (two) times daily.   Yes Historical Provider, MD  hydrochlorothiazide (HYDRODIURIL) 25 MG tablet Take 25 mg by mouth daily.   Yes Historical Provider, MD  lisinopril (PRINIVIL,ZESTRIL) 40 MG tablet Take 40 mg by mouth daily.   Yes Historical Provider, MD  metFORMIN (GLUCOPHAGE) 1000 MG tablet Take 1 tablet (1,000 mg total) by mouth 2 (two) times daily with a meal. 12/28/13  Yes Ok Anis, NP  metoprolol (LOPRESSOR) 50 MG tablet Take 50 mg by mouth 2 (two) times daily.   Yes Historical Provider, MD  pantoprazole (PROTONIX) 40 MG  tablet Take 40 mg by mouth daily.   Yes Historical Provider, MD  potassium chloride (K-DUR,KLOR-CON) 10 MEQ tablet Take 10 mEq by mouth daily.   Yes Historical Provider, MD  risperidone (RISPERDAL) 4 MG tablet Take 4 mg by mouth daily.   Yes Historical Provider, MD  sitaGLIPtin (JANUVIA) 100 MG tablet Take 100 mg by mouth daily.   Yes Historical Provider, MD  traZODone (DESYREL) 50 MG tablet Take 50 mg by mouth at bedtime.   Yes Historical Provider, MD  Vitamin D, Ergocalciferol, (DRISDOL) 50000 UNITS CAPS capsule Take 50,000 Units by mouth every 7 (seven) days.   Yes Historical Provider, MD  albuterol (PROVENTIL HFA;VENTOLIN HFA) 108 (90 BASE) MCG/ACT inhaler Inhale 1 puff into the lungs 3 (three) times daily as needed.     Historical Provider, MD    Physical Exam: Filed Vitals:   03/09/16 1500 03/09/16 1515 03/09/16 1545 03/09/16 1600  BP: 223/151 223/137 198/137 182/127  Pulse: 105 110 112 98  Temp:      TempSrc:      Resp: Height:      Weight:      SpO2: 92% 94% 95% 92%      Constitutional: NAD, calm, comfortable Filed Vitals:   03/09/16 1500 03/09/16 1515 03/09/16 1545 03/09/16 1600  BP: 223/151 223/137 198/137 182/127  Pulse: 105 110 112 98  Temp:      TempSrc:      Resp: Height:      Weight:      SpO2: 92% 94% 95% 92%   Eyes: abrasion around right eye ENMT: Mucous membranes are moist. Posterior pharynx clear of any exudate or lesions.Poor dentition.  Neck: normal, supple, no masses, no thyromegaly Respiratory: clear to auscultation bilaterally, no wheezing, no crackles. Normal respiratory effort. No accessory muscle use.  Cardiovascular: tachy  Abdomen: no tenderness, no masses palpated. No hepatosplenomegaly. Bowel sounds positive.  Musculoskeletal: no clubbing / cyanosis. No joint deformity upper and lower extremities. Good ROM, no contractures. Normal muscle tone.  Skin: excuritations on b/l LE Neurologic: pleasantly confused, speech  dysarthric, intermittently following commands     Labs on Admission: I have personally reviewed following labs and imaging studies  CBC:  Recent Labs Lab 03/09/16 1356  WBC 11.8*  NEUTROABS 10.1*  HGB 18.2*  HCT 49.2  MCV 78.6  PLT 217   Basic Metabolic Panel:  Recent Labs Lab 03/09/16 1356  NA 132*  K 2.7*  CL 90*  CO2 22  GLUCOSE 518*  BUN 8  CREATININE 1.11  CALCIUM 9.8   GFR: Estimated Creatinine Clearance: 80.2 mL/min (by C-G  formula based on Cr of 1.11). Liver Function Tests:  Recent Labs Lab 03/09/16 1356  AST 28  ALT 24  ALKPHOS 106  BILITOT 1.1  PROT 8.1  ALBUMIN 4.1   No results for input(s): LIPASE, AMYLASE in the last 168 hours. No results for input(s): AMMONIA in the last 168 hours. Coagulation Profile:  Recent Labs Lab 03/09/16 1418  INR 1.05   Cardiac Enzymes: No results for input(s): CKTOTAL, CKMB, CKMBINDEX, TROPONINI in the last 168 hours. BNP (last 3 results) No results for input(s): PROBNP in the last 8760 hours. HbA1C: No results for input(s): HGBA1C in the last 72 hours. CBG:  Recent Labs Lab 03/09/16 1405  GLUCAP 492*   Lipid Profile: No results for input(s): CHOL, HDL, LDLCALC, TRIG, CHOLHDL, LDLDIRECT in the last 72 hours. Thyroid Function Tests: No results for input(s): TSH, T4TOTAL, FREET4, T3FREE, THYROIDAB in the last 72 hours. Anemia Panel: No results for input(s): VITAMINB12, FOLATE, FERRITIN, TIBC, IRON, RETICCTPCT in the last 72 hours. Urine analysis: No results found for: COLORURINE, APPEARANCEUR, LABSPEC, PHURINE, GLUCOSEU, HGBUR, BILIRUBINUR, KETONESUR, PROTEINUR, UROBILINOGEN, NITRITE, LEUKOCYTESUR Sepsis Labs: Invalid input(s): PROCALCITONIN, LACTICIDVEN No results found for this or any previous visit (from the past 240 hour(s)).   Radiological Exams on Admission: Ct Head Wo Contrast  03/09/2016  CLINICAL DATA:  Fall today, head injury EXAM: CT HEAD WITHOUT CONTRAST CT CERVICAL SPINE WITHOUT  CONTRAST TECHNIQUE: Multidetector CT imaging of the head and cervical spine was performed following the standard protocol without intravenous contrast. Multiplanar CT image reconstructions of the cervical spine were also generated. COMPARISON:  None. FINDINGS: CT HEAD FINDINGS No skull fracture is noted. There is mucosal thickening with partial opacification bilateral maxillary sinus. The mastoid air cells are unremarkable. No intracranial hemorrhage, mass effect or midline shift. Mild cerebral atrophy. No acute cortical infarction. There is old appearing infarct in right posterior cerebellum measures about 1.8 cm. Atherosclerotic calcifications are noted left vertebral artery and basilar artery. Mild periventricular white matter decreased attenuation probable due to chronic small vessel ischemic changes. No mass lesion is noted on this unenhanced scan. CT CERVICAL SPINE FINDINGS Axial images of the cervical spine shows no acute fracture or subluxation. Computer processed images shows no acute fracture or subluxation. Degenerative changes are noted C1-C2 articulation. Mild disc space flattening with posterior spurring at C3-C4 level. Mild disc space flattening with mild anterior and mild posterior spurring at C4-C5 level. Moderate disc space flattening with mild anterior and moderate posterior spurring at C6-C7 level. No prevertebral soft tissue swelling. Cervical airway is patent. There is no pneumothorax in visualized lung apices. IMPRESSION: 1. No acute intracranial abnormality. Mild cerebral atrophy. Mild periventricular white matter decreased attenuation probable due to chronic small vessel ischemic changes. Bilateral maxillary sinus mucosal thickening with partial opacification. 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 No definite acute cortical infarction. 2. No cervical spine acute fracture or subluxation. Multilevel degenerative changes as described above. Electronically Signed   By: Natasha MeadLiviu   Pop M.D.   On: 03/09/2016 14:58   Ct Cervical Spine Wo Contrast  03/09/2016  CLINICAL DATA:  Fall today, head injury EXAM: CT HEAD WITHOUT CONTRAST CT CERVICAL SPINE WITHOUT CONTRAST TECHNIQUE: Multidetector CT imaging of the head and cervical spine was performed following the standard protocol without intravenous contrast. Multiplanar CT image reconstructions of the cervical spine were also generated. COMPARISON:  None. FINDINGS: CT HEAD FINDINGS No skull fracture is noted. There is mucosal thickening with partial opacification bilateral maxillary sinus. The  mastoid air cells are unremarkable. No intracranial hemorrhage, mass effect or midline shift. Mild cerebral atrophy. No acute cortical infarction. There is old appearing infarct in right posterior cerebellum measures about 1.8 cm. Atherosclerotic calcifications are noted left vertebral artery and basilar artery. Mild periventricular white matter decreased attenuation probable due to chronic small vessel ischemic changes. No mass lesion is noted on this unenhanced scan. CT CERVICAL SPINE FINDINGS Axial images of the cervical spine shows no acute fracture or subluxation. Computer processed images shows no acute fracture or subluxation. Degenerative changes are noted C1-C2 articulation. Mild disc space flattening with posterior spurring at C3-C4 level. Mild disc space flattening with mild anterior and mild posterior spurring at C4-C5 level. Moderate disc space flattening with mild anterior and moderate posterior spurring at C6-C7 level. No prevertebral soft tissue swelling. Cervical airway is patent. There is no pneumothorax in visualized lung apices. IMPRESSION: 1. No acute intracranial abnormality. Mild cerebral atrophy. Mild periventricular white matter decreased attenuation probable due to chronic small vessel ischemic changes. Bilateral maxillary sinus mucosal thickening with partial opacification. 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 No definite acute cortical infarction. 2. No cervical spine acute fracture or subluxation. Multilevel degenerative changes as described above. Electronically Signed   By: Natasha Mead M.D.   On: 03/09/2016 14:58   Dg Chest Port 1 View  03/09/2016  CLINICAL DATA:  Seizure, vomiting EXAM: PORTABLE CHEST 1 VIEW COMPARISON:  12/15/2013 FINDINGS: Cardiac silhouette upper normal. Vascular pattern normal. Lungs clear. No effusions. IMPRESSION: No active disease. Electronically Signed   By: Esperanza Heir M.D.   On: 03/09/2016 15:46    EKG: Independently reviewed. Sinus tachy  Assessment/Plan Active Problems:   Hypertension   Type II diabetes mellitus (HCC)   Syncope and collapse   Seizures (HCC)   Hypokalemia   Hyperglycemia   Seizures vs vaso vagal syncope (reportedly vomiting) vs cardiac arrest -cycle CE -echo -tele -EEG -seizure precautions PT/OT eval -not hypoxic so doubt PE  HTN- urgency -? Accuracy of reading as arms are stiffened and bent -give home meds now in ER (patient refused so scheduled BP meds IV for now) -PRN IV labetalol  Hypokalemia -check Mg -repleat IV   DVT prophylaxis: lovenox Code Status:full Family Communication: unable to reach family or group home Disposition Plan:  Consults called:  Admission status: inpt   Jireh Vinas U Ramon Brant DO [facility] Pager [phone]  If 7PM-7AM, please contact night-coverage [email or web address] Password [facility]  03/09/2016, 4:38 PM

## 2016-03-09 NOTE — ED Notes (Signed)
Nurse on 2C made aware that the patient is still hypertensive but is now trending down, and about the patient being a high fall risk

## 2016-03-10 ENCOUNTER — Inpatient Hospital Stay (HOSPITAL_COMMUNITY): Payer: Federal, State, Local not specified - PPO

## 2016-03-10 ENCOUNTER — Inpatient Hospital Stay (HOSPITAL_COMMUNITY): Payer: Medicare Other

## 2016-03-10 DIAGNOSIS — R55 Syncope and collapse: Secondary | ICD-10-CM

## 2016-03-10 LAB — GLUCOSE, CAPILLARY
GLUCOSE-CAPILLARY: 507 mg/dL — AB (ref 65–99)
GLUCOSE-CAPILLARY: 441 mg/dL — AB (ref 65–99)
GLUCOSE-CAPILLARY: 371 mg/dL — AB (ref 65–99)
GLUCOSE-CAPILLARY: 271 mg/dL — AB (ref 65–99)
GLUCOSE-CAPILLARY: 211 mg/dL — AB (ref 65–99)
GLUCOSE-CAPILLARY: 152 mg/dL — AB (ref 65–99)
GLUCOSE-CAPILLARY: 169 mg/dL — AB (ref 65–99)
GLUCOSE-CAPILLARY: 143 mg/dL — AB (ref 65–99)
Glucose-Capillary: 331 mg/dL — ABNORMAL HIGH (ref 65–99)
Glucose-Capillary: 303 mg/dL — ABNORMAL HIGH (ref 65–99)
Glucose-Capillary: 156 mg/dL — ABNORMAL HIGH (ref 65–99)
Glucose-Capillary: 153 mg/dL — ABNORMAL HIGH (ref 65–99)
Glucose-Capillary: 126 mg/dL — ABNORMAL HIGH (ref 65–99)
Glucose-Capillary: 148 mg/dL — ABNORMAL HIGH (ref 65–99)
Glucose-Capillary: 235 mg/dL — ABNORMAL HIGH (ref 65–99)

## 2016-03-10 LAB — BASIC METABOLIC PANEL
Anion gap: 19 — ABNORMAL HIGH (ref 5–15)
Anion gap: 17 — ABNORMAL HIGH (ref 5–15)
Anion gap: 12 (ref 5–15)
BUN: 14 mg/dL (ref 6–20)
BUN: 14 mg/dL (ref 6–20)
BUN: 17 mg/dL (ref 6–20)
CO2: 20 mmol/L — ABNORMAL LOW (ref 22–32)
CO2: 21 mmol/L — ABNORMAL LOW (ref 22–32)
CO2: 23 mmol/L (ref 22–32)
Calcium: 9.1 mg/dL (ref 8.9–10.3)
Calcium: 9.1 mg/dL (ref 8.9–10.3)
Calcium: 8.8 mg/dL — ABNORMAL LOW (ref 8.9–10.3)
Chloride: 96 mmol/L — ABNORMAL LOW (ref 101–111)
Chloride: 95 mmol/L — ABNORMAL LOW (ref 101–111)
Chloride: 100 mmol/L — ABNORMAL LOW (ref 101–111)
Creatinine, Ser: 1.36 mg/dL — ABNORMAL HIGH (ref 0.61–1.24)
Creatinine, Ser: 1.54 mg/dL — ABNORMAL HIGH (ref 0.61–1.24)
Creatinine, Ser: 1.44 mg/dL — ABNORMAL HIGH (ref 0.61–1.24)
GFR calc Af Amer: >60 mL/min (ref >60)
GFR calc Af Amer: 55 mL/min — ABNORMAL LOW (ref >60)
GFR calc Af Amer: 60 mL/min — ABNORMAL LOW (ref >60)
GFR calc non Af Amer: 55 mL/min — ABNORMAL LOW (ref >60)
GFR calc non Af Amer: 47 mL/min — ABNORMAL LOW (ref >60)
GFR calc non Af Amer: 51 mL/min — ABNORMAL LOW (ref >60)
Glucose, Bld: 503 mg/dL — ABNORMAL HIGH (ref 65–99)
Glucose, Bld: 480 mg/dL — ABNORMAL HIGH (ref 65–99)
Glucose, Bld: 355 mg/dL — ABNORMAL HIGH (ref 65–99)
Potassium: 2.9 mmol/L — ABNORMAL LOW (ref 3.5–5.1)
Potassium: 3 mmol/L — ABNORMAL LOW (ref 3.5–5.1)
Potassium: 3 mmol/L — ABNORMAL LOW (ref 3.5–5.1)
Sodium: 135 mmol/L (ref 135–145)
Sodium: 133 mmol/L — ABNORMAL LOW (ref 135–145)
Sodium: 135 mmol/L (ref 135–145)

## 2016-03-10 LAB — TROPONIN I
TROPONIN I: 0.29 ng/mL — AB (ref <0.031)
TROPONIN I: 0.29 ng/mL — AB (ref <0.031)
Troponin I: 0.22 ng/mL — ABNORMAL HIGH (ref <0.031)
Troponin I: 0.24 ng/mL — ABNORMAL HIGH (ref <0.031)

## 2016-03-10 LAB — MAGNESIUM: Magnesium: 2.3 mg/dL (ref 1.7–2.4)

## 2016-03-10 MED ORDER — METOPROLOL TARTRATE 50 MG PO TABS
50.0000 mg | ORAL_TABLET | Freq: Two times a day (BID) | ORAL | Status: DC
  Administered 2016-03-10 – 2016-03-13 (×6): 50 mg via ORAL
  Filled 2016-03-10 (×6): qty 1

## 2016-03-10 MED ORDER — INSULIN ASPART 100 UNIT/ML ~~LOC~~ SOLN
0.0000 [IU] | Freq: Every day | SUBCUTANEOUS | Status: DC
  Administered 2016-03-10 – 2016-03-11 (×2): 2 [IU] via SUBCUTANEOUS

## 2016-03-10 MED ORDER — SODIUM CHLORIDE 0.9 % IV SOLN
INTRAVENOUS | Status: DC
  Administered 2016-03-10: 4.5 [IU]/h via INTRAVENOUS
  Filled 2016-03-10: qty 2.5

## 2016-03-10 MED ORDER — POTASSIUM CHLORIDE 10 MEQ/100ML IV SOLN
10.0000 meq | INTRAVENOUS | Status: AC
  Administered 2016-03-10 (×4): 10 meq via INTRAVENOUS
  Filled 2016-03-10 (×4): qty 100

## 2016-03-10 MED ORDER — SODIUM CHLORIDE 0.9 % IV SOLN
INTRAVENOUS | Status: DC
  Administered 2016-03-10: 04:00:00 via INTRAVENOUS

## 2016-03-10 MED ORDER — POTASSIUM CHLORIDE 10 MEQ/100ML IV SOLN
10.0000 meq | INTRAVENOUS | Status: AC
  Administered 2016-03-10 (×5): 10 meq via INTRAVENOUS
  Filled 2016-03-10 (×5): qty 100

## 2016-03-10 MED ORDER — INSULIN ASPART 100 UNIT/ML ~~LOC~~ SOLN
0.0000 [IU] | Freq: Three times a day (TID) | SUBCUTANEOUS | Status: DC
  Administered 2016-03-10: 2 [IU] via SUBCUTANEOUS
  Administered 2016-03-11: 5 [IU] via SUBCUTANEOUS
  Administered 2016-03-11: 8 [IU] via SUBCUTANEOUS
  Administered 2016-03-11: 5 [IU] via SUBCUTANEOUS
  Administered 2016-03-12: 3 [IU] via SUBCUTANEOUS
  Administered 2016-03-12: 5 [IU] via SUBCUTANEOUS
  Administered 2016-03-12: 3 [IU] via SUBCUTANEOUS
  Administered 2016-03-13: 5 [IU] via SUBCUTANEOUS
  Administered 2016-03-13 (×2): 3 [IU] via SUBCUTANEOUS

## 2016-03-10 MED ORDER — SODIUM CHLORIDE 0.9 % IV SOLN
INTRAVENOUS | Status: DC
  Administered 2016-03-10: 03:00:00 via INTRAVENOUS

## 2016-03-10 MED ORDER — DEXTROSE-NACL 5-0.45 % IV SOLN
INTRAVENOUS | Status: DC
  Administered 2016-03-10: 09:00:00 via INTRAVENOUS

## 2016-03-10 MED ORDER — INSULIN GLARGINE 100 UNIT/ML ~~LOC~~ SOLN
16.0000 [IU] | Freq: Every day | SUBCUTANEOUS | Status: DC
  Administered 2016-03-10 – 2016-03-11 (×2): 16 [IU] via SUBCUTANEOUS
  Filled 2016-03-10 (×2): qty 0.16

## 2016-03-10 NOTE — Progress Notes (Addendum)
Inpatient Diabetes Program Recommendations  AACE/ADA: New Consensus Statement on Inpatient Glycemic Control (2015)  Target Ranges:  Prepandial:   less than 140 mg/dL      Peak postprandial:   less than 180 mg/dL (1-2 hours)      Critically ill patients:  140 - 180 mg/dL   Results for DUWANE, GEWIRTZ (MRN [id number]) as of 03/10/2016 12:05  Ref. Range 03/09/2016 13:56  Sodium Latest Ref Range: 135-145 mmol/L 132 (L)  Potassium Latest Ref Range: 3.5-5.1 mmol/L 2.7 (LL)  Chloride Latest Ref Range: 101-111 mmol/L 90 (L)  CO2 Latest Ref Range: 22-32 mmol/L 22  BUN Latest Ref Range: 6-20 mg/dL 8  Creatinine Latest Ref Range: 0.61-1.24 mg/dL 1.11  Calcium Latest Ref Range: 8.9-10.3 mg/dL 9.8  EGFR (Non-African Amer.) Latest Ref Range: >60 mL/min >60  EGFR (African American) Latest Ref Range: >60 mL/min >60  Glucose Latest Ref Range: 65-99 mg/dL 518 (H)  Anion gap Latest Ref Range: 5-15  20 (H)    Admit with: Seizures vs Vaso-vagal syncope (reportedly vomiting) vs Cardiac arrest  History: DM, Mental Retardation, CAD  Home DM Meds: Metformin 1000 mg bid        Glipizide 5 mg bid        Januvia 100 mg daily  Current Insulin Orders: IV Insulin drip (started at 3am)     -Current A1c pending.  -Note that patient eats cookies and drinks sodas per caregiver.  Will ask dietitian to speak with patient when appropriate to reinforce DM diet principles.  -Note that patient is currently disoriented per nursing assessment.  Will attempt to visit with patient once oriented.    MD- When patient ready to transition off IV insulin drip, please consider the following:  1. Start Lantus 16 units daily (0.2 units/kg dosing)- Give 1st dose at least 1 hour before IV insulin drip stopped  2. Start Novolog Moderate Correction Scale/ SSI (0-15 units) TID AC + HS  3. Note A1c pending- Will help determine if pt's current oral DM medication regimen is sufficient      --Will follow patient during  hospitalization--  Wyn Quaker RN, MSN, CDE Diabetes Coordinator Inpatient Glycemic Control Team Team Pager: [phone] (8a-5p)

## 2016-03-10 NOTE — Progress Notes (Signed)
Subjective: Still seems confused, but unclear baseline.   Exam: Filed Vitals:   03/09/16 2309 03/10/16 0400  BP: 137/85 110/71  Pulse:  108  Temp: 100.1 F (37.8 C) 98.5 F (36.9 C)  Resp:  15   Gen: In bed, NAD Resp: non-labored breathing, no acute distress Abd: soft, nt  Neuro: MS: awake, stutters CN:VFF Motor: MAEW Sensory:responds to nox stim.   Pertinent Labs: Elevated glucose.   Impression: 60 yo M with LOC, suspected seizure. He has been started on keppra and I think this is reasonable.   Recommendations: 1) continue keppra.  2) Will need to determine how close to baseline he is.   Ritta SlotMcNeill Kirkpatrick, MD [facility] [phone]  If 7pm- 7am, please page neurology on call as listed in AMION.

## 2016-03-10 NOTE — Evaluation (Signed)
Physical Therapy Evaluation Patient Details Name: ALPER GUILMETTE MRN: [id number] DOB: [birth year] Today's Date: 03/10/2016   History of Present Illness  GERHARD RAPPAPORT is a 60 y.o. male with medical history significant of obesity, CAD (3 vessel disease but no CABG), and mental retardation living in a group home.Group Home worker reports patient was in the bathroom "throwing up" and they heard a "yell" and then a crash and then found him on the floor. When they arrived, he appeared to be having a seizure. He had cuts to the right side of his face. He was also confused as he "came to". He slowly in the ER became less confused. Dr. Madilyn Hook also reported that Group home workers were unable to find a pulse so they did CPR. When EMS arrived and they stopped CPR, he had a pulse and was combative.   Clinical Impression  Pt admitted with above diagnosis. Pt currently with functional limitations due to the deficits listed below (see PT Problem List). Pt was able to ambulate a short distance but needed mod assist due to significant balance issues.  Will need SNF for rehab as pt is very impaired cognitively and functionally.  Will follow acutely.   Pt will benefit from skilled PT to increase their independence and safety with mobility to allow discharge to the venue listed below.      Follow Up Recommendations SNF;Supervision/Assistance - 24 hour    Equipment Recommendations  Other (comment) (TBA)    Recommendations for Other Services       Precautions / Restrictions Precautions Precautions: Fall Restrictions Weight Bearing Restrictions: No      Mobility  Bed Mobility Overal bed mobility: Needs Assistance;+2 for physical assistance Bed Mobility: Supine to Sit     Supine to sit: Mod assist;+2 for physical assistance     General bed mobility comments: Needed mod assist for LEs and for elevation of trunk. Pt resisting initially and then once he initiates movment, somewhat impulsive.     Transfers Overall transfer level: Needs assistance Equipment used: 2 person hand held assist Transfers: Sit to/from Stand Sit to Stand: Mod assist;+2 physical assistance         General transfer comment: Pt with heavy posterior lean.  Attempted to provide anterior persuasion with +2 HHA but was very difficult due to heavy posterior lean.  Was able to get enough anterior lean to take steps but continued to vary between min to mod assist for stability as pt all over the place with balance.    Ambulation/Gait Ambulation/Gait assistance: Mod assist;+2 physical assistance Ambulation Distance (Feet): 12 Feet Assistive device: 2 person hand held assist Gait Pattern/deviations: Step-to pattern;Decreased step length - right;Decreased step length - left;Shuffle;Ataxic;Staggering left;Staggering right;Drifts right/left;Narrow base of support   Gait velocity interpretation: Below normal speed for age/gender General Gait Details: Pt with very uncoordinated steps needing assist to ambulate 12 feet.  Pt with heavy posterior lean needing mod assist to lean forward and for stability.  Pt having difficulty following commands needing constant cues to ambulate.  Limited distance due to unsteady gait.   Stairs            Wheelchair Mobility    Modified Rankin (Stroke Patients Only)       Balance Overall balance assessment: Needs assistance;History of Falls Sitting-balance support: Bilateral upper extremity supported;Feet supported Sitting balance-Leahy Scale: Poor Sitting balance - Comments: Pt needs UE support for balance.  Heavy posterior lean.  Postural control: Posterior lean Standing balance support: Bilateral upper  extremity supported;During functional activity Standing balance-Leahy Scale: Poor Standing balance comment: Pt required bil UE support. Heavy posterior lean.                              Pertinent Vitals/Pain Pain Assessment: No/denies pain  VSS    Home  Living Family/patient expects to be discharged to:: Skilled nursing facility Living Arrangements: Group Home Available Help at Discharge: Available PRN/intermittently Type of Home: House Home Access: Stairs to enter   Entergy CorporationEntrance Stairs-Number of Steps: 3 Home Layout: One level Home Equipment: None      Prior Function Level of Independence: Independent         Comments: Per Allen NorrisFrances [place], pt was able to get around independently.  Pt talked all the time per pt and was not confused.  She said he only needed assist for shaving.  [place]       Hand Dominance        Extremity/Trunk Assessment   Upper Extremity Assessment: Defer to OT evaluation           Lower Extremity Assessment: Generalized weakness         Communication   Communication: Expressive difficulties;Other (comment) (Pt perseverating.  )  Cognition Arousal/Alertness: Lethargic Behavior During Therapy: Flat affect;Impulsive;Restless Overall Cognitive Status: History of cognitive impairments - at baseline                      General Comments      Exercises        Assessment/Plan    PT Assessment Patient needs continued PT services  PT Diagnosis Generalized weakness   PT Problem List Decreased activity tolerance;Decreased balance;Decreased mobility;Decreased knowledge of use of DME;Decreased coordination;Decreased cognition;Decreased strength;Decreased knowledge of precautions;Decreased safety awareness  PT Treatment Interventions DME instruction;Gait training;Functional mobility training;Therapeutic activities;Therapeutic exercise;Balance training;Patient/family education   PT Goals (Current goals can be found in the Care Plan section) Acute Rehab PT Goals Patient Stated Goal: unable to state PT Goal Formulation: Patient unable to participate in goal setting Time For Goal Achievement: 03/24/16 Potential to Achieve Goals: Fair    Frequency  Min 3X/week   Barriers to discharge Decreased caregiver support (lived in group home independent)      Co-evaluation               End of Session Equipment Utilized During Treatment: Gait belt Activity Tolerance: Patient limited by fatigue;Patient limited by lethargy Patient left: in bed;with call bell/phone within reach;with bed alarm set;with restraints reapplied Nurse Communication: Mobility status         Time: 1113-1130 PT Time Calculation (min) (ACUTE ONLY): 17 min   Charges:   PT Evaluation $PT Eval Moderate Complexity: 1 Procedure     PT G CodesBerline Lopes:        Wood Novacek F 03/10/2016, 1:37 PM Opal Mckellips [facility],PT Acute Rehabilitation [phone])

## 2016-03-10 NOTE — Evaluation (Signed)
Clinical/Bedside Swallow Evaluation Patient Details  Name: Larry Riggs MRN: [id number] Date of Birth: 1956/03/16  Today's Date: 03/10/2016 Time: SLP Start Time (ACUTE ONLY): 1037 SLP Stop Time (ACUTE ONLY): 1050 SLP Time Calculation (min) (ACUTE ONLY): 13 min  Past Medical History:  Past Medical History  Diagnosis Date  . Obesity   . Mildly mentally retarded   . Coronary atherosclerosis of native coronary vessel     a. 08/2013 Cath: signif 3V dzs w EF of 55%, eval @ [facility] for CABG but felt that there was no good target in the LAD;  b. 12/2013 Cath/PCI: LM nl, LAD 728m (PTCA), 95d (attempted PTCA), subtl distal, D1 95 (2.75x16 Promus DES), D2 90ost, D3 80-90ost, D4 60ost, LCX 90p, small, RCA 6134m, PDA nl, RPL1 60, RPL2 90ost.  . Hyperlipidemia   . Hypertension   . Asthma   . Type II diabetes mellitus (HCC)   . GERD (gastroesophageal reflux disease)   . Dysphagia    Past Surgical History:  Past Surgical History  Procedure Laterality Date  . Inguinal hernia repair Right 1990's  . Cardiac catheterization  08/2013    [facility]  . Coronary angioplasty with stent placement  12/27/2013    "1"  . Percutaneous coronary stent intervention (pci-s) N/A 12/27/2013    Procedure: PERCUTANEOUS CORONARY STENT INTERVENTION (PCI-S);  Surgeon: Iran OuchMuhammad A Arida, MD;  [facility];  Service: Cardiovascular;  Laterality: N/A;   HPI:  60 y.o. male with medical history significant of obesity, CAD (3 vessel disease but no CABG), GERD, esophageal dysphagia (barium swallow 12/2013: spasm of the upper-mid thoracic esophagus which temporarily restricted the passage of the barium tablet; Mild gastroesophageal reflux; Small hiatal hernia), and intellectual disability living in a group home admitted with fever, possible seizure.    Assessment / Plan / Recommendation Clinical Impression  Pt presents with generally functional swallow, impacted primarily by MS and perseveratory behavior.  He is protecting his airway  with consumption of thin liquids and purees.  There are no focal CN deficits.  After drinking from a straw, he was unable to shift motor response required for eating a cracker, instead repeatedly attempting to drink from the cracker despite max cues to shift set.  No s/s of aspiration.  Recommend initiating a dysphagia 2 diet with thin liquids; meds whole in liquid.  Assist with meals given MS.  SLP will briefly follow for safety/diet advancement.     Aspiration Risk  Mild aspiration risk    Diet Recommendation   dysphagia 2, thin liquids.  Straws okay.   Medication Administration: Whole meds with liquid    Other  Recommendations Oral Care Recommendations: Oral care BID   Follow up Recommendations  None    Frequency and Duration min 2x/week  1 week       Prognosis Prognosis for Safe Diet Advancement: Good      Swallow Study   General Date of Onset: 03/09/16 HPI: 60 y.o. male with medical history significant of obesity, CAD (3 vessel disease but no CABG), GERD, esophageal dysphagia (barium swallow 12/2013: spasm of the upper-mid thoracic esophagus which temporarily restricted the passage of the barium tablet; Mild gastroesophageal reflux; Small hiatal hernia), and intellectual disability living in a group home admitted with fever, possible seizure.  Type of Study: Bedside Swallow Evaluation Previous Swallow Assessment: no Diet Prior to this Study: NPO Temperature Spikes Noted: No Respiratory Status: Room air History of Recent Intubation: No Behavior/Cognition: Alert Oral Cavity Assessment: Dried secretions Oral Care  Completed by SLP: Yes Oral Cavity - Dentition: Adequate natural dentition Vision: Functional for self-feeding Self-Feeding Abilities: Needs assist Patient Positioning: Upright in bed Baseline Vocal Quality: Normal Volitional Cough: Strong Volitional Swallow: Able to elicit    Oral/Motor/Sensory Function Overall Oral Motor/Sensory Function: Within functional limits    Ice Chips Ice chips: Within functional limits   Thin Liquid Thin Liquid: Within functional limits    Nectar Thick Nectar Thick Liquid: Not tested   Honey Thick Honey Thick Liquid: Not tested   Puree Puree: Within functional limits   Solid   GO   Solid:  (unable to masticate)       Larry Riggs L. Samson Frederic, MA CCC/SLP Pager [phone]  Blenda Mounts Laurice 03/10/2016,10:54 AM

## 2016-03-10 NOTE — Progress Notes (Signed)
EEG completed, results pending. 

## 2016-03-10 NOTE — Procedures (Signed)
HPI:  60 y/o with hx of MR found on floor with seizure like activity  TECHNICAL SUMMARY:  A multichannel referential and bipolar montage EEG using the standard international 10-20 system was performed on the patient described as confused.  The dominant background activity consists of a disorganized 7 hertz activity seen over all head regions.  The backgound activity is nonreactive to eye opening and closing procedures.  Low voltage fast (beta) activity is distributed symmetrically and maximally over the anterior head regions.  ACTIVATION:  Stepwise photic stimulation and hyperventilation are not performed.  EPILEPTIFORM ACTIVITY:  There were no spikes, sharp waves or paroxysmal activity.  SLEEP:  No sleep  CARDIAC:  The EKG lead revealed a regular sinus rhythm.  IMPRESSION:  This is an abnormal EEG demonstrating a mild diffuse slowing of electrocerebral activity.  This can be seen in a wide variety of encephalopathic state including those of a toxic, metabolic, or degenerative nature.  There were no focal, hemispheric, or lateralizing features.  No epileptiform activity was recorded.

## 2016-03-10 NOTE — Clinical Social Work Note (Signed)
Clinical Social Work Assessment  Patient Details  Name: Larry Riggs H Puccinelli MRN: [id number] Date of Birth: [birth year]  Date of referral:  03/10/16               Reason for consult:  Facility Placement                Permission sought to share information with:  Facility Medical sales representativeContact Representative, Family Supports Permission granted to share information::     Name::      Rondell Reams(David Ehle)  Agency::   [employer]  Relationship::   (brother)  Contact Information:   [phone])  Housing/Transportation Living arrangements for the past 2 months:  Group Home Source of Information:   (brother) Patient Interpreter Needed:  None Criminal Activity/Legal Involvement Pertinent to Current Situation/Hospitalization:  No - Comment as needed Significant Relationships:  Siblings Lives with:  Facility Resident Do you feel safe going back to the place where you live?  Yes Need for family participation in patient care:  Yes (Comment)  Care giving concerns:  Patient returning back to group home.   Social Worker assessment / plan:  BSW intern completed assessment via telephone with patient brother Rondell ReamsDavid Deblanc. Patient brother is agreeable to brother returning back to group home when medically ready. LCSWA Osborne Cascoadia also spoke with the group home in regards to taking patient back and they have agreed. They will be providing transportation to patient when ready for d/c.   Employment status:  Disabled (Comment on whether or not currently receiving Disability) Insurance information:  Medicare PT Recommendations:  Skilled Nursing Facility Information / Referral to community resources:   (group home)  Patient/Family's Response to care:  Patient brother has a good response to brother returning back to group home.   Patient/Family's Understanding of and Emotional Response to Diagnosis, Current Treatment, and Prognosis:  Patient brother has a good insight on patient current condition and treatment plans.  Emotional  Assessment Appearance:   (unable to assess) Attitude/Demeanor/Rapport:  Unable to Assess Affect (typically observed):  Unable to Assess Orientation:  Oriented to Self Alcohol / Substance use:  Never Used Psych involvement (Current and /or in the community):  No (Comment)  Discharge Needs  Concerns to be addressed:  No discharge needs identified Readmission within the last 30 days:  No Current discharge risk:  None Barriers to Discharge:  No Barriers Identified   Catheryn Baconharlean McNeil  BSW intern  [phone]

## 2016-03-10 NOTE — [place] FL2 (Signed)
[place] MEDICAID FL2 LEVEL OF CARE SCREENING TOOL     IDENTIFICATION  Patient Name: Larry Riggs Birthdate: Jun 05, 1956 Sex: male Admission Date (Current Location): 03/09/2016  [facility] [place]      Facility and Address:  [facility], [address], [place], [place] [address]      Provider Number:    Attending Physician Name and Address:  Joseph ArtJessica U Vann, DO  Relative Name and Phone Number:   Onalee Hua(david Skilling [phone]))    Current Level of Care: Hospital Recommended Level of Care: Other (Comment) (group home) Prior Approval Number:    Date Approved/Denied:   PASRR Number:    Discharge Plan: Other (Comment) (group home)    Current Diagnoses: Patient Active Problem List   Diagnosis Date Noted  . Syncope and collapse 03/09/2016  . Seizures (HCC) 03/09/2016  . Hypokalemia 03/09/2016  . Hyperglycemia 03/09/2016  . Syncope 03/09/2016  . Angina, class III (HCC) 12/28/2013  . Presence of drug coated stent (Promus Premier DES 2.75 x 16) in D1 branch of LAD coronary artery; PTCA of LAD 12/28/2013  . Type II diabetes mellitus (HCC)   . Asthma   . Angina effort (HCC) 12/27/2013  . Coronary atherosclerosis of native coronary vessel   . Hyperlipidemia   . Hypertension     Orientation RESPIRATION BLADDER Height & Weight     Self  Normal Continent Weight: 179 lb 14.3 oz (81.6 kg) Height:  5\' 10"  (177.8 cm)  BEHAVIORAL SYMPTOMS/MOOD NEUROLOGICAL BOWEL NUTRITION STATUS      Continent Diet (regular)  AMBULATORY STATUS COMMUNICATION OF NEEDS Skin   Limited Assist Verbally Normal                       Personal Care Assistance Level of Assistance  Bathing, Dressing, Feeding Bathing Assistance: Limited assistance Feeding assistance: Independent Dressing Assistance: Limited assistance     Functional Limitations Info  Sight, Hearing, Speech Sight Info: Adequate Hearing Info: Adequate Speech Info: Adequate    SPECIAL CARE  FACTORS FREQUENCY  PT (By licensed PT)     PT Frequency: Min 3X/week              Contractures      Additional Factors Info  Code Status, Allergies Code Status Info: full Allergies Info: NKA           Current Medications (03/10/2016):  This is the current hospital active medication list Current Facility-Administered Medications  Medication Dose Route Frequency Provider Last Rate Last Dose  . 0.9 %  sodium chloride infusion   Intravenous Continuous Leanne ChangKatherine P Schorr, NP   Stopped at 03/10/16 [phone]  . acetaminophen (TYLENOL) tablet 650 mg  650 mg Oral Q6H PRN Joseph ArtJessica U Vann, DO   650 mg at 03/09/16 1848   Or  . acetaminophen (TYLENOL) suppository 650 mg  650 mg Rectal Q6H PRN Joseph ArtJessica U Vann, DO   650 mg at 03/10/16 0145  . albuterol (PROVENTIL) (2.5 MG/3ML) 0.083% nebulizer solution 2.5 mg  2.5 mg Nebulization Q6H PRN Belinda Fisheraylor P Stone, RPH      . amLODipine (NORVASC) tablet 2.5 mg  2.5 mg Oral Daily Joseph ArtJessica U Vann, DO   2.5 mg at 03/09/16 1705  . atorvastatin (LIPITOR) tablet 40 mg  40 mg Oral Daily Joseph ArtJessica U Vann, DO   40 mg at 03/09/16 1849  . clopidogrel (PLAVIX) tablet 75 mg  75 mg Oral Daily Joseph ArtJessica U Vann, DO   75  mg at 03/09/16 1849  . dextrose 5 %-0.45 % sodium chloride infusion   Intravenous Continuous Roma Kayser Schorr, NP 75 mL/hr at 03/10/16 0909    . DULoxetine (CYMBALTA) DR capsule 120 mg  120 mg Oral Daily Joseph Art, DO   120 mg at 03/09/16 1848  . enoxaparin (LOVENOX) injection 40 mg  40 mg Subcutaneous Q24H Joseph Art, DO   40 mg at 03/09/16 1847  . hydrALAZINE (APRESOLINE) injection 10 mg  10 mg Intravenous Q6H PRN Joseph Art, DO      . insulin regular (NOVOLIN R,HUMULIN R) 250 Units in sodium chloride 0.9 % 250 mL (1 Units/mL) infusion   Intravenous Continuous Roma Kayser Schorr, NP 2.6 mL/hr at 03/10/16 1412 2.6 Units/hr at 03/10/16 1412  . levETIRAcetam (KEPPRA) 500 mg in sodium chloride 0.9 % 100 mL IVPB  500 mg Intravenous Q12H Charles Stewart    500 mg at 03/10/16 0454  . lisinopril (PRINIVIL,ZESTRIL) tablet 40 mg  40 mg Oral Daily Joseph Art, DO   40 mg at 03/09/16 1705  . metoprolol (LOPRESSOR) tablet 50 mg  50 mg Oral BID Joseph Art, DO   50 mg at 03/10/16 1250  . ondansetron (ZOFRAN) tablet 4 mg  4 mg Oral Q6H PRN Joseph Art, DO       Or  . ondansetron (ZOFRAN) injection 4 mg  4 mg Intravenous Q6H PRN Joseph Art, DO      . pantoprazole (PROTONIX) EC tablet 40 mg  40 mg Oral Daily Joseph Art, DO   40 mg at 03/09/16 1847  . potassium chloride SA (K-DUR,KLOR-CON) CR tablet 10 mEq  10 mEq Oral Daily Joseph Art, DO   10 mEq at 03/09/16 1848  . risperiDONE (RISPERDAL) tablet 4 mg  4 mg Oral Daily Joseph Art, DO   4 mg at 03/09/16 1849  . senna-docusate (Senokot-S) tablet 1 tablet  1 tablet Oral QHS PRN Joseph Art, DO      . sodium chloride flush (NS) 0.9 % injection 3 mL  3 mL Intravenous Q12H Jessica U Vann, DO   3 mL at 03/10/16 1023  . traZODone (DESYREL) tablet 50 mg  50 mg Oral QHS Joseph Art, DO   50 mg at 03/09/16 2142  . [START ON 03/15/2016] Vitamin D (Ergocalciferol) (DRISDOL) capsule 50,000 Units  50,000 Units Oral Q7 days Joseph Art, DO         Discharge Medications: Please see discharge summary for a list of discharge medications.  Relevant Imaging Results:  Relevant Lab Results:   Additional Information SS# [id number]  Catheryn Bacon  BSW intern  [phone]

## 2016-03-10 NOTE — Progress Notes (Addendum)
PROGRESS NOTE    Larry Riggs  ZOX:[id number] DOB: 1955-12-20 DOA: 03/09/2016 PCP: Sherrie Mustache, MD   Outpatient Specialists:     Brief Narrative:  Larry Riggs is a 60 y.o. male with medical history significant of obesity, CAD (3 vessel disease but no CABG), and mental retardation living in a group home. Group Home worker reports patient was in the bathroom "throwing up" and they heard a "yell" and then a crash and then found him on the floor. When they arrived, he appeared to be having a seizure. He had cuts to the right side of his face. He was also confused as he "came to". He slowly in the ER became less confused. Dr. Madilyn Hook also reported that Group home workers were unable to find a pulse so they did CPR. When EMS arrived and they stopped CPR, he had a pulse and was combative.   I was able to speak with main caregiver on 4/25 who states patient is not complaint with diet- has soda/cookies but his baseline is not confused- patient checks his own sugars and can answer questions appropriately   Assessment & Plan:   Active Problems:   Hypertension   Type II diabetes mellitus (HCC)   Syncope and collapse   Seizures (HCC)   Hypokalemia   Hyperglycemia   Syncope   Seizures vs vaso-vagal syncope (reportedly vomiting) vs cardiac arrest -cycle CE- elevated so will check another set-- ? From CPR- expect they should start decliing -echo -tele -EEG -MRI brain -seizure precautions PT/OT eval -not hypoxic so doubt PE  HTN- urgency -? Accuracy of reading as arms are stiffened and bent -resume home meds  Dysphagia -DYS 2 diet  Hypokalemia - Mg ok -repleat IV  DKA -gap > 15 -on insulin gtt -get HbA1C -transition to SQ insulin and SSI  Fever -? Etiology Check Urine   x ray chest ok Blood cultures pending -no sign of meningitis  AKI -IVF -monitor    DVT prophylaxis:  SQ Heparin  Code Status: Full Code   Family Communication: Called group  home  Disposition Plan:     Consultants:   neuro  Procedures:     Antimicrobials:      Subjective: More awake, following commands -did not answer questions appropriately -PT reports repetitive movements with bringing hand to mouth  Objective: Filed Vitals:   03/09/16 2000 03/09/16 2309 03/10/16 0358 03/10/16 0400  BP: 136/94 137/85  110/71  Pulse:    108  Temp: 99 F (37.2 C) 100.1 F (37.8 C)  98.5 F (36.9 C)  TempSrc: Oral Axillary  Axillary  Resp: 20   15  Height:      Weight:   81.6 kg (179 lb 14.3 oz)   SpO2: 100%       Intake/Output Summary (Last 24 hours) at 03/10/16 1129 Last data filed at 03/10/16 8119  Gross per 24 hour  Intake 1970.22 ml  Output      0 ml  Net 1970.22 ml   Filed Weights   03/09/16 1343 03/09/16 1809 03/10/16 0358  Weight: 90.719 kg (200 lb) 82.8 kg (182 lb 8.7 oz) 81.6 kg (179 lb 14.3 oz)    Examination:  General exam: follows commands, did not answer questions appropraitely Respiratory system: Clear to auscultation. Respiratory effort normal. Cardiovascular system: S1 & S2 heard, RRR. No JVD, murmurs, rubs, gallops or clicks. No pedal edema. Gastrointestinal system: Abdomen is nondistended, soft and nontender. No organomegaly or masses felt. Normal bowel sounds heard.  Skin: excoriations on b/l legs     Data Reviewed: I have personally reviewed following labs and imaging studies  CBC:  Recent Labs Lab 03/09/16 1356  WBC 11.8*  NEUTROABS 10.1*  HGB 18.2*  HCT 49.2  MCV 78.6  PLT 217   Basic Metabolic Panel:  Recent Labs Lab 03/09/16 1356 03/09/16 2322 03/10/16 0219 03/10/16 0530 03/10/16 0832  NA 132* 135 133* 135  --   K 2.7* 2.9* 3.0* 3.0*  --   CL 90* 96* 95* 100*  --   CO2 22 20* 21* 23  --   GLUCOSE 518* 503* 480* 355*  --   BUN --   CREATININE 1.11 1.36* 1.54* 1.44*  --   CALCIUM 9.8 9.1 9.1 8.8*  --   MG  --   --   --   --  2.3   GFR: Estimated Creatinine Clearance: 56.3  mL/min (by C-G formula based on Cr of 1.44). Liver Function Tests:  Recent Labs Lab 03/09/16 1356  AST 28  ALT 24  ALKPHOS 106  BILITOT 1.1  PROT 8.1  ALBUMIN 4.1   No results for input(s): LIPASE, AMYLASE in the last 168 hours. No results for input(s): AMMONIA in the last 168 hours. Coagulation Profile:  Recent Labs Lab 03/09/16 1418  INR 1.05   Cardiac Enzymes:  Recent Labs Lab 03/09/16 1822 03/09/16 2322 03/10/16 0530  TROPONINI 0.17* 0.22* 0.29*   BNP (last 3 results) No results for input(s): PROBNP in the last 8760 hours. HbA1C: No results for input(s): HGBA1C in the last 72 hours. CBG:  Recent Labs Lab 03/10/16 0604 03/10/16 0651 03/10/16 0743 03/10/16 0852 03/10/16 1006  GLUCAP 371* 303* 271* 211* 156*   Lipid Profile: No results for input(s): CHOL, HDL, LDLCALC, TRIG, CHOLHDL, LDLDIRECT in the last 72 hours. Thyroid Function Tests: No results for input(s): TSH, T4TOTAL, FREET4, T3FREE, THYROIDAB in the last 72 hours. Anemia Panel: No results for input(s): VITAMINB12, FOLATE, FERRITIN, TIBC, IRON, RETICCTPCT in the last 72 hours. Urine analysis: No results found for: COLORURINE, APPEARANCEUR, LABSPEC, PHURINE, GLUCOSEU, HGBUR, BILIRUBINUR, KETONESUR, PROTEINUR, UROBILINOGEN, NITRITE, LEUKOCYTESUR  Recent Results (from the past 240 hour(s))  MRSA PCR Screening     Status: None   Collection Time: 03/09/16  6:21 PM  Result Value Ref Range Status   MRSA by PCR NEGATIVE NEGATIVE Final    Comment:        The GeneXpert MRSA Assay (FDA approved for NASAL specimens only), is one component of a comprehensive MRSA colonization surveillance program. It is not intended to diagnose MRSA infection nor to guide or monitor treatment for MRSA infections.   Culture, blood (Routine X 2) w Reflex to ID Panel     Status: None (Preliminary result)   Collection Time: 03/09/16  6:36 PM  Result Value Ref Range Status   Specimen Description BLOOD RIGHT HAND   Final   Special Requests BOTTLES DRAWN AEROBIC AND ANAEROBIC 10CC EA  Final   Culture PENDING  Incomplete   Report Status PENDING  Incomplete  Culture, blood (Routine X 2) w Reflex to ID Panel     Status: None (Preliminary result)   Collection Time: 03/09/16  6:37 PM  Result Value Ref Range Status   Specimen Description BLOOD LEFT ANTECUBITAL  Final   Special Requests BOTTLES DRAWN AEROBIC ONLY 5CC EA  Final   Culture PENDING  Incomplete   Report Status PENDING  Incomplete      Anti-infectives  None       Radiology Studies: Ct Head Wo Contrast  03/09/2016  CLINICAL DATA:  Fall today, head injury EXAM: CT HEAD WITHOUT CONTRAST CT CERVICAL SPINE WITHOUT CONTRAST TECHNIQUE: Multidetector CT imaging of the head and cervical spine was performed following the standard protocol without intravenous contrast. Multiplanar CT image reconstructions of the cervical spine were also generated. COMPARISON:  None. FINDINGS: CT HEAD FINDINGS No skull fracture is noted. There is mucosal thickening with partial opacification bilateral maxillary sinus. The mastoid air cells are unremarkable. No intracranial hemorrhage, mass effect or midline shift. Mild cerebral atrophy. No acute cortical infarction. There is old appearing infarct in right posterior cerebellum measures about 1.8 cm. Atherosclerotic calcifications are noted left vertebral artery and basilar artery. Mild periventricular white matter decreased attenuation probable due to chronic small vessel ischemic changes. No mass lesion is noted on this unenhanced scan. CT CERVICAL SPINE FINDINGS Axial images of the cervical spine shows no acute fracture or subluxation. Computer processed images shows no acute fracture or subluxation. Degenerative changes are noted C1-C2 articulation. Mild disc space flattening with posterior spurring at C3-C4 level. Mild disc space flattening with mild anterior and mild posterior spurring at C4-C5 level. Moderate disc space  flattening with mild anterior and moderate posterior spurring at C6-C7 level. No prevertebral soft tissue swelling. Cervical airway is patent. There is no pneumothorax in visualized lung apices. IMPRESSION: 1. No acute intracranial abnormality. Mild cerebral atrophy. Mild periventricular white matter decreased attenuation probable due to chronic small vessel ischemic changes. Bilateral maxillary sinus mucosal thickening with partial opacification. 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 No definite acute cortical infarction. 2. No cervical spine acute fracture or subluxation. Multilevel degenerative changes as described above. Electronically Signed   By: Natasha MeadLiviu  Pop M.D.   On: 03/09/2016 14:58   Ct Cervical Spine Wo Contrast  03/09/2016  CLINICAL DATA:  Fall today, head injury EXAM: CT HEAD WITHOUT CONTRAST CT CERVICAL SPINE WITHOUT CONTRAST TECHNIQUE: Multidetector CT imaging of the head and cervical spine was performed following the standard protocol without intravenous contrast. Multiplanar CT image reconstructions of the cervical spine were also generated. COMPARISON:  None. FINDINGS: CT HEAD FINDINGS No skull fracture is noted. There is mucosal thickening with partial opacification bilateral maxillary sinus. The mastoid air cells are unremarkable. No intracranial hemorrhage, mass effect or midline shift. Mild cerebral atrophy. No acute cortical infarction. There is old appearing infarct in right posterior cerebellum measures about 1.8 cm. Atherosclerotic calcifications are noted left vertebral artery and basilar artery. Mild periventricular white matter decreased attenuation probable due to chronic small vessel ischemic changes. No mass lesion is noted on this unenhanced scan. CT CERVICAL SPINE FINDINGS Axial images of the cervical spine shows no acute fracture or subluxation. Computer processed images shows no acute fracture or subluxation. Degenerative changes are noted C1-C2  articulation. Mild disc space flattening with posterior spurring at C3-C4 level. Mild disc space flattening with mild anterior and mild posterior spurring at C4-C5 level. Moderate disc space flattening with mild anterior and moderate posterior spurring at C6-C7 level. No prevertebral soft tissue swelling. Cervical airway is patent. There is no pneumothorax in visualized lung apices. IMPRESSION: 1. No acute intracranial abnormality. Mild cerebral atrophy. Mild periventricular white matter decreased attenuation probable due to chronic small vessel ischemic changes. Bilateral maxillary sinus mucosal thickening with partial opacification. 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 No definite acute cortical infarction. 2. No cervical spine acute fracture or subluxation. Multilevel degenerative  changes as described above. Electronically Signed   By: Natasha Mead M.D.   On: 03/09/2016 14:58   Dg Chest Port 1 View  03/09/2016  CLINICAL DATA:  Seizure, vomiting EXAM: PORTABLE CHEST 1 VIEW COMPARISON:  12/15/2013 FINDINGS: Cardiac silhouette upper normal. Vascular pattern normal. Lungs clear. No effusions. IMPRESSION: No active disease. Electronically Signed   By: Esperanza Heir M.D.   On: 03/09/2016 15:46        Scheduled Meds: . amLODipine  2.5 mg Oral Daily  . atorvastatin  40 mg Oral Daily  . clopidogrel  75 mg Oral Daily  . DULoxetine  120 mg Oral Daily  . enoxaparin (LOVENOX) injection  40 mg Subcutaneous Q24H  . glipiZIDE  5 mg Oral BID AC  . levETIRAcetam  500 mg Intravenous Q12H  . lisinopril  40 mg Oral Daily  . metoprolol  5 mg Intravenous Q6H  . pantoprazole  40 mg Oral Daily  . potassium chloride  10 mEq Intravenous Q1 Hr x 5  . potassium chloride  10 mEq Oral Daily  . risperidone  4 mg Oral Daily  . sodium chloride flush  3 mL Intravenous Q12H  . traZODone  50 mg Oral QHS  . [START ON 03/15/2016] Vitamin D (Ergocalciferol)  50,000 Units Oral Q7 days   Continuous  Infusions: . sodium chloride Stopped (03/10/16 0909)  . dextrose 5 % and 0.45% NaCl 75 mL/hr at 03/10/16 0909  . insulin (NOVOLIN-R) infusion 4.8 Units/hr (03/10/16 1009)     LOS: 1 day    Time spent: 35 min    Andreka Stucki U Eily Louvier, DO [facility] Pager [phone]  If 7PM-7AM, please contact night-coverage [email or web address] Password [facility] 03/10/2016, 11:29 AM

## 2016-03-11 ENCOUNTER — Inpatient Hospital Stay (HOSPITAL_COMMUNITY): Payer: Medicare Other

## 2016-03-11 DIAGNOSIS — I639 Cerebral infarction, unspecified: Secondary | ICD-10-CM | POA: Insufficient documentation

## 2016-03-11 DIAGNOSIS — R55 Syncope and collapse: Secondary | ICD-10-CM

## 2016-03-11 LAB — BASIC METABOLIC PANEL
Anion gap: 9 (ref 5–15)
BUN: 15 mg/dL (ref 6–20)
CALCIUM: 9.4 mg/dL (ref 8.9–10.3)
CO2: 24 mmol/L (ref 22–32)
CREATININE: 1.25 mg/dL — AB (ref 0.61–1.24)
Chloride: 103 mmol/L (ref 101–111)
GFR calc Af Amer: >60 mL/min (ref >60)
GFR calc non Af Amer: >60 mL/min (ref >60)
Glucose, Bld: 204 mg/dL — ABNORMAL HIGH (ref 65–99)
Potassium: 3.1 mmol/L — ABNORMAL LOW (ref 3.5–5.1)
Sodium: 136 mmol/L (ref 135–145)

## 2016-03-11 LAB — GLUCOSE, CAPILLARY
GLUCOSE-CAPILLARY: 310 mg/dL — AB (ref 65–99)
Glucose-Capillary: 270 mg/dL — ABNORMAL HIGH (ref 65–99)
Glucose-Capillary: 238 mg/dL — ABNORMAL HIGH (ref 65–99)
Glucose-Capillary: 225 mg/dL — ABNORMAL HIGH (ref 65–99)
Glucose-Capillary: 211 mg/dL — ABNORMAL HIGH (ref 65–99)

## 2016-03-11 LAB — CBC
HEMATOCRIT: 42.6 % (ref 39.0–52.0)
Hemoglobin: 14.7 g/dL (ref 13.0–17.0)
MCH: 28.1 pg (ref 26.0–34.0)
MCHC: 34.5 g/dL (ref 30.0–36.0)
MCV: 81.3 fL (ref 78.0–100.0)
Platelets: 184 10*3/uL (ref 150–400)
RBC: 5.24 MIL/uL (ref 4.22–5.81)
RDW: 13.3 % (ref 11.5–15.5)
WBC: 10.9 10*3/uL — ABNORMAL HIGH (ref 4.0–10.5)

## 2016-03-11 LAB — ECHOCARDIOGRAM COMPLETE
HEIGHTINCHES: 70 in
WEIGHTICAEL: 3100.55 [oz_av]

## 2016-03-11 LAB — HEMOGLOBIN A1C
HEMOGLOBIN A1C: 13.7 % — AB (ref 4.8–5.6)
MEAN PLASMA GLUCOSE: 346 mg/dL

## 2016-03-11 LAB — TROPONIN I: TROPONIN I: 0.21 ng/mL — AB (ref <0.031)

## 2016-03-11 LAB — AMMONIA: Ammonia: 47 umol/L — ABNORMAL HIGH (ref 9–35)

## 2016-03-11 MED ORDER — PERFLUTREN LIPID MICROSPHERE
1.0000 mL | INTRAVENOUS | Status: AC | PRN
  Administered 2016-03-11: 2 mL via INTRAVENOUS
  Filled 2016-03-11: qty 10

## 2016-03-11 MED ORDER — INSULIN ASPART 100 UNIT/ML ~~LOC~~ SOLN
3.0000 [IU] | Freq: Three times a day (TID) | SUBCUTANEOUS | Status: DC
  Administered 2016-03-11 – 2016-03-13 (×7): 3 [IU] via SUBCUTANEOUS

## 2016-03-11 MED ORDER — POTASSIUM CHLORIDE CRYS ER 20 MEQ PO TBCR
40.0000 meq | EXTENDED_RELEASE_TABLET | ORAL | Status: AC
  Administered 2016-03-11 (×2): 40 meq via ORAL
  Filled 2016-03-11 (×2): qty 2

## 2016-03-11 MED ORDER — IOPAMIDOL (ISOVUE-370) INJECTION 76%
INTRAVENOUS | Status: AC
  Administered 2016-03-11: 50 mL
  Filled 2016-03-11: qty 50

## 2016-03-11 MED ORDER — INSULIN GLARGINE 100 UNIT/ML ~~LOC~~ SOLN
22.0000 [IU] | Freq: Every day | SUBCUTANEOUS | Status: DC
  Administered 2016-03-12: 22 [IU] via SUBCUTANEOUS
  Filled 2016-03-11: qty 0.22

## 2016-03-11 MED ORDER — INSULIN GLARGINE 100 UNIT/ML ~~LOC~~ SOLN
6.0000 [IU] | Freq: Once | SUBCUTANEOUS | Status: AC
  Administered 2016-03-11: 6 [IU] via SUBCUTANEOUS
  Filled 2016-03-11: qty 0.06

## 2016-03-11 MED ORDER — IOPAMIDOL (ISOVUE-370) INJECTION 76%
INTRAVENOUS | Status: AC
  Filled 2016-03-11: qty 50

## 2016-03-11 NOTE — Progress Notes (Signed)
Nutrition Consult/Brief Note  RD consulted for DM diet education.  Pt was not receptive to education.  Provided "Carbohydrate Counting for People with Diabetes" handout from the Academy of Nutrition and Dietetics.   Body mass index is 27.81 kg/(m^2). Patient meets criteria for Overweight based on current BMI.   Current diet order is Dysphagia 2, thin liquid diet.  Patient is consuming approximately 100% of meals at this time. Labs and medications reviewed.   No nutrition interventions warranted at this time. If nutrition issues arise, please consult RD.   Maureen ChattersKatie Kirsty Monjaraz, RD, LDN Pager #: [phone] After-Hours Pager #: [phone]

## 2016-03-11 NOTE — Progress Notes (Signed)
  Echocardiogram 2D Echocardiogram has been performed.  Larry Riggs, Larry Riggs 03/11/2016, 2:03 PM

## 2016-03-11 NOTE — Progress Notes (Addendum)
Subjective: Much better, no further seizures.   Exam: Filed Vitals:   03/11/16 0415 03/11/16 0839  BP: 121/70 122/77  Pulse:    Temp: 98.2 F (36.8 C) 98.7 F (37.1 C)  Resp: 14 18   Gen: In bed, NAD Resp: non-labored breathing, no acute distress Abd: soft, nt  Neuro: MS: awake, alert, interactive and appropriate.  CN:VFF Motor: MAEW Sensory: endorses symmetric sensation.   Pertinent Labs: Elevated glucose.   Impression: 60 yo M with LOC, suspected seizure. MRI showing multiple thromboembolic appearing strokes. His continued improvement is reassuring. I suspect that his syncope was likley more related to this than a seizure and will stop keppra at this time.   1) D/C keppra 2) Echocardiogram 3) LDL, A1C 4) PT, OT.  5) Continue telemetry.  6) CTA head and neck 7) Stroke team to follow starting 4/27  Ritta SlotMcNeill Judith Demps, MD [facility] [phone]  If 7pm- 7am, please page neurology on call as listed in AMION.

## 2016-03-11 NOTE — Progress Notes (Signed)
PROGRESS NOTE    Larry Riggs  ZOX:[id number] DOB: [birth year] DOA: 03/09/2016 PCP: Sherrie Mustache, MD   Outpatient Specialists:     Brief Narrative:  Larry Riggs is a 60 y.o. male with medical history significant of obesity, CAD (3 vessel disease but no CABG), and mental retardation living in a group home. Group Home worker reports patient was in the bathroom "throwing up" and they heard a "yell" and then a crash and then found him on the floor. When they arrived, he appeared to be having a seizure. He had cuts to the right side of his face. He was also confused as he "came to". He slowly in the ER became less confused. Dr. Madilyn Hook also reported that Group home workers were unable to find a pulse so they did CPR. When EMS arrived and they stopped CPR, he had a pulse and was combative.   I was able to speak with main caregiver on 4/25 who states patient is not complaint with diet- has soda/cookies but his baseline is not confused- patient checks his own sugars and can answer questions appropriately   Assessment & Plan:   Active Problems:   Hypertension   Type II diabetes mellitus (HCC)   Syncope and collapse   Seizures (HCC)   Hypokalemia   Hyperglycemia   Syncope  Acute ischemic CVA - MRI brain significant for Multi focal acute subcentimeter ischemic infarcts involving the supratentorial and infratentorial brain Seizures vs vaso-vagal syncope (reportedly vomiting) vs cardiac arrest - Chest with the neurology, obtain CTA head and neck, 2-D echo, lipid panel and glycohemoglobin A1c. - Patient is already on statin and Plavix  Seizures vs vaso-vagal syncope (reportedly vomiting)  - Loss of consciousness most likely related to acute CVA more than actual seizures - Neurology consult appreciated, will discontinue Keppra - continue telemetry  Elevated troponin - Likely on the setting of CPR performed at facility, as well secondary to CVA, upon is trending down 0.29> 0.24>  0.21  HTN- urgency -? Accuracy of reading as arms are stiffened and bent - Septal on home medication  Dysphagia -DYS 2 diet  Hypokalemia - Repleted, recheck in a.m.   DKA -gap > 15, initially on insulin drip - HbA1C is 13.7 -CBG remained uncontrolled, will increase Lantus from 16-22.  Fever - patient  with temperature of 101 on 4/24, afebrile since, unclear etiology, chest x-ray with no acute findings, blood culture with no growth to date, no signs of meningitis, urinalysis still pending.  AKI - Proven with IV fluids    DVT prophylaxis:  SQ Heparin  Code Status: Full Code   Family Communication: None at bedside.  Disposition Plan:  Back to group home when stable.   Consultants:   neuro  Procedures:     Antimicrobials:      Subjective: Patient denies any complaints, no chest pain, no shortness of breath, no focal deficits  Objective: Filed Vitals:   03/11/16 0000 03/11/16 0415 03/11/16 0500 03/11/16 0839  BP: 99/64 121/70  122/77  Pulse:      Temp: 97.5 F (36.4 C) 98.2 F (36.8 C)  98.7 F (37.1 C)  TempSrc: Axillary Oral  Oral  Resp: 12 14  18   Height:      Weight:   87.9 kg (193 lb 12.6 oz)   SpO2:  100%  100%    Intake/Output Summary (Last 24 hours) at 03/11/16 1105 Last data filed at 03/11/16 0900  Gross per 24 hour  Intake 1728.19 ml  Output    675 ml  Net 1053.19 ml   Filed Weights   03/09/16 1809 03/10/16 0358 03/11/16 0500  Weight: 82.8 kg (182 lb 8.7 oz) 81.6 kg (179 lb 14.3 oz) 87.9 kg (193 lb 12.6 oz)    Examination:  General exam: Awake, alert, pleasant Respiratory system: Clear to auscultation. Respiratory effort normal. Cardiovascular system: S1 & S2 heard, RRR. No JVD, murmurs, rubs, gallops or clicks. No pedal edema. Gastrointestinal system: Abdomen is nondistended, soft and nontender. No organomegaly or masses felt. Normal bowel sounds heard. Skin: excoriations on b/l legs     Data Reviewed: I have  personally reviewed following labs and imaging studies  CBC:  Recent Labs Lab 03/09/16 1356 03/11/16 0057  WBC 11.8* 10.9*  NEUTROABS 10.1*  --   HGB 18.2* 14.7  HCT 49.2 42.6  MCV 78.6 81.3  PLT 217 184   Basic Metabolic Panel:  Recent Labs Lab 03/09/16 1356 03/09/16 2322 03/10/16 0219 03/10/16 0530 03/10/16 0832 03/11/16 0057  NA 132* 135 133* 135  --  136  K 2.7* 2.9* 3.0* 3.0*  --  3.1*  CL 90* 96* 95* 100*  --  103  CO2 22 20* 21* 23  --  24  GLUCOSE 518* 503* 480* 355*  --  204*  BUN --  15  CREATININE 1.11 1.36* 1.54* 1.44*  --  1.25*  CALCIUM 9.8 9.1 9.1 8.8*  --  9.4  MG  --   --   --   --  2.3  --    GFR: Estimated Creatinine Clearance: 70.2 mL/min (by C-G formula based on Cr of 1.25). Liver Function Tests:  Recent Labs Lab 03/09/16 1356  AST 28  ALT 24  ALKPHOS 106  BILITOT 1.1  PROT 8.1  ALBUMIN 4.1   No results for input(s): LIPASE, AMYLASE in the last 168 hours.  Recent Labs Lab 03/11/16 0057  AMMONIA 47*   Coagulation Profile:  Recent Labs Lab 03/09/16 1418  INR 1.05   Cardiac Enzymes:  Recent Labs Lab 03/09/16 2322 03/10/16 0530 03/10/16 1241 03/10/16 1855 03/11/16 0057  TROPONINI 0.22* 0.29* 0.29* 0.24* 0.21*   BNP (last 3 results) No results for input(s): PROBNP in the last 8760 hours. HbA1C:  Recent Labs  03/10/16 1241  HGBA1C 13.7*   CBG:  Recent Labs Lab 03/10/16 1627 03/10/16 1738 03/10/16 2214 03/11/16 0838 03/11/16 1038  GLUCAP 143* 148* 235* 310* 270*   Lipid Profile: No results for input(s): CHOL, HDL, LDLCALC, TRIG, CHOLHDL, LDLDIRECT in the last 72 hours. Thyroid Function Tests: No results for input(s): TSH, T4TOTAL, FREET4, T3FREE, THYROIDAB in the last 72 hours. Anemia Panel: No results for input(s): VITAMINB12, FOLATE, FERRITIN, TIBC, IRON, RETICCTPCT in the last 72 hours. Urine analysis: No results found for: COLORURINE, APPEARANCEUR, LABSPEC, PHURINE, GLUCOSEU, HGBUR,  BILIRUBINUR, KETONESUR, PROTEINUR, UROBILINOGEN, NITRITE, LEUKOCYTESUR  Recent Results (from the past 240 hour(s))  MRSA PCR Screening     Status: None   Collection Time: 03/09/16  6:21 PM  Result Value Ref Range Status   MRSA by PCR NEGATIVE NEGATIVE Final    Comment:        The GeneXpert MRSA Assay (FDA approved for NASAL specimens only), is one component of a comprehensive MRSA colonization surveillance program. It is not intended to diagnose MRSA infection nor to guide or monitor treatment for MRSA infections.   Culture, blood (Routine X 2) w Reflex to ID Panel     Status:  None (Preliminary result)   Collection Time: 03/09/16  6:36 PM  Result Value Ref Range Status   Specimen Description BLOOD RIGHT HAND  Final   Special Requests BOTTLES DRAWN AEROBIC AND ANAEROBIC 10CC  Final   Culture NO GROWTH < 24 HOURS  Final   Report Status PENDING  Incomplete  Culture, blood (Routine X 2) w Reflex to ID Panel     Status: None (Preliminary result)   Collection Time: 03/09/16  6:37 PM  Result Value Ref Range Status   Specimen Description BLOOD LEFT ANTECUBITAL  Final   Special Requests BOTTLES DRAWN AEROBIC ONLY 5CC  Final   Culture NO GROWTH < 24 HOURS  Final   Report Status PENDING  Incomplete      Anti-infectives    None       Radiology Studies: Ct Head Wo Contrast  03/09/2016  CLINICAL DATA:  Fall today, head injury EXAM: CT HEAD WITHOUT CONTRAST CT CERVICAL SPINE WITHOUT CONTRAST TECHNIQUE: Multidetector CT imaging of the head and cervical spine was performed following the standard protocol without intravenous contrast. Multiplanar CT image reconstructions of the cervical spine were also generated. COMPARISON:  None. FINDINGS: CT HEAD FINDINGS No skull fracture is noted. There is mucosal thickening with partial opacification bilateral maxillary sinus. The mastoid air cells are unremarkable. No intracranial hemorrhage, mass effect or midline shift. Mild cerebral atrophy. No  acute cortical infarction. There is old appearing infarct in right posterior cerebellum measures about 1.8 cm. Atherosclerotic calcifications are noted left vertebral artery and basilar artery. Mild periventricular white matter decreased attenuation probable due to chronic small vessel ischemic changes. No mass lesion is noted on this unenhanced scan. CT CERVICAL SPINE FINDINGS Axial images of the cervical spine shows no acute fracture or subluxation. Computer processed images shows no acute fracture or subluxation. Degenerative changes are noted C1-C2 articulation. Mild disc space flattening with posterior spurring at C3-C4 level. Mild disc space flattening with mild anterior and mild posterior spurring at C4-C5 level. Moderate disc space flattening with mild anterior and moderate posterior spurring at C6-C7 level. No prevertebral soft tissue swelling. Cervical airway is patent. There is no pneumothorax in visualized lung apices. IMPRESSION: 1. No acute intracranial abnormality. Mild cerebral atrophy. Mild periventricular white matter decreased attenuation probable due to chronic small vessel ischemic changes. Bilateral maxillary sinus mucosal thickening with partial opacification. 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 No definite acute cortical infarction. 2. No cervical spine acute fracture or subluxation. Multilevel degenerative changes as described above. Electronically Signed   By: Natasha Mead M.D.   On: 03/09/2016 14:58   Ct Cervical Spine Wo Contrast  03/09/2016  CLINICAL DATA:  Fall today, head injury EXAM: CT HEAD WITHOUT CONTRAST CT CERVICAL SPINE WITHOUT CONTRAST TECHNIQUE: Multidetector CT imaging of the head and cervical spine was performed following the standard protocol without intravenous contrast. Multiplanar CT image reconstructions of the cervical spine were also generated. COMPARISON:  None. FINDINGS: CT HEAD FINDINGS No skull fracture is noted. There is mucosal thickening  with partial opacification bilateral maxillary sinus. The mastoid air cells are unremarkable. No intracranial hemorrhage, mass effect or midline shift. Mild cerebral atrophy. No acute cortical infarction. There is old appearing infarct in right posterior cerebellum measures about 1.8 cm. Atherosclerotic calcifications are noted left vertebral artery and basilar artery. Mild periventricular white matter decreased attenuation probable due to chronic small vessel ischemic changes. No mass lesion is noted on this unenhanced scan. CT CERVICAL SPINE FINDINGS Axial images of  the cervical spine shows no acute fracture or subluxation. Computer processed images shows no acute fracture or subluxation. Degenerative changes are noted C1-C2 articulation. Mild disc space flattening with posterior spurring at C3-C4 level. Mild disc space flattening with mild anterior and mild posterior spurring at C4-C5 level. Moderate disc space flattening with mild anterior and moderate posterior spurring at C6-C7 level. No prevertebral soft tissue swelling. Cervical airway is patent. There is no pneumothorax in visualized lung apices. IMPRESSION: 1. No acute intracranial abnormality. Mild cerebral atrophy. Mild periventricular white matter decreased attenuation probable due to chronic small vessel ischemic changes. Bilateral maxillary sinus mucosal thickening with partial opacification. 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 No definite acute cortical infarction. 2. No cervical spine acute fracture or subluxation. Multilevel degenerative changes as described above. Electronically Signed   By: Natasha MeadLiviu  Pop M.D.   On: 03/09/2016 14:58   Mr Brain Wo Contrast  03/10/2016  CLINICAL DATA:  Initial evaluation for acute altered mental status, seizure. EXAM: MRI HEAD WITHOUT CONTRAST TECHNIQUE: Multiplanar, multiecho pulse sequences of the brain and surrounding structures were obtained without intravenous contrast. COMPARISON:  Prior  CT from 03/09/2016. FINDINGS: Diffuse prominence of the CSF containing spaces is compatible with generalized cerebral atrophy. Patchy and confluent T2/FLAIR hyperintensity within the periventricular and deep white matter most consistent with chronic small vessel ischemic disease, mild in nature. Chronic small vessel ischemic disease present within the pons as well. Scattered remote lacunar infarcts within the bilateral basal ganglia thalami. Remote lacunar infarcts within the central pons. There is a remote right cerebellar infarct. There are scattered sub cm multi focal acute ischemic infarcts involving the supratentorial and infratentorial brain. Specifically, 6 mm infarct within the right caudate head. Small 1 cm curvilinear infarct within the posterior right lentiform nucleus. Subcentimeter cortical infarct within the right operculum. Small periventricular ischemic infarct adjacent to the right lateral ventricle. Few small right temporal lobe infarcts noted. Few small periatrial white matter infarcts adjacent to the left lateral ventricle. Small subcentimeter cortical infarcts within the left temporal lobe. Additionally, there are a few small subcentimeter infratentorial infarcts involving the cerebellar hemispheres bilaterally. These are likely embolic in nature from a central source given the involvement of various vascular distributions. No associated mass effect or hemorrhage. Major intravascular flow voids are maintained. No mass lesion, midline shift, or mass effect. No hydrocephalus. No extra-axial fluid collection. Thin section imaging through the hippocampi demonstrates normal morphology and signal intensity bilaterally. Craniocervical junction within normal limits. Visualized upper cervical spine unremarkable. Pituitary gland within normal limits. No acute abnormality about the orbits. Scattered mucosal thickening throughout the paranasal sinuses, greatest within the maxillary sinuses. No air-fluid  levels to suggest active sinus infection. Mastoid air cells are clear. Inner ear structures normal. Bone marrow signal intensity within normal limits. No scalp soft tissue abnormality. IMPRESSION: 1. Multi focal acute subcentimeter ischemic infarcts involving the supratentorial and infratentorial brain as above. 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 2. Multiple chronic lacunar infarctions involving the bilateral basal ganglia, thalami, and brainstem, with additional remote right cerebellar infarct. 3. Age-related cerebral atrophy with mild chronic small vessel ischemic disease. Electronically Signed   By: Rise MuBenjamin  McClintock M.D.   On: 03/10/2016 22:59   Dg Chest Port 1 View  03/09/2016  CLINICAL DATA:  Seizure, vomiting EXAM: PORTABLE CHEST 1 VIEW COMPARISON:  12/15/2013 FINDINGS: Cardiac silhouette upper normal. Vascular pattern normal. Lungs clear. No effusions. IMPRESSION: No active disease. Electronically Signed  By: Esperanza Heir M.D.   On: 03/09/2016 15:46        Scheduled Meds: . amLODipine  2.5 mg Oral Daily  . atorvastatin  40 mg Oral Daily  . clopidogrel  75 mg Oral Daily  . DULoxetine  120 mg Oral Daily  . enoxaparin (LOVENOX) injection  40 mg Subcutaneous Q24H  . insulin aspart  0-15 Units Subcutaneous TID WC  . insulin aspart  0-5 Units Subcutaneous QHS  . insulin glargine  16 Units Subcutaneous Daily  . levETIRAcetam  500 mg Intravenous Q12H  . lisinopril  40 mg Oral Daily  . metoprolol  50 mg Oral BID  . pantoprazole  40 mg Oral Daily  . potassium chloride  10 mEq Oral Daily  . potassium chloride  40 mEq Oral Q4H  . risperidone  4 mg Oral Daily  . sodium chloride flush  3 mL Intravenous Q12H  . traZODone  50 mg Oral QHS  . [START ON 03/15/2016] Vitamin D (Ergocalciferol)  50,000 Units Oral Q7 days   Continuous Infusions: . sodium chloride Stopped (03/10/16 0909)  . dextrose 5 % and 0.45% NaCl 75 mL/hr at  03/10/16 0909  . insulin (NOVOLIN-R) infusion Stopped (03/10/16 1741)     LOS: 2 days    Time spent: 35 min    Mayuri Staples, MD [facility] Pager [phone]  If 7PM-7AM, please contact night-coverage [email or web address] Password [email or web address] 03/11/2016, 11:05 AM

## 2016-03-11 NOTE — Progress Notes (Signed)
Results for Larry Riggs, Larry Riggs (MRN [id number]) as of 03/11/2016 10:17  Ref. Range 03/10/2016 22:14 03/11/2016 08:38  Glucose-Capillary Latest Ref Range: 65-99 mg/dL [id number] (Riggs) [id number] (Riggs)  After transitioning off glucostabilizer, Lantus 16 units was given last night.  Fasting blood sugar this am is 310 mg/dl. Recommend increasing Lantus to 20-25 units daily, continuing Novolog MODERATE correction scale TID & HS, and adding Novolog 3-4 units TID meal coverage if patient eats at least 50% of meals. Will continue to monitor blood sugars while in the hospital. Smith MinceKendra Kenon Delashmit RN BSN CDE

## 2016-03-12 ENCOUNTER — Other Ambulatory Visit (HOSPITAL_COMMUNITY): Payer: Federal, State, Local not specified - PPO

## 2016-03-12 DIAGNOSIS — R778 Other specified abnormalities of plasma proteins: Secondary | ICD-10-CM | POA: Diagnosis present

## 2016-03-12 DIAGNOSIS — I10 Essential (primary) hypertension: Secondary | ICD-10-CM

## 2016-03-12 DIAGNOSIS — I6319 Cerebral infarction due to embolism of other precerebral artery: Secondary | ICD-10-CM

## 2016-03-12 DIAGNOSIS — E118 Type 2 diabetes mellitus with unspecified complications: Secondary | ICD-10-CM

## 2016-03-12 DIAGNOSIS — I2510 Atherosclerotic heart disease of native coronary artery without angina pectoris: Secondary | ICD-10-CM

## 2016-03-12 DIAGNOSIS — R7989 Other specified abnormal findings of blood chemistry: Secondary | ICD-10-CM | POA: Diagnosis present

## 2016-03-12 LAB — BASIC METABOLIC PANEL
Anion gap: 9 (ref 5–15)
BUN: 11 mg/dL (ref 6–20)
CHLORIDE: 105 mmol/L (ref 101–111)
CO2: 27 mmol/L (ref 22–32)
CREATININE: 0.95 mg/dL (ref 0.61–1.24)
Calcium: 9 mg/dL (ref 8.9–10.3)
GFR calc Af Amer: >60 mL/min (ref >60)
GFR calc non Af Amer: >60 mL/min (ref >60)
GLUCOSE: 176 mg/dL — AB (ref 65–99)
Potassium: 3.7 mmol/L (ref 3.5–5.1)
Sodium: 141 mmol/L (ref 135–145)

## 2016-03-12 LAB — URINE MICROSCOPIC-ADD ON
RBC / HPF: NONE SEEN RBC/hpf (ref 0–5)
WBC, UA: NONE SEEN WBC/hpf (ref 0–5)

## 2016-03-12 LAB — URINALYSIS, ROUTINE W REFLEX MICROSCOPIC
Bilirubin Urine: NEGATIVE
Glucose, UA: >1000 mg/dL — AB
HGB URINE DIPSTICK: NEGATIVE
Ketones, ur: NEGATIVE mg/dL
LEUKOCYTES UA: NEGATIVE
Nitrite: NEGATIVE
PROTEIN: NEGATIVE mg/dL
SPECIFIC GRAVITY, URINE: 1.035 — AB (ref 1.005–1.030)
pH: 6 (ref 5.0–8.0)

## 2016-03-12 LAB — LIPID PANEL
Cholesterol: 243 mg/dL — ABNORMAL HIGH (ref 0–200)
HDL: 33 mg/dL — ABNORMAL LOW (ref >40)
LDL Cholesterol: 174 mg/dL — ABNORMAL HIGH (ref 0–99)
TRIGLYCERIDES: 181 mg/dL — AB (ref <150)
Total CHOL/HDL Ratio: 7.4 RATIO
VLDL: 36 mg/dL (ref 0–40)

## 2016-03-12 LAB — CBC
HEMATOCRIT: 43.9 % (ref 39.0–52.0)
HEMOGLOBIN: 14.7 g/dL (ref 13.0–17.0)
MCH: 27.7 pg (ref 26.0–34.0)
MCHC: 33.5 g/dL (ref 30.0–36.0)
MCV: 82.8 fL (ref 78.0–100.0)
Platelets: 177 10*3/uL (ref 150–400)
RBC: 5.3 MIL/uL (ref 4.22–5.81)
RDW: 13.3 % (ref 11.5–15.5)
WBC: 6.8 10*3/uL (ref 4.0–10.5)

## 2016-03-12 LAB — GLUCOSE, CAPILLARY
GLUCOSE-CAPILLARY: 250 mg/dL — AB (ref 65–99)
GLUCOSE-CAPILLARY: 156 mg/dL — AB (ref 65–99)
GLUCOSE-CAPILLARY: 150 mg/dL — AB (ref 65–99)
Glucose-Capillary: 204 mg/dL — ABNORMAL HIGH (ref 65–99)

## 2016-03-12 MED ORDER — INSULIN GLARGINE 100 UNIT/ML ~~LOC~~ SOLN
5.0000 [IU] | Freq: Once | SUBCUTANEOUS | Status: AC
  Administered 2016-03-12: 5 [IU] via SUBCUTANEOUS
  Filled 2016-03-12: qty 0.05

## 2016-03-12 MED ORDER — INSULIN GLARGINE 100 UNIT/ML ~~LOC~~ SOLN
27.0000 [IU] | Freq: Every day | SUBCUTANEOUS | Status: DC
  Administered 2016-03-13: 27 [IU] via SUBCUTANEOUS
  Filled 2016-03-12: qty 0.27

## 2016-03-12 MED ORDER — INSULIN STARTER KIT- PEN NEEDLES (ENGLISH)
1.0000 | Freq: Once | Status: AC
  Administered 2016-03-12: 1
  Filled 2016-03-12: qty 1

## 2016-03-12 MED ORDER — ASPIRIN 81 MG PO CHEW
81.0000 mg | CHEWABLE_TABLET | Freq: Every day | ORAL | Status: DC
  Administered 2016-03-12 – 2016-03-13 (×2): 81 mg via ORAL
  Filled 2016-03-12 (×2): qty 1

## 2016-03-12 MED ORDER — LIVING WELL WITH DIABETES BOOK
Freq: Once | Status: AC
  Administered 2016-03-12: 12:00:00
  Filled 2016-03-12: qty 1

## 2016-03-12 NOTE — Progress Notes (Signed)
Patient administered the insulin but he requires a lot of supervision.  He stated that the people at group home are the one that gave him the shots.

## 2016-03-12 NOTE — Care Management Important Message (Signed)
Important Message  Patient Details  Name: Larry Riggs MRN: [id number] Date of Birth: 1956/05/16   Medicare Important Message Given:  Yes    Yvonne Petite Abena 03/12/2016, 1:32 PM

## 2016-03-12 NOTE — NC FL2 (Signed)
[place] MEDICAID FL2 LEVEL OF CARE SCREENING TOOL     IDENTIFICATION  Patient Name: Larry Riggs Birthdate: December 20, 1955 Sex: male Admission Date (Current Location): 03/09/2016  [facility] and [place] Number:      Facility and Address:  [facility], [place] [address], [place], [place] [address]      Provider Number:    Attending Physician Name and Address:  Albertine Patricia, MD  Relative Name and Phone Number:   Shanon Brow Pratt [phone]))    Current Level of Care: Hospital Recommended Level of Care: [place] Prior Approval Number:    Date Approved/Denied:   PASRR Number:    Discharge Plan: SNF    Current Diagnoses: Patient Active Problem List   Diagnosis Date Noted  . CVA (cerebral infarction)   . Syncope and collapse 03/09/2016  . Seizures ([place]) 03/09/2016  . Hypokalemia 03/09/2016  . Hyperglycemia 03/09/2016  . Syncope 03/09/2016  . Angina, class III ([place]) 12/28/2013  . Presence of drug coated stent (Promus Premier DES 2.75 x 16) in D1 branch of LAD coronary artery; PTCA of LAD 12/28/2013  . Type II diabetes mellitus ([place])   . Asthma   . Angina effort ([place]) 12/27/2013  . Coronary atherosclerosis of native coronary vessel   . Hyperlipidemia   . Hypertension     Orientation RESPIRATION BLADDER Height & Weight     Self  Normal Continent Weight: 191 lb 9.3 oz (86.9 kg) Height:  '5\' 10"'  (177.8 cm)  BEHAVIORAL SYMPTOMS/MOOD NEUROLOGICAL BOWEL NUTRITION STATUS      Continent Diet (regular)  AMBULATORY STATUS COMMUNICATION OF NEEDS Skin   Extensive Assist Verbally Normal                       Personal Care Assistance Level of Assistance  Bathing, Dressing, Feeding Bathing Assistance: Maximum assistance Feeding assistance: Maximum assistance Dressing Assistance: Maximum assistance     Functional Limitations Info  Sight, Hearing, Speech Sight Info: Adequate Hearing Info: Adequate Speech Info: Adequate     SPECIAL CARE FACTORS FREQUENCY  OT (By licensed OT), PT (By licensed PT)     PT Frequency: 5/wk OT Frequency: 5/wk            Contractures      Additional Factors Info  Code Status, Allergies Code Status Info: full Allergies Info: NKA           Current Medications (03/12/2016):  This is the current hospital active medication list Current Facility-Administered Medications  Medication Dose Route Frequency Provider Last Rate Last Dose  . acetaminophen (TYLENOL) tablet 650 mg  650 mg Oral Q6H PRN Geradine Girt, DO   650 mg at 03/09/16 1848   Or  . acetaminophen (TYLENOL) suppository 650 mg  650 mg Rectal Q6H PRN Geradine Girt, DO   650 mg at 03/10/16 0145  . albuterol (PROVENTIL) (2.5 MG/3ML) 0.083% nebulizer solution 2.5 mg  2.5 mg Nebulization Q6H PRN Assunta Found Stone, RPH      . amLODipine (NORVASC) tablet 2.5 mg  2.5 mg Oral Daily Geradine Girt, DO   2.5 mg at 03/12/16 1032  . atorvastatin (LIPITOR) tablet 40 mg  40 mg Oral Daily Jessica U Vann, DO   40 mg at 03/12/16 1030  . clopidogrel (PLAVIX) tablet 75 mg  75 mg Oral Daily Geradine Girt, DO   75 mg at 03/12/16 1033  . DULoxetine (CYMBALTA) DR capsule 120 mg  120 mg  Oral Daily Geradine Girt, DO   120 mg at 03/12/16 1030  . enoxaparin (LOVENOX) injection 40 mg  40 mg Subcutaneous Q24H Geradine Girt, DO   40 mg at 03/11/16 1844  . hydrALAZINE (APRESOLINE) injection 10 mg  10 mg Intravenous Q6H PRN Geradine Girt, DO      . insulin aspart (novoLOG) injection 0-15 Units  0-15 Units Subcutaneous TID WC Geradine Girt, DO   3 Units at 03/12/16 1251  . insulin aspart (novoLOG) injection 0-5 Units  0-5 Units Subcutaneous QHS Geradine Girt, DO   2 Units at 03/11/16 2137  . insulin aspart (novoLOG) injection 3 Units  3 Units Subcutaneous TID WC Albertine Patricia, MD   3 Units at 03/12/16 1250  . [START ON 03/13/2016] insulin glargine (LANTUS) injection 27 Units  27 Units Subcutaneous Daily Dawood S Elgergawy, MD      .  insulin starter kit- pen needles (English) 1 kit  1 kit Other Once Albertine Patricia, MD      . lisinopril (PRINIVIL,ZESTRIL) tablet 40 mg  40 mg Oral Daily Jessica U Vann, DO   40 mg at 03/12/16 1031  . metoprolol (LOPRESSOR) tablet 50 mg  50 mg Oral BID Geradine Girt, DO   50 mg at 03/12/16 1030  . ondansetron (ZOFRAN) tablet 4 mg  4 mg Oral Q6H PRN Geradine Girt, DO       Or  . ondansetron (ZOFRAN) injection 4 mg  4 mg Intravenous Q6H PRN Geradine Girt, DO      . pantoprazole (PROTONIX) EC tablet 40 mg  40 mg Oral Daily Geradine Girt, DO   40 mg at 03/12/16 1033  . potassium chloride SA (K-DUR,KLOR-CON) CR tablet 10 mEq  10 mEq Oral Daily Geradine Girt, DO   10 mEq at 03/12/16 1031  . risperiDONE (RISPERDAL) tablet 4 mg  4 mg Oral Daily Geradine Girt, DO   4 mg at 03/12/16 1033  . senna-docusate (Senokot-S) tablet 1 tablet  1 tablet Oral QHS PRN Geradine Girt, DO      . sodium chloride flush (NS) 0.9 % injection 3 mL  3 mL Intravenous Q12H Jessica U Vann, DO   3 mL at 03/12/16 1034  . traZODone (DESYREL) tablet 50 mg  50 mg Oral QHS Geradine Girt, DO   50 mg at 03/11/16 2137  . [START ON 03/15/2016] Vitamin D (Ergocalciferol) (DRISDOL) capsule 50,000 Units  50,000 Units Oral Q7 days Geradine Girt, DO         Discharge Medications: Please see discharge summary for a list of discharge medications.  Relevant Imaging Results:  Relevant Lab Results:   Additional Information SS# [id number]  Cranford Mon, [place]

## 2016-03-12 NOTE — Progress Notes (Signed)
STROKE TEAM PROGRESS NOTE   HISTORY OF PRESENT ILLNESS Larry Riggs is an 60 y.o. male with history of mild mental retardation, coronary artery disease, hyperlipidemia, hypertension, diabetes mellitus and dysphagia, brought to the ED after being found on the floor of the bathroom at the group home where he resides. He reportedly was vomiting and lost consciousness and fell. He reportedly demonstrated seizure-like activity on being found. There was also equivocal lack of pulse, and CPR was initiated. Pulse was present on arriving in the ED and CPR was discontinued. Patient was confused and agitated in the ED. He has remained confused. Glucose was 518 and potassium was 2.7. WBC count was 11.8. CT scan of the head showed no acute intracranial abnormality. White matter low vessel ischemic changes were noted, as well as old right cerebellar infarction. Patient has no history of seizure disorder.  He was admitted and started on Keppra. MRI done as part of the workup showed multiple thromboembolic infarcts. He has had continued improvement and Keppra was stopped as syncope was felt to be the etiology of symptoms. He was transferred to the stroke service D #3.   SUBJECTIVE (INTERVAL HISTORY) No family is at the bedside.  Overall he feels his condition is stable. He seems at baseline and he denies any hx of seizure, syncope or heart palpitation.    OBJECTIVE Temp:  [98.1 F (36.7 C)-98.7 F (37.1 C)] 98.3 F (36.8 C) (04/27 0748) Pulse Rate:  [78] 78 (04/26 2137) Cardiac Rhythm:  [-] Heart block (04/27 0700) Resp:  [12-18] 12 (04/27 0748) BP: (120-140)/(73-87) 140/80 mmHg (04/27 0748) SpO2:  [100 %] 100 % (04/26 1311) Weight:  [86.9 kg (191 lb 9.3 oz)] 86.9 kg (191 lb 9.3 oz) (04/27 0455)  CBC:   Recent Labs Lab 03/09/16 1356 03/11/16 0057 03/12/16 0309  WBC 11.8* 10.9* 6.8  NEUTROABS 10.1*  --   --   HGB 18.2* 14.7 14.7  HCT 49.2 42.6 43.9  MCV 78.6 81.3 82.8  PLT 217 184 177     Basic Metabolic Panel:   Recent Labs Lab 03/10/16 0832 03/11/16 0057 03/12/16 0309  NA  --  136 141  K  --  3.1* 3.7  CL  --  103 105  CO2  --  24 27  GLUCOSE  --  204* 176*  BUN  --  15 11  CREATININE  --  1.25* 0.95  CALCIUM  --  9.4 9.0  MG 2.3  --   --     Lipid Panel:     Component Value Date/Time   CHOL 243* 03/12/2016 0309   TRIG 181* 03/12/2016 0309   HDL 33* 03/12/2016 0309   CHOLHDL 7.4 03/12/2016 0309   VLDL 36 03/12/2016 0309   LDLCALC 174* 03/12/2016 0309   HgbA1c:  Lab Results  Component Value Date   HGBA1C 13.7* 03/10/2016   Urine Drug Screen: No results found for: LABOPIA, COCAINSCRNUR, LABBENZ, AMPHETMU, THCU, LABBARB    IMAGING I have personally reviewed the radiological images below and agree with the radiology interpretations.  Ct Angio Head & Neck W/cm &/or Wo Cm 03/11/2016  Extensive calcific atherosclerosis at both carotid bifurcations. 50% stenosis of the proximal ICA on the right and 40% stenosis of the proximal ICA on the left. 50% stenosis at both vertebral artery origins. Atherosclerotic disease in the carotid siphon regions bilaterally with stenosis estimated at 50% in those regions. Right vertebral artery terminates in PICA. Left vertebral artery supplies the basilar. 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 Severe atherosclerotic disease with focal stenosis estimated at 80% at the distal vertebral level. Serial stenoses of the basilar artery with the lumen measuring 1 mm or less.   Mr Brain Wo Contrast 03/10/2016  1. Multi focal acute subcentimeter ischemic infarcts involving the supratentorial and infratentorial brain as above. 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 2. Multiple chronic lacunar infarctions involving the bilateral basal ganglia, thalami, and brainstem, with additional remote right cerebellar infarct. 3. Age-related  cerebral atrophy with mild chronic small vessel ischemic disease.   EEG  abnormal demonstrating a mild diffuse slowing of electrocerebral activity. This can be seen in a wide variety of encephalopathic state including those of a toxic, metabolic, or degenerative nature. There were no focal, hemispheric, or lateralizing features. No epileptiform activity was recorded.  2D Echocardiogram  - Left ventricle: The cavity size was normal. There was mild concentric hypertrophy. Systolic function was mildly reduced. The estimated ejection fraction was in the range of 45% to 50%. There is dyskinesis of the apical myocardium. Doppler parameters are consistent with abnormal left ventricular relaxation (grade 1 diastolic dysfunction). - Aortic valve: Trileaflet; mildly thickened, mildly calcified leaflets.   Physical exam  Temp:  [97.8 F (36.6 C)-98.6 F (37 C)] 98.2 F (36.8 C) (04/27 2211) Pulse Rate:  [73-85] 73 (04/27 2211) Resp:  [12-16] 16 (04/27 2211) BP: (118-143)/(70-102) 118/75 mmHg (04/27 2211) SpO2:  [99 %-100 %] 99 % (04/27 2211) Weight:  [191 lb 9.3 oz (86.9 kg)] 191 lb 9.3 oz (86.9 kg) (04/27 0455)  General - Well nourished, well developed, in no apparent distress.  Ophthalmologic - Fundi not visualized due to noncooperation.  Cardiovascular - Regular rate and rhythm with no murmur.  Mental Status -  Level of arousal and orientation to time, place, and person were intact. Language including expression, naming, repetition, comprehension was assessed and found intact. Attention span and concentration were normal. Fund of Knowledge was assessed and was intact.  Cranial Nerves II - XII - II - Visual field intact OU. III, IV, VI - Extraocular movements intact. V - Facial sensation intact bilaterally. VII - Facial movement intact bilaterally. VIII - Hearing & vestibular intact bilaterally. X - Palate elevates symmetrically. XI - Chin turning & shoulder shrug intact  bilaterally. XII - Tongue protrusion intact.  Motor Strength - The patient's strength was normal in all extremities and pronator drift was absent.  Bulk was normal and fasciculations were absent.   Motor Tone - Muscle tone was assessed at the neck and appendages and was normal.  Reflexes - The patient's reflexes were 1+ in all extremities and he had no pathological reflexes.  Sensory - Light touch, temperature/pinprick were assessed and were symmetrical.    Coordination - The patient had normal movements in the hands and feet with no ataxia or dysmetria.  Tremor was absent.  Gait and Station - not tested due to safety concerns.   ASSESSMENT/PLAN Mr. DIGBY GROENEVELD is a 60 y.o. male with history of of obesity, CAD and mild mental retardation living in a group home presenting with LOC, seizure-like activity and equivocal loss of pulse, s/p CPR. MRI revealed bilateral anterior punctate embolic infarcts. He did not receive IV t-PA due to stroke not recognized on admission.   Convulsive syncope, seizure ruled out  Found down with seizure-like activity  Initially started on Keppra  EEG diffuse slowing, no seizure  seizure ruled out  Keppra stopped  2D echo unremarkable but EF 45-50%  No arrhythmia on tele so far  Work up per primary team  Stroke:  Multiple punctate bilateral anterior circulation infarcts, embolic pattern secondary to presumed hypotension in setting of syncope and diffuse small vessel disease source  No Resultant  No residue neuro deficits  MRI  Multiple small bilateral anterior circulation infarcts  CTA head and neck  Extensive atherosclerosis b/l cavernous ICAs, R ICA 50%, L ICA 40%, B VA 50% origins, distal vertebral 80% stenosis, BA lumen measures 1mm or less  2D Echo  EF 45-50%, no source of embolus  LDL 174  HgbA1c 13.7  Lovenox 40 mg sq daily for VTE prophylaxis DIET DYS 2 Room service appropriate?: Yes; Fluid consistency:: Thin  aspirin 81 mg  daily and clopidogrel 75 mg daily prior to admission, now on clopidogrel 75 mg daily. Would resume aspirin in addition to plavix.  Patient counseled to be compliant with his antithrombotic medications  Outpt 30 day monitor to look for possible atrial fibrillation as stroke etiology  Ongoing aggressive stroke risk factor management  Therapy recommendations:  SNF  Disposition:  pending  (from Group Home)  Hypertension  As low as 90s yesterday, up to 120s today  Orthostatic vital signs - negative for orthostatic hypotension  SBP goal 130-150 due to intracranial stenosis  Hyperlipidemia  Home meds:  lipitor 40, resumed in hospital  LDL 174, goal < 70  Educated on medication compliance  Continue statin at discharge  Diabetes type II, uncontrolled DKA  HgbA1c 13.7, goal < 7.0  On lantus  On SSI  Diabetic diet   Other Stroke Risk Factors  Overweight, Body mass index is 27.49 kg/(m^2).   Coronary artery disease  Other Active Problems  Baseline mild mental retardation  Elevated troponin, felt to be due to CPR performance and acute stroke, trending down, last one 0.21  AKI  Baseline dysphagai  Hospital day # 3  Neurology will sign off. Please call with questions. Pt will follow up with carolyn Daphine Deutscher NP at [facility] in about 2 months. Thanks for the consult.  Marvel Plan, MD PhD Stroke Neurology 03/12/2016 11:13 PM     To contact Stroke Continuity provider, please refer to [email or web address]. After hours, contact General Neurology

## 2016-03-12 NOTE — Progress Notes (Signed)
Speech Language Pathology Treatment: Dysphagia  Patient Details Name: Larry Riggs MRN: [id number] DOB: January 28, 1956 Today's Date: 03/12/2016 Time: 4859-2763 SLP Time Calculation (min) (ACUTE ONLY): 10 min  Assessment / Plan / Recommendation Clinical Impression  F/u for swallowing.  Pt with resolved dysphagia - alert, smiling, interactive.  Consumed regular solids and thin liquids with sufficient mastication, brisk swallow response, and no s/s of aspiration, even when taxed with large, successive boluses.  Recommend resuming a regular diet (carb modified), meds whole in liquid, no SLP f/u needed.    HPI HPI: 60 y.o. male with medical history significant of obesity, CAD (3 vessel disease but no CABG), GERD, esophageal dysphagia (barium swallow 12/2013: spasm of the upper-mid thoracic esophagus which temporarily restricted the passage of the barium tablet; Mild gastroesophageal reflux; Small hiatal hernia), and intellectual disability living in a group home admitted with fever, possible seizure.       SLP Plan  All goals met     Recommendations  Diet recommendations: Regular;Thin liquid Liquids provided via: Straw;Cup Medication Administration: Whole meds with liquid Supervision: Patient able to self feed             Oral Care Recommendations: Oral care BID Follow up Recommendations: None Plan: All goals met     GO                Juan Quam Laurice 03/12/2016, 10:51 AM

## 2016-03-12 NOTE — Progress Notes (Signed)
Report called to RN at unit 67M

## 2016-03-12 NOTE — Progress Notes (Signed)
Pt arrived to room 5M7 from [facility].  Pt alert and oriented.  Denies any pain.  VSS.  Telemetry placed and CCMD called.  Pt verbalizes understanding to call before attempting to get OOB.  Call bell and phone within reach and bed alarm set.  Will continue to monitor.

## 2016-03-12 NOTE — Progress Notes (Signed)
Inpatient Diabetes Program Recommendations  AACE/ADA: New Consensus Statement on Inpatient Glycemic Control (2015)  Target Ranges:  Prepandial:   less than 140 mg/dL      Peak postprandial:   less than 180 mg/dL (1-2 hours)      Critically ill patients:  140 - 180 mg/dL  Results for Larry Riggs, Larry Riggs (MRN [id number]) as of 03/12/2016 08:43  Ref. Range 03/11/2016 08:38 03/11/2016 10:38 03/11/2016 13:11 03/11/2016 16:24 03/11/2016 21:18 03/12/2016 07:47  Glucose-Capillary Latest Ref Range: 65-99 mg/dL 962310 (Riggs) 952270 (Riggs) 841238 (Riggs) 225 (Riggs) 211 (Riggs) 204 (Riggs)  Results for Larry Riggs, Larry Riggs (MRN [id number]) as of 03/12/2016 08:43  Ref. Range 03/10/2016 12:41  Hemoglobin A1C Latest Ref Range: 4.8-5.6 % 13.7 (Riggs)   Review of Glycemic Control  Diabetes history: DM2 Outpatient Diabetes medications: Metformin 1000 mg bid, Glipizide 5 mg bid, Jnuvia 100 mg daily Current orders for Inpatient glycemic control: Lantus 22 units daily, Novolog 0-15 units TID with meals, Novolog 0-5 units QHS, Novolog 3 units TID with meals for meal coverage  Inpatient Diabetes Program Recommendations: Insulin - Basal: Patient received a total of Lantus 22 units on 03/11/16 and fasting glucose is 204 mg/dl this morning. Please consider increasing Lantus to 25 units daily.  Insulin - Meal Coverage: Please consider increasing meal coverage to Novolog 5 units TID with meals. A1C: A1C 13.7% on 03/10/16 indicating very poor glycemic control over the past 2-3 months. MD, please indicate in note if patient will be discharged on insulin. If so, please inform bedside nursing to begin educating patient on insulin administration and to ensure that group home staff is also educated on insulin administration.  Thanks, Orlando PennerMarie Brya Simerly, RN, MSN, CDE Diabetes Coordinator Inpatient Diabetes Program [phone] (Team Pager from 8am to 5pm) [phone] ([facility] office) [phone] [facility] office) [phone] [facility] office)

## 2016-03-12 NOTE — Consult Note (Signed)
CARDIOLOGY CONSULT NOTE   Patient ID: Larry Riggs MRN: [id number] DOB/AGE: 1956-04-05 60 y.o.  Admit date: 03/09/2016  Primary Physician   Casilda Carls, MD Primary Cardiologist: Dr. Fletcher Anon  Reason for Consultation: Elevated troponin   HPI: Larry Riggs is a 60 year old male with a past medical history of CAD PCI to LAD in 2015, HLD, HTN, DM, and GERD.    He is a resident of a group home in [place], he had mild mental retardation.  He was in the bathroom vomiting and a group home worker heard a loud crash on the floor.  Patient was found in the bathroom having what looked like a seizure, CPR was started as the workers said they could not get a pulse. When EMS arrived, patient had a pulse and was combative.   Head CT revealed no acute infarct or hemorrhage. MRI showed multiple thromboembolic appearing strokes. Neurology is following.   His troponin was elevated at 0.22, and has been flat. His EKG revealed sinus tachycardia with nonspecific ST changes. He has a history of CAD, and was once evaluated for CABG at [facility], but it was felt that there were no good targets for the LAD (in 2014).  His last cath in 2015 showed diffuse disease in LAD and 90% proximal stenosis in left circumflex, but that lesion was small in size. PCI was placed to first diagonal.   His echo showed EF of 45-50%, and there was dyskinesis of the apical myocardium.  He also had grade 1 DD.   He has DM, last A1c was 13.7. Group home worker reports non compliance with diet. LDL is 174. He makes his own medical decisions, but it is unclear if he is fully compliant with his medications.    Past Medical History  Diagnosis Date  . Obesity   . Mildly mentally retarded   . Coronary atherosclerosis of native coronary vessel     a. 08/2013 Cath: signif 3V dzs w EF of 55%, eval @ [facility] for CABG but felt that there was no good target in the LAD;  b. 12/2013 Cath/PCI: LM nl, LAD 37m(PTCA), 95d (attempted PTCA), subtl distal,  D1 95 (2.75x16 Promus DES), D2 90ost, D3 80-90ost, D4 60ost, LCX 90p, small, RCA 233mPDA nl, RPL1 60, RPL2 90ost.  . Hyperlipidemia   . Hypertension   . Asthma   . Type II diabetes mellitus ([facility]  . GERD (gastroesophageal reflux disease)   . Dysphagia      Past Surgical History  Procedure Laterality Date  . Inguinal hernia repair Right 1990's  . Cardiac catheterization  08/2013    [facility]  . Coronary angioplasty with stent placement  12/27/2013    "1"  . Percutaneous coronary stent intervention (pci-s) N/A 12/27/2013    Procedure: PERCUTANEOUS CORONARY STENT INTERVENTION (PCI-S);  Surgeon: MuWellington HampshireMD;  [facility];  Service: Cardiovascular;  Laterality: N/A;    No Known Allergies  I have reviewed the patient's current medications . amLODipine  2.5 mg Oral Daily  . atorvastatin  40 mg Oral Daily  . clopidogrel  75 mg Oral Daily  . DULoxetine  120 mg Oral Daily  . enoxaparin (LOVENOX) injection  40 mg Subcutaneous Q24H  . insulin aspart  0-15 Units Subcutaneous TID WC  . insulin aspart  0-5 Units Subcutaneous QHS  . insulin aspart  3 Units Subcutaneous TID WC  . [START ON 03/13/2016] insulin glargine  27 Units Subcutaneous Daily  .  insulin starter kit- pen needles  1 kit Other Once  . lisinopril  40 mg Oral Daily  . metoprolol  50 mg Oral BID  . pantoprazole  40 mg Oral Daily  . potassium chloride  10 mEq Oral Daily  . risperidone  4 mg Oral Daily  . sodium chloride flush  3 mL Intravenous Q12H  . traZODone  50 mg Oral QHS  . [START ON 03/15/2016] Vitamin D (Ergocalciferol)  50,000 Units Oral Q7 days     acetaminophen **OR** acetaminophen, albuterol, hydrALAZINE, ondansetron **OR** ondansetron (ZOFRAN) IV, senna-docusate  Prior to Admission medications   Medication Sig Start Date End Date Taking? Authorizing Provider  amLODipine (NORVASC) 2.5 MG tablet Take 1 tablet (2.5 mg total) by mouth daily. 11/19/15  Yes Wellington Hampshire, MD  aspirin 81 MG tablet Take  81 mg by mouth daily.   Yes Historical Provider, MD  atorvastatin (LIPITOR) 40 MG tablet Take 40 mg by mouth daily.   Yes Historical Provider, MD  clopidogrel (PLAVIX) 75 MG tablet TAKE 1 TABLET DAILY 03/06/16  Yes Wellington Hampshire, MD  DULoxetine (CYMBALTA) 60 MG capsule Take 120 mg by mouth daily.    Yes Historical Provider, MD  glipiZIDE (GLUCOTROL XL) 5 MG 24 hr tablet Take 5 mg by mouth 2 (two) times daily.   Yes Historical Provider, MD  hydrochlorothiazide (HYDRODIURIL) 25 MG tablet Take 25 mg by mouth daily.   Yes Historical Provider, MD  lisinopril (PRINIVIL,ZESTRIL) 40 MG tablet Take 40 mg by mouth daily.   Yes Historical Provider, MD  metFORMIN (GLUCOPHAGE) 1000 MG tablet Take 1 tablet (1,000 mg total) by mouth 2 (two) times daily with a meal. 12/28/13  Yes Rogelia Mire, NP  metoprolol (LOPRESSOR) 50 MG tablet Take 50 mg by mouth 2 (two) times daily.   Yes Historical Provider, MD  pantoprazole (PROTONIX) 40 MG tablet Take 40 mg by mouth daily.   Yes Historical Provider, MD  potassium chloride (K-DUR,KLOR-CON) 10 MEQ tablet Take 10 mEq by mouth daily.   Yes Historical Provider, MD  risperidone (RISPERDAL) 4 MG tablet Take 4 mg by mouth daily.   Yes Historical Provider, MD  sitaGLIPtin (JANUVIA) 100 MG tablet Take 100 mg by mouth daily.   Yes Historical Provider, MD  traZODone (DESYREL) 50 MG tablet Take 50 mg by mouth at bedtime.   Yes Historical Provider, MD  Vitamin D, Ergocalciferol, (DRISDOL) 50000 UNITS CAPS capsule Take 50,000 Units by mouth every 7 (seven) days.   Yes Historical Provider, MD  albuterol (PROVENTIL HFA;VENTOLIN HFA) 108 (90 BASE) MCG/ACT inhaler Inhale 1 puff into the lungs 3 (three) times daily as needed.     Historical Provider, MD     Social History   Social History  . Marital Status: Single    Spouse Name: N/A  . Number of Children: N/A  . Years of Education: N/A   Occupational History  . Not on file.   Social History Main Topics  . Smoking  status: Never Smoker   . Smokeless tobacco: Never Used  . Alcohol Use: No  . Drug Use: No  . Sexual Activity: No   Other Topics Concern  . Not on file   Social History Narrative    Family Status  Relation Status Death Age  . Father Deceased   . Mother Deceased    Family History  Problem Relation Age of Onset  . Hypertension Father   . Diabetes Father      ROS:  Full 14 point review of systems complete and found to be negative unless listed above.  Physical Exam: Blood pressure 139/84, pulse 85, temperature 98.3 F (36.8 C), temperature source Oral, resp. rate 12, height '5\' 10"'  (1.778 m), weight 191 lb 9.3 oz (86.9 kg), SpO2 100 %.  General: Well developed, well nourished, male in no acute distress Head: Eyes PERRLA, No xanthomas.   Normocephalic and atraumatic, oropharynx without edema or exudate.  Lungs: CTA Heart: HRRR S1 S2, no rub/gallop, No murmur. pulses are 2+ extrem.   Neck: No carotid bruits. No lymphadenopathy.  No JVD. Abdomen: Bowel sounds present, abdomen soft and non-tender without masses or hernias noted. Msk:  No spine or cva tenderness. No weakness, no joint deformities or effusions. Extremities: No clubbing or cyanosis. No edema.  Neuro: Alert and oriented X 3.  Psych:  Good affect, responds appropriately Skin: No rashes or lesions noted.  Labs:   Lab Results  Component Value Date   WBC 6.8 03/12/2016   HGB 14.7 03/12/2016   HCT 43.9 03/12/2016   MCV 82.8 03/12/2016   PLT 177 03/12/2016    Recent Labs  03/09/16 1418  INR 1.05    Recent Labs Lab 03/09/16 1356  03/12/16 0309  NA 132*  < > 141  K 2.7*  < > 3.7  CL 90*  < > 105  CO2 22  < > 27  BUN 8  < > 11  CREATININE 1.11  < > 0.95  CALCIUM 9.8  < > 9.0  PROT 8.1  --   --   BILITOT 1.1  --   --   ALKPHOS 106  --   --   ALT 24  --   --   AST 28  --   --   GLUCOSE 518*  < > 176*  ALBUMIN 4.1  --   --   < > = values in this interval not displayed. MAGNESIUM  Date Value Ref Range  Status  03/10/2016 2.3 1.7 - 2.4 mg/dL Final    Recent Labs  03/10/16 0530 03/10/16 1241 03/10/16 1855 03/11/16 0057  TROPONINI 0.29* 0.29* 0.24* 0.21*    Recent Labs  03/09/16 1417  TROPIPOC 0.02   No results found for: BNP Lab Results  Component Value Date   CHOL 243* 03/12/2016   HDL 33* 03/12/2016   LDLCALC 174* 03/12/2016   TRIG 181* 03/12/2016   Echo: 03/11/16 Left ventricle: The cavity size was normal. There was mild  concentric hypertrophy. Systolic function was mildly reduced. The  estimated ejection fraction was in the range of 45% to 50%. There  is dyskinesis of the apical myocardium. Doppler parameters are  consistent with abnormal left ventricular relaxation (grade 1  diastolic dysfunction). - Aortic valve: Trileaflet; mildly thickened, mildly calcified  leaflets.   ECG:  NSR, ST changes.   Radiology:  Ct Angio Head W/cm &/or Wo Cm  03/11/2016  CLINICAL DATA:  Fall with trauma to the head 1 week ago. Behavioral changes since then with aggression. EXAM: CT ANGIOGRAPHY HEAD AND NECK TECHNIQUE: Multidetector CT imaging of the head and neck was performed using the standard protocol during bolus administration of intravenous contrast. Multiplanar CT image reconstructions and MIPs were obtained to evaluate the vascular anatomy. Carotid stenosis measurements (when applicable) are obtained utilizing NASCET criteria, using the distal internal carotid diameter as the denominator. CONTRAST:  50 cc Isovue 370 COMPARISON:  MRI 03/10/2016.  CT 03/09/2016. FINDINGS: CT HEAD Chronic small-vessel ischemic changes are again  demonstrated in the brainstem, cerebellum, thalami, basal ganglia and hemispheric white matter. No sign of acute infarction, mass lesion, hemorrhage, hydrocephalus or extra-axial collection. CTA NECK Aortic arch: Mild atherosclerosis of the arch in the origins of the brachiocephalic vessels. No evidence of aneurysm or dissection. Right carotid system:  Common carotid artery shows atherosclerotic plaque but is widely patent to the bifurcation region. There is advanced atherosclerosis at the carotid bifurcation with extensive calcified plaque. Narrowing in the ICA bulb region shows a minimal diameter of 2.5 mm. Compared to a more distal cervical ICA diameter of 5 mm, this indicates a 50% stenosis. Cervical internal carotid artery is widely patent distal to the bulb. Left carotid system: Common carotid artery shows plaque but no stenosis. There is extensive calcified plaque at the carotid bifurcation and proximal ICA. Minimal diameter is 3 mm. Compared to a more distal cervical ICA diameter of 5 mm, this indicates a 40% stenosis. Beyond the bulb, the ICA is widely patent. Vertebral arteries:There is calcified plaque at both vertebral artery origins. The left vertebral artery is dominant. Stenosis at the origin is estimated at 50%. Stenosis at the right vertebral artery is difficult to measure clearly, but is probably also approximately 50%. Beyond that, the vessels are widely patent through the cervical region. Skeleton: Ordinary cervical spondylosis. Other neck: No mass or lymphadenopathy.  Lung apices are clear. CTA HEAD Anterior circulation: Both internal carotid arteries are patent through the siphon region. There is extensive atherosclerotic calcification in the carotid siphon region bilaterally. Stenosis is estimated at 50% on each side. The anterior and middle cerebral vessels are patent without proximal stenosis, aneurysm or vascular malformation. Posterior circulation: Both vertebral arteries are patent with the left being dominant. The right vertebral artery terminates in PICA. The left vertebral artery shows atherosclerotic disease proximal to the basilar with fusiform aneurysmal dilatation to a diameter of 6 mm. The lumen is narrowed to a diameter of 1 mm. This is probably a functional stenosis of 80% of the distal left vertebral artery. The basilar  artery shows multiple foci of stenosis with the limb an measuring 1 mm or less. Vertebral artery terminates in the superior cerebellar arteries, with the posterior cerebral arteries receiving there supply from the anterior circulation. Venous sinuses: Patent and normal Anatomic variants: None significant Delayed phase: No abnormal enhancement IMPRESSION: Extensive calcific atherosclerosis at both carotid bifurcations. 50% stenosis of the proximal ICA on the right and 40% stenosis of the proximal ICA on the left. 50% stenosis at both vertebral artery origins. Atherosclerotic disease in the carotid siphon regions bilaterally with stenosis estimated at 50% in those regions. Right vertebral artery terminates in PICA. Left vertebral artery supplies the basilar. 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 Severe atherosclerotic disease with focal stenosis estimated at 80% at the distal vertebral level. Serial stenoses of the basilar artery with the lumen measuring 1 mm or less. Electronically Signed   By: Nelson Chimes M.D.   On: 03/11/2016 19:30   Ct Angio Neck W/cm &/or Wo/cm  03/11/2016  CLINICAL DATA:  Fall with trauma to the head 1 week ago. Behavioral changes since then with aggression. EXAM: CT ANGIOGRAPHY HEAD AND NECK TECHNIQUE: Multidetector CT imaging of the head and neck was performed using the standard protocol during bolus administration of intravenous contrast. Multiplanar CT image reconstructions and MIPs were obtained to evaluate the vascular anatomy. Carotid stenosis measurements (when applicable) are obtained utilizing NASCET criteria, using the distal  internal carotid diameter as the denominator. CONTRAST:  50 cc Isovue 370 COMPARISON:  MRI 03/10/2016.  CT 03/09/2016. FINDINGS: CT HEAD Chronic small-vessel ischemic changes are again demonstrated in the brainstem, cerebellum, thalami, basal ganglia and hemispheric white matter. No sign of acute  infarction, mass lesion, hemorrhage, hydrocephalus or extra-axial collection. CTA NECK Aortic arch: Mild atherosclerosis of the arch in the origins of the brachiocephalic vessels. No evidence of aneurysm or dissection. Right carotid system: Common carotid artery shows atherosclerotic plaque but is widely patent to the bifurcation region. There is advanced atherosclerosis at the carotid bifurcation with extensive calcified plaque. Narrowing in the ICA bulb region shows a minimal diameter of 2.5 mm. Compared to a more distal cervical ICA diameter of 5 mm, this indicates a 50% stenosis. Cervical internal carotid artery is widely patent distal to the bulb. Left carotid system: Common carotid artery shows plaque but no stenosis. There is extensive calcified plaque at the carotid bifurcation and proximal ICA. Minimal diameter is 3 mm. Compared to a more distal cervical ICA diameter of 5 mm, this indicates a 40% stenosis. Beyond the bulb, the ICA is widely patent. Vertebral arteries:There is calcified plaque at both vertebral artery origins. The left vertebral artery is dominant. Stenosis at the origin is estimated at 50%. Stenosis at the right vertebral artery is difficult to measure clearly, but is probably also approximately 50%. Beyond that, the vessels are widely patent through the cervical region. Skeleton: Ordinary cervical spondylosis. Other neck: No mass or lymphadenopathy.  Lung apices are clear. CTA HEAD Anterior circulation: Both internal carotid arteries are patent through the siphon region. There is extensive atherosclerotic calcification in the carotid siphon region bilaterally. Stenosis is estimated at 50% on each side. The anterior and middle cerebral vessels are patent without proximal stenosis, aneurysm or vascular malformation. Posterior circulation: Both vertebral arteries are patent with the left being dominant. The right vertebral artery terminates in PICA. The left vertebral artery shows  atherosclerotic disease proximal to the basilar with fusiform aneurysmal dilatation to a diameter of 6 mm. The lumen is narrowed to a diameter of 1 mm. This is probably a functional stenosis of 80% of the distal left vertebral artery. The basilar artery shows multiple foci of stenosis with the limb an measuring 1 mm or less. Vertebral artery terminates in the superior cerebellar arteries, with the posterior cerebral arteries receiving there supply from the anterior circulation. Venous sinuses: Patent and normal Anatomic variants: None significant Delayed phase: No abnormal enhancement IMPRESSION: Extensive calcific atherosclerosis at both carotid bifurcations. 50% stenosis of the proximal ICA on the right and 40% stenosis of the proximal ICA on the left. 50% stenosis at both vertebral artery origins. Atherosclerotic disease in the carotid siphon regions bilaterally with stenosis estimated at 50% in those regions. Right vertebral artery terminates in PICA. Left vertebral artery supplies the basilar. 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 Severe atherosclerotic disease with focal stenosis estimated at 80% at the distal vertebral level. Serial stenoses of the basilar artery with the lumen measuring 1 mm or less. Electronically Signed   By: Nelson Chimes M.D.   On: 03/11/2016 19:30   Mr Brain Wo Contrast  03/10/2016  CLINICAL DATA:  Initial evaluation for acute altered mental status, seizure. EXAM: MRI HEAD WITHOUT CONTRAST TECHNIQUE: Multiplanar, multiecho pulse sequences of the brain and surrounding structures were obtained without intravenous contrast. COMPARISON:  Prior CT from 03/09/2016. FINDINGS: Diffuse prominence of the CSF containing  spaces is compatible with generalized cerebral atrophy. Patchy and confluent T2/FLAIR hyperintensity within the periventricular and deep white matter most consistent with chronic small vessel ischemic disease, mild in nature.  Chronic small vessel ischemic disease present within the pons as well. Scattered remote lacunar infarcts within the bilateral basal ganglia thalami. Remote lacunar infarcts within the central pons. There is a remote right cerebellar infarct. There are scattered sub cm multi focal acute ischemic infarcts involving the supratentorial and infratentorial brain. Specifically, 6 mm infarct within the right caudate head. Small 1 cm curvilinear infarct within the posterior right lentiform nucleus. Subcentimeter cortical infarct within the right operculum. Small periventricular ischemic infarct adjacent to the right lateral ventricle. Few small right temporal lobe infarcts noted. Few small periatrial white matter infarcts adjacent to the left lateral ventricle. Small subcentimeter cortical infarcts within the left temporal lobe. Additionally, there are a few small subcentimeter infratentorial infarcts involving the cerebellar hemispheres bilaterally. These are likely embolic in nature from a central source given the involvement of various vascular distributions. No associated mass effect or hemorrhage. Major intravascular flow voids are maintained. No mass lesion, midline shift, or mass effect. No hydrocephalus. No extra-axial fluid collection. Thin section imaging through the hippocampi demonstrates normal morphology and signal intensity bilaterally. Craniocervical junction within normal limits. Visualized upper cervical spine unremarkable. Pituitary gland within normal limits. No acute abnormality about the orbits. Scattered mucosal thickening throughout the paranasal sinuses, greatest within the maxillary sinuses. No air-fluid levels to suggest active sinus infection. Mastoid air cells are clear. Inner ear structures normal. Bone marrow signal intensity within normal limits. No scalp soft tissue abnormality. IMPRESSION: 1. Multi focal acute subcentimeter ischemic infarcts involving the supratentorial and infratentorial  brain as above. 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 2. Multiple chronic lacunar infarctions involving the bilateral basal ganglia, thalami, and brainstem, with additional remote right cerebellar infarct. 3. Age-related cerebral atrophy with mild chronic small vessel ischemic disease. Electronically Signed   By: Jeannine Boga M.D.   On: 03/10/2016 22:59    ASSESSMENT AND PLAN:    Active Problems:   Hypertension   Type II diabetes mellitus (HCC)   Syncope and collapse   Seizures (HCC)   Hypokalemia   Hyperglycemia   Syncope   CVA (cerebral infarction)  1. Elevated troponin: Patient's troponin is mildly elevated and flat. He did receive CPR, period of time unknown.  He is chest pain free.  He does have a significant history of CAD, including known chronic occlusion of his LAD.  His last cath was in 2015, PCI was attempted to the LAD but could not pass the lesion due to significant calcifications and tortuosity. His circumflex is also 90% occluded with small proximal stenosis. At this time, there is no need for repeat cath, he will need to continue his medical therapy of ASA and Plavix.  His echo shows dyskinesis of the apical myocardium which is consistent with his known chronic occlusion of his LAD. He will need to be increased to high intensity statin.   2. Syncope: Could be vaso-vagal event as this occurred when he was vomiting.  No arrhythmia seen on tele.   3. DM: Elevated A1C at 13.7. Managed by primary team.    4. HTN: BP well controlled on CCB, BB, and ACEI  Signed: Arbutus Leas, NP 03/12/2016 1:50 PM Pager [phone]  Co-Sign MD

## 2016-03-12 NOTE — Plan of Care (Signed)
Problem: Education: Goal: Knowledge of [facility] Education information/materials will improve Outcome: Progressing Patient is mildly mental retarded per diagnosis.  He is able to understand some of the information such as not to get out of bed without calling for help.

## 2016-03-12 NOTE — Progress Notes (Signed)
PROGRESS NOTE    Larry Riggs  UUV:[id number] DOB: 12-09-1955 DOA: 03/09/2016 PCP: Sherrie Mustache, MD   Outpatient Specialists:     Brief Narrative:  Larry Riggs is a 60 y.o. male with medical history significant of obesity, CAD (3 vessel disease but no CABG), and mental retardation living in a group home. Group Home worker reports patient was in the bathroom "throwing up" and they heard a "yell" and then a crash and then found him on the floor. When they arrived, he appeared to be having a seizure. He had cuts to the right side of his face. He was also confused as he "came to". He slowly in the ER became less confused. Dr. Madilyn Hook also reported that Group home workers were unable to find a pulse so they did CPR. When EMS arrived and they stopped CPR, he had a pulse and was combative.   Admitting physician was able to speak with main caregiver on 4/25 who states patient is not complaint with diet- has soda/cookies but his baseline is not confused- patient checks his own sugars and can answer questions appropriately MRI significant for acute CVA   Assessment & Plan:   Active Problems:   Hypertension   Type II diabetes mellitus (HCC)   Syncope and collapse   Seizures (HCC)   Hypokalemia   Hyperglycemia   Syncope   CVA (cerebral infarction)  Acute ischemic CVA - MRI brain significant for Multi focal acute subcentimeter ischemic infarcts involving the supratentorial and infratentorial brain Seizures vs vaso-vagal syncope (reportedly vomiting) vs cardiac arrest - followed by  Neurology, - CTA head and neck significant for Extensive calcific atherosclerosis in multiple vessels in head and neck  - 2-D echo significant For EF 45-50%, with dyskinesis and apical myocardium, and diastolic dysfunction - LDL is 474, already on Lipitor - glycohemoglobin A1c is 13.7 - Patient is already on statin and Plavix - Further management by neurology.  Seizures vs vaso-vagal syncope  (reportedly vomiting)  - Loss of consciousness most likely related to acute CVA more than actual seizures - Neurology consult appreciated, currently off Keppra - continue telemetry  Elevated troponin - Likely on the setting of CPR performed at facility, as well secondary to CVA, upon is trending down 0.29> 0.24> 0.21, 2-D echo significant for apical wall dyskinesis, will consult cardiology for further evaluation.  HTN- urgency -? Accuracy of reading as arms are stiffened and bent - Acceptable on home medication  Dysphagia -DYS 2 diet  Hypokalemia - Repleted,    DKA/uncontrolled type 2 diabetes mellitus -gap > 15, initially on insulin drip - HbA1C is 13.7 -CBG remained uncontrolled, will increase Lantus from 22-27. -  will need Lantus on discharge  Fever - patient  with temperature of 101 on 4/24, afebrile since, unclear etiology, chest x-ray with no acute findings, blood culture with no growth to date, no signs of meningitis, urinalysis still pending.  AKI - resolvedwith  with IV fluids    DVT prophylaxis:  lovenox  Code Status: Full Code   Family Communication: None at bedside.  Disposition Plan:  Back to group home when stable.   Consultants:   neuro  Procedures:     Antimicrobials:      Subjective: Patient denies any complaints, no chest pain, no shortness of breath, no focal deficits  Objective: Filed Vitals:   03/12/16 0000 03/12/16 0400 03/12/16 0455 03/12/16 0748  BP: 126/83 126/81  140/80  Pulse:      Temp: 98.1 F (36.7  C) 98.1 F (36.7 C)  98.3 F (36.8 C)  TempSrc: Oral Oral  Oral  Resp: Height:      Weight:   86.9 kg (191 lb 9.3 oz)   SpO2:        Intake/Output Summary (Last 24 hours) at 03/12/16 1020 Last data filed at 03/11/16 2341  Gross per 24 hour  Intake    360 ml  Output    900 ml  Net   -540 ml   Filed Weights   03/10/16 0358 03/11/16 0500 03/12/16 0455  Weight: 81.6 kg (179 lb 14.3 oz) 87.9 kg (193  lb 12.6 oz) 86.9 kg (191 lb 9.3 oz)    Examination:  General exam: Awake, alert, pleasant Respiratory system: Clear to auscultation. Respiratory effort normal. Cardiovascular system: S1 & S2 heard, RRR. No JVD, murmurs, rubs, gallops or clicks. No pedal edema. Gastrointestinal system: Abdomen is nondistended, soft and nontender. No organomegaly or masses felt. Normal bowel sounds heard. Skin: excoriations on b/l legs     Data Reviewed: I have personally reviewed following labs and imaging studies  CBC:  Recent Labs Lab 03/09/16 1356 03/11/16 0057 03/12/16 0309  WBC 11.8* 10.9* 6.8  NEUTROABS 10.1*  --   --   HGB 18.2* 14.7 14.7  HCT 49.2 42.6 43.9  MCV 78.6 81.3 82.8  PLT 217 184 177   Basic Metabolic Panel:  Recent Labs Lab 03/09/16 2322 03/10/16 0219 03/10/16 0530 03/10/16 0832 03/11/16 0057 03/12/16 0309  NA 135 133* 135  --  136 141  K 2.9* 3.0* 3.0*  --  3.1* 3.7  CL 96* 95* 100*  --  103 105  CO2 20* 21* 23  --  24 27  GLUCOSE 503* 480* 355*  --  204* 176*  BUN --  15 11  CREATININE 1.36* 1.54* 1.44*  --  1.25* 0.95  CALCIUM 9.1 9.1 8.8*  --  9.4 9.0  MG  --   --   --  2.3  --   --    GFR: Estimated Creatinine Clearance: 85.4 mL/min (by C-G formula based on Cr of 0.95). Liver Function Tests:  Recent Labs Lab 03/09/16 1356  AST 28  ALT 24  ALKPHOS 106  BILITOT 1.1  PROT 8.1  ALBUMIN 4.1   No results for input(s): LIPASE, AMYLASE in the last 168 hours.  Recent Labs Lab 03/11/16 0057  AMMONIA 47*   Coagulation Profile:  Recent Labs Lab 03/09/16 1418  INR 1.05   Cardiac Enzymes:  Recent Labs Lab 03/09/16 2322 03/10/16 0530 03/10/16 1241 03/10/16 1855 03/11/16 0057  TROPONINI 0.22* 0.29* 0.29* 0.24* 0.21*   BNP (last 3 results) No results for input(s): PROBNP in the last 8760 hours. HbA1C:  Recent Labs  03/10/16 1241  HGBA1C 13.7*   CBG:  Recent Labs Lab 03/11/16 1038 03/11/16 1311 03/11/16 1624  03/11/16 2118 03/12/16 0747  GLUCAP 270* 238* 225* 211* 204*   Lipid Profile:  Recent Labs  03/12/16 0309  CHOL 243*  HDL 33*  LDLCALC 174*  TRIG 181*  CHOLHDL 7.4   Thyroid Function Tests: No results for input(s): TSH, T4TOTAL, FREET4, T3FREE, THYROIDAB in the last 72 hours. Anemia Panel: No results for input(s): VITAMINB12, FOLATE, FERRITIN, TIBC, IRON, RETICCTPCT in the last 72 hours. Urine analysis:    Component Value Date/Time   COLORURINE YELLOW 03/11/2016 2344   APPEARANCEUR CLEAR 03/11/2016 2344   LABSPEC 1.035* 03/11/2016 2344  PHURINE 6.0 03/11/2016 2344   GLUCOSEU >1000* 03/11/2016 2344   HGBUR NEGATIVE 03/11/2016 2344   BILIRUBINUR NEGATIVE 03/11/2016 2344   KETONESUR NEGATIVE 03/11/2016 2344   PROTEINUR NEGATIVE 03/11/2016 2344   NITRITE NEGATIVE 03/11/2016 2344   LEUKOCYTESUR NEGATIVE 03/11/2016 2344    Recent Results (from the past 240 hour(s))  MRSA PCR Screening     Status: None   Collection Time: 03/09/16  6:21 PM  Result Value Ref Range Status   MRSA by PCR NEGATIVE NEGATIVE Final    Comment:        The GeneXpert MRSA Assay (FDA approved for NASAL specimens only), is one component of a comprehensive MRSA colonization surveillance program. It is not intended to diagnose MRSA infection nor to guide or monitor treatment for MRSA infections.   Culture, blood (Routine X 2) w Reflex to ID Panel     Status: None (Preliminary result)   Collection Time: 03/09/16  6:36 PM  Result Value Ref Range Status   Specimen Description BLOOD RIGHT HAND  Final   Special Requests BOTTLES DRAWN AEROBIC AND ANAEROBIC 10CC  Final   Culture NO GROWTH 2 DAYS  Final   Report Status PENDING  Incomplete  Culture, blood (Routine X 2) w Reflex to ID Panel     Status: None (Preliminary result)   Collection Time: 03/09/16  6:37 PM  Result Value Ref Range Status   Specimen Description BLOOD LEFT ANTECUBITAL  Final   Special Requests BOTTLES DRAWN AEROBIC ONLY 5CC  Final    Culture NO GROWTH 2 DAYS  Final   Report Status PENDING  Incomplete      Anti-infectives    None       Radiology Studies: Ct Angio Head W/cm &/or Wo Cm  03/11/2016  CLINICAL DATA:  Fall with trauma to the head 1 week ago. Behavioral changes since then with aggression. EXAM: CT ANGIOGRAPHY HEAD AND NECK TECHNIQUE: Multidetector CT imaging of the head and neck was performed using the standard protocol during bolus administration of intravenous contrast. Multiplanar CT image reconstructions and MIPs were obtained to evaluate the vascular anatomy. Carotid stenosis measurements (when applicable) are obtained utilizing NASCET criteria, using the distal internal carotid diameter as the denominator. CONTRAST:  50 cc Isovue 370 COMPARISON:  MRI 03/10/2016.  CT 03/09/2016. FINDINGS: CT HEAD Chronic small-vessel ischemic changes are again demonstrated in the brainstem, cerebellum, thalami, basal ganglia and hemispheric white matter. No sign of acute infarction, mass lesion, hemorrhage, hydrocephalus or extra-axial collection. CTA NECK Aortic arch: Mild atherosclerosis of the arch in the origins of the brachiocephalic vessels. No evidence of aneurysm or dissection. Right carotid system: Common carotid artery shows atherosclerotic plaque but is widely patent to the bifurcation region. There is advanced atherosclerosis at the carotid bifurcation with extensive calcified plaque. Narrowing in the ICA bulb region shows a minimal diameter of 2.5 mm. Compared to a more distal cervical ICA diameter of 5 mm, this indicates a 50% stenosis. Cervical internal carotid artery is widely patent distal to the bulb. Left carotid system: Common carotid artery shows plaque but no stenosis. There is extensive calcified plaque at the carotid bifurcation and proximal ICA. Minimal diameter is 3 mm. Compared to a more distal cervical ICA diameter of 5 mm, this indicates a 40% stenosis. Beyond the bulb, the ICA is widely patent.  Vertebral arteries:There is calcified plaque at both vertebral artery origins. The left vertebral artery is dominant. Stenosis at the origin is estimated at 50%. Stenosis at the right  vertebral artery is difficult to measure clearly, but is probably also approximately 50%. Beyond that, the vessels are widely patent through the cervical region. Skeleton: Ordinary cervical spondylosis. Other neck: No mass or lymphadenopathy.  Lung apices are clear. CTA HEAD Anterior circulation: Both internal carotid arteries are patent through the siphon region. There is extensive atherosclerotic calcification in the carotid siphon region bilaterally. Stenosis is estimated at 50% on each side. The anterior and middle cerebral vessels are patent without proximal stenosis, aneurysm or vascular malformation. Posterior circulation: Both vertebral arteries are patent with the left being dominant. The right vertebral artery terminates in PICA. The left vertebral artery shows atherosclerotic disease proximal to the basilar with fusiform aneurysmal dilatation to a diameter of 6 mm. The lumen is narrowed to a diameter of 1 mm. This is probably a functional stenosis of 80% of the distal left vertebral artery. The basilar artery shows multiple foci of stenosis with the limb an measuring 1 mm or less. Vertebral artery terminates in the superior cerebellar arteries, with the posterior cerebral arteries receiving there supply from the anterior circulation. Venous sinuses: Patent and normal Anatomic variants: None significant Delayed phase: No abnormal enhancement IMPRESSION: Extensive calcific atherosclerosis at both carotid bifurcations. 50% stenosis of the proximal ICA on the right and 40% stenosis of the proximal ICA on the left. 50% stenosis at both vertebral artery origins. Atherosclerotic disease in the carotid siphon regions bilaterally with stenosis estimated at 50% in those regions. Right vertebral artery terminates in PICA. Left  vertebral artery supplies the basilar. 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 Severe atherosclerotic disease with focal stenosis estimated at 80% at the distal vertebral level. Serial stenoses of the basilar artery with the lumen measuring 1 mm or less. Electronically Signed   By: Paulina Fusi M.D.   On: 03/11/2016 19:30   Ct Angio Neck W/cm &/or Wo/cm  03/11/2016  CLINICAL DATA:  Fall with trauma to the head 1 week ago. Behavioral changes since then with aggression. EXAM: CT ANGIOGRAPHY HEAD AND NECK TECHNIQUE: Multidetector CT imaging of the head and neck was performed using the standard protocol during bolus administration of intravenous contrast. Multiplanar CT image reconstructions and MIPs were obtained to evaluate the vascular anatomy. Carotid stenosis measurements (when applicable) are obtained utilizing NASCET criteria, using the distal internal carotid diameter as the denominator. CONTRAST:  50 cc Isovue 370 COMPARISON:  MRI 03/10/2016.  CT 03/09/2016. FINDINGS: CT HEAD Chronic small-vessel ischemic changes are again demonstrated in the brainstem, cerebellum, thalami, basal ganglia and hemispheric white matter. No sign of acute infarction, mass lesion, hemorrhage, hydrocephalus or extra-axial collection. CTA NECK Aortic arch: Mild atherosclerosis of the arch in the origins of the brachiocephalic vessels. No evidence of aneurysm or dissection. Right carotid system: Common carotid artery shows atherosclerotic plaque but is widely patent to the bifurcation region. There is advanced atherosclerosis at the carotid bifurcation with extensive calcified plaque. Narrowing in the ICA bulb region shows a minimal diameter of 2.5 mm. Compared to a more distal cervical ICA diameter of 5 mm, this indicates a 50% stenosis. Cervical internal carotid artery is widely patent distal to the bulb. Left carotid system: Common carotid artery shows plaque but no stenosis.  There is extensive calcified plaque at the carotid bifurcation and proximal ICA. Minimal diameter is 3 mm. Compared to a more distal cervical ICA diameter of 5 mm, this indicates a 40% stenosis. Beyond the bulb, the ICA is widely patent.  Vertebral arteries:There is calcified plaque at both vertebral artery origins. The left vertebral artery is dominant. Stenosis at the origin is estimated at 50%. Stenosis at the right vertebral artery is difficult to measure clearly, but is probably also approximately 50%. Beyond that, the vessels are widely patent through the cervical region. Skeleton: Ordinary cervical spondylosis. Other neck: No mass or lymphadenopathy.  Lung apices are clear. CTA HEAD Anterior circulation: Both internal carotid arteries are patent through the siphon region. There is extensive atherosclerotic calcification in the carotid siphon region bilaterally. Stenosis is estimated at 50% on each side. The anterior and middle cerebral vessels are patent without proximal stenosis, aneurysm or vascular malformation. Posterior circulation: Both vertebral arteries are patent with the left being dominant. The right vertebral artery terminates in PICA. The left vertebral artery shows atherosclerotic disease proximal to the basilar with fusiform aneurysmal dilatation to a diameter of 6 mm. The lumen is narrowed to a diameter of 1 mm. This is probably a functional stenosis of 80% of the distal left vertebral artery. The basilar artery shows multiple foci of stenosis with the limb an measuring 1 mm or less. Vertebral artery terminates in the superior cerebellar arteries, with the posterior cerebral arteries receiving there supply from the anterior circulation. Venous sinuses: Patent and normal Anatomic variants: None significant Delayed phase: No abnormal enhancement IMPRESSION: Extensive calcific atherosclerosis at both carotid bifurcations. 50% stenosis of the proximal ICA on the right and 40% stenosis of the  proximal ICA on the left. 50% stenosis at both vertebral artery origins. Atherosclerotic disease in the carotid siphon regions bilaterally with stenosis estimated at 50% in those regions. Right vertebral artery terminates in PICA. Left vertebral artery supplies the basilar. 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 Severe atherosclerotic disease with focal stenosis estimated at 80% at the distal vertebral level. Serial stenoses of the basilar artery with the lumen measuring 1 mm or less. Electronically Signed   By: Paulina Fusi M.D.   On: 03/11/2016 19:30   Mr Brain Wo Contrast  03/10/2016  CLINICAL DATA:  Initial evaluation for acute altered mental status, seizure. EXAM: MRI HEAD WITHOUT CONTRAST TECHNIQUE: Multiplanar, multiecho pulse sequences of the brain and surrounding structures were obtained without intravenous contrast. COMPARISON:  Prior CT from 03/09/2016. FINDINGS: Diffuse prominence of the CSF containing spaces is compatible with generalized cerebral atrophy. Patchy and confluent T2/FLAIR hyperintensity within the periventricular and deep white matter most consistent with chronic small vessel ischemic disease, mild in nature. Chronic small vessel ischemic disease present within the pons as well. Scattered remote lacunar infarcts within the bilateral basal ganglia thalami. Remote lacunar infarcts within the central pons. There is a remote right cerebellar infarct. There are scattered sub cm multi focal acute ischemic infarcts involving the supratentorial and infratentorial brain. Specifically, 6 mm infarct within the right caudate head. Small 1 cm curvilinear infarct within the posterior right lentiform nucleus. Subcentimeter cortical infarct within the right operculum. Small periventricular ischemic infarct adjacent to the right lateral ventricle. Few small right temporal lobe infarcts noted. Few small periatrial white matter infarcts adjacent to the  left lateral ventricle. Small subcentimeter cortical infarcts within the left temporal lobe. Additionally, there are a few small subcentimeter infratentorial infarcts involving the cerebellar hemispheres bilaterally. These are likely embolic in nature from a central source given the involvement of various vascular distributions. No associated mass effect or hemorrhage. Major intravascular flow voids are maintained. No mass lesion, midline shift, or  mass effect. No hydrocephalus. No extra-axial fluid collection. Thin section imaging through the hippocampi demonstrates normal morphology and signal intensity bilaterally. Craniocervical junction within normal limits. Visualized upper cervical spine unremarkable. Pituitary gland within normal limits. No acute abnormality about the orbits. Scattered mucosal thickening throughout the paranasal sinuses, greatest within the maxillary sinuses. No air-fluid levels to suggest active sinus infection. Mastoid air cells are clear. Inner ear structures normal. Bone marrow signal intensity within normal limits. No scalp soft tissue abnormality. IMPRESSION: 1. Multi focal acute subcentimeter ischemic infarcts involving the supratentorial and infratentorial brain as above. 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 2. Multiple chronic lacunar infarctions involving the bilateral basal ganglia, thalami, and brainstem, with additional remote right cerebellar infarct. 3. Age-related cerebral atrophy with mild chronic small vessel ischemic disease. Electronically Signed   By: Rise Mu M.D.   On: 03/10/2016 22:59        Scheduled Meds: . amLODipine  2.5 mg Oral Daily  . atorvastatin  40 mg Oral Daily  . clopidogrel  75 mg Oral Daily  . DULoxetine  120 mg Oral Daily  . enoxaparin (LOVENOX) injection  40 mg Subcutaneous Q24H  . insulin aspart  0-15 Units Subcutaneous TID WC  . insulin aspart  0-5 Units  Subcutaneous QHS  . insulin aspart  3 Units Subcutaneous TID WC  . insulin glargine  22 Units Subcutaneous Daily  . lisinopril  40 mg Oral Daily  . metoprolol  50 mg Oral BID  . pantoprazole  40 mg Oral Daily  . potassium chloride  10 mEq Oral Daily  . risperidone  4 mg Oral Daily  . sodium chloride flush  3 mL Intravenous Q12H  . traZODone  50 mg Oral QHS  . [START ON 03/15/2016] Vitamin D (Ergocalciferol)  50,000 Units Oral Q7 days   Continuous Infusions: . sodium chloride Stopped (03/10/16 0909)  . dextrose 5 % and 0.45% NaCl 75 mL/hr at 03/10/16 0909     LOS: 3 days    Time spent: 35 min    Laury Huizar, MD [facility] Pager [phone]  If 7PM-7AM, please contact night-coverage [email or web address] Password [email or web address] 03/12/2016, 10:20 AM

## 2016-03-12 NOTE — Progress Notes (Signed)
Physical Therapy Treatment Patient Details Name: Larry Riggs MRN: [id number] DOB: Aug 21, 1956 Today's Date: 03/12/2016    History of Present Illness Larry Riggs is a 60 y.o. male with medical history significant of obesity, CAD (3 vessel disease but no CABG), and mental retardation living in a group home.Group Home worker reports patient was in the bathroom "throwing up" and they heard a "yell" and then a crash and then found him on the floor. When they arrived, he appeared to be having a seizure. He had cuts to the right side of his face. He was also confused as he "came to". He slowly in the ER became less confused. Dr. Madilyn Hook also reported that Group home workers were unable to find a pulse so they did CPR. When EMS arrived and they stopped CPR, he had a pulse and was combative.     PT Comments    Pt admitted with above diagnosis. Pt currently with functional limitations due to balance and endurance deficits.Pt was able to ambulate with RW with mild unsteadiness but much better than last session.  Pt will benefit from SNF to continue therapy to reach prior functional level.    Pt will benefit from skilled PT to increase their independence and safety with mobility to allow discharge to the venue listed below.    Follow Up Recommendations  SNF;Supervision/Assistance - 24 hour     Equipment Recommendations  Rolling walker with 5" wheels;3in1 (PT)    Recommendations for Other Services       Precautions / Restrictions Precautions Precautions: Fall Restrictions Weight Bearing Restrictions: No    Mobility  Bed Mobility Overal bed mobility: Needs Assistance;+2 for physical assistance Bed Mobility: Supine to Sit     Supine to sit: Min guard     General bed mobility comments: Pt was able to sit up without assist but needed min guard assist due to impulsivity.   Transfers Overall transfer level: Needs assistance Equipment used: Rolling walker (2 wheeled) Transfers: Sit  to/from Stand Sit to Stand: Min guard         General transfer comment: Pt with much better balance than last session.  STill with slight posterior lean but much better than prior treatment.  Does rely on RW.  Ambulation/Gait Ambulation/Gait assistance: Min guard Ambulation Distance (Feet): 150 Feet Assistive device: Rolling walker (2 wheeled) Gait Pattern/deviations: Step-through pattern;Decreased stride length;Drifts right/left;Narrow base of support   Gait velocity interpretation: Below normal speed for age/gender General Gait Details: Pt reliant on RW for balance but is much steadier with the RW with gait.  Cannot withstand challenges at this time to balance.     Stairs            Wheelchair Mobility    Modified Rankin (Stroke Patients Only)       Balance Overall balance assessment: Needs assistance;History of Falls Sitting-balance support: No upper extremity supported;Feet supported Sitting balance-Leahy Scale: Good     Standing balance support: Bilateral upper extremity supported;During functional activity Standing balance-Leahy Scale: Poor Standing balance comment: RElies on RW but better balance than last session.  Cannot stand statically without UE support.              High level balance activites: Direction changes;Turns;Sudden stops High Level Balance Comments: min guard assist for balance.     Cognition Arousal/Alertness: Awake/alert Behavior During Therapy: Flat affect;Impulsive;Restless Overall Cognitive Status: History of cognitive impairments - at baseline  Exercises General Exercises - Lower Extremity Long Arc Quad: AROM;Both;10 reps;Seated    General Comments        Pertinent Vitals/Pain Pain Assessment: No/denies pain  VSS     Home Living                      Prior Function            PT Goals (current goals can now be found in the care plan section) Progress towards PT goals:  Progressing toward goals    Frequency  Min 3X/week    PT Plan Current plan remains appropriate    Co-evaluation             End of Session Equipment Utilized During Treatment: Gait belt Activity Tolerance: Patient limited by fatigue Patient left: in chair;with call bell/phone within reach;with chair alarm set     Time: [id number] PT Time Calculation (min) (ACUTE ONLY): 20 min  Charges:  $Gait Training: 8-22 mins                    G Codes:      [name], Alvis LemmingsDawn F 03/12/2016, [name],PT Acute Rehabilitation [phone] (pager)

## 2016-03-12 NOTE — Progress Notes (Addendum)
3:30pm CSW saw new PT eval- discussed with group home and pt brother.  All are agreeable to pt return to group home when stable for DC if pt is still as mobile as he was today.  1:20pm CSW received consult for SNF- CSW discussed pt mobility with group home supervisor- they can not accept pt back at current level of mobility and think short term SNF would be appropriate until pt can walk more independently unless pt shows sufficient improvement in the hospital to return to group home.  CSW left message with brother to discuss  CSW will continue to follow  Merlyn LotJenna Holoman, Southern Idaho Ambulatory Surgery CenterCSWA Clinical Social Worker [phone]

## 2016-03-13 ENCOUNTER — Other Ambulatory Visit: Payer: Self-pay | Admitting: Cardiology

## 2016-03-13 ENCOUNTER — Other Ambulatory Visit (HOSPITAL_COMMUNITY): Payer: Federal, State, Local not specified - PPO

## 2016-03-13 DIAGNOSIS — R55 Syncope and collapse: Secondary | ICD-10-CM

## 2016-03-13 LAB — CBC
HCT: 41.8 % (ref 39.0–52.0)
Hemoglobin: 14.2 g/dL (ref 13.0–17.0)
MCH: 28 pg (ref 26.0–34.0)
MCHC: 34 g/dL (ref 30.0–36.0)
MCV: 82.4 fL (ref 78.0–100.0)
PLATELETS: 177 10*3/uL (ref 150–400)
RBC: 5.07 MIL/uL (ref 4.22–5.81)
RDW: 13.1 % (ref 11.5–15.5)
WBC: 5.9 10*3/uL (ref 4.0–10.5)

## 2016-03-13 LAB — GLUCOSE, CAPILLARY
GLUCOSE-CAPILLARY: 220 mg/dL — AB (ref 65–99)
Glucose-Capillary: 151 mg/dL — ABNORMAL HIGH (ref 65–99)
Glucose-Capillary: 158 mg/dL — ABNORMAL HIGH (ref 65–99)

## 2016-03-13 LAB — BASIC METABOLIC PANEL
ANION GAP: 8 (ref 5–15)
BUN: 8 mg/dL (ref 6–20)
CALCIUM: 8.7 mg/dL — AB (ref 8.9–10.3)
CO2: 28 mmol/L (ref 22–32)
Chloride: 102 mmol/L (ref 101–111)
Creatinine, Ser: 0.87 mg/dL (ref 0.61–1.24)
Glucose, Bld: 155 mg/dL — ABNORMAL HIGH (ref 65–99)
POTASSIUM: 3.3 mmol/L — AB (ref 3.5–5.1)
SODIUM: 138 mmol/L (ref 135–145)

## 2016-03-13 MED ORDER — INSULIN GLARGINE 100 UNIT/ML SOLOSTAR PEN: 12.0000 [IU] | PEN_INJECTOR | Freq: Every morning | SUBCUTANEOUS | Status: DC

## 2016-03-13 MED ORDER — INSULIN GLARGINE 100 UNIT/ML SOLOSTAR PEN: 15.0000 [IU] | PEN_INJECTOR | Freq: Every morning | SUBCUTANEOUS | Status: DC

## 2016-03-13 MED ORDER — INSULIN PEN NEEDLE 29G X 12.7MM MISC: Status: DC

## 2016-03-13 MED ORDER — ATORVASTATIN CALCIUM 80 MG PO TABS: 80.0000 mg | ORAL_TABLET | Freq: Every day | ORAL | Status: DC

## 2016-03-13 NOTE — Progress Notes (Signed)
Patient Name: Larry Riggs Date of Encounter: 03/13/2016  Active Problems:   Coronary atherosclerosis of native coronary vessel   Hypertension   Type II diabetes mellitus (HCC)   Syncope and collapse   Seizures (HCC)   Hypokalemia   Hyperglycemia   Syncope   CVA (cerebral infarction)   Elevated troponin   Primary Cardiologist: Dr. Kirke CorinArida Patient Profile:Larry Riggs is a 60 year old male with a past medical history of CAD PCI to LAD in 2015, HLD, HTN, DM, and GERD.He was found in the bathroom of the group home that he lives in with seizure like activity.  Brief CPR, patient was responsive on EMS arrival.  MRI of head shows embolic CVA.  He has known diffuse disease in his LAD.    SUBJECTIVE: Denies CP, SOB.  Feels well.   OBJECTIVE Filed Vitals:   03/13/16 0219 03/13/16 0500 03/13/16 0626 03/13/16 0919  BP: 136/83  130/82 123/69  Pulse: 61  70 83  Temp: 98 F (36.7 C)  98.4 F (36.9 C) 98.1 F (36.7 C)  TempSrc: Oral  Oral Oral  Resp: 18  18 18   Height:      Weight:  191 lb 9.3 oz (86.9 kg)    SpO2: 98%  100% 100%    Intake/Output Summary (Last 24 hours) at 03/13/16 1013 Last data filed at 03/12/16 1034  Gross per 24 hour  Intake      3 ml  Output      0 ml  Net      3 ml   Filed Weights   03/11/16 0500 03/12/16 0455 03/13/16 0500  Weight: 193 lb 12.6 oz (87.9 kg) 191 lb 9.3 oz (86.9 kg) 191 lb 9.3 oz (86.9 kg)    PHYSICAL EXAM General: Well developed, well nourished, male in no acute distress. Head: Normocephalic, atraumatic.  Neck: Supple without bruits, No JVD. Lungs:  Resp regular and unlabored, CTA. Heart: RRR, S1, S2, no S3, S4, or murmur; no rub. Abdomen: Soft, non-tender, non-distended, BS + x 4.  Extremities: No clubbing, cyanosis,No edema.  Neuro: Alert and oriented X 3. Moves all extremities spontaneously. Psych: Normal affect.  LABS: CBC: Recent Labs  03/12/16 0309 03/13/16 0217  WBC 6.8 5.9  HGB 14.7 14.2  HCT 43.9 41.8  MCV  82.8 82.4  PLT 177 177   Basic Metabolic Panel: Recent Labs  03/12/16 0309 03/13/16 0217  NA 141 138  K 3.7 3.3*  CL 105 102  CO2 27 28  GLUCOSE 176* 155*  BUN 11 8  CREATININE 0.95 0.87  CALCIUM 9.0 8.7*   Cardiac Enzymes: Recent Labs  03/10/16 1241 03/10/16 1855 03/11/16 0057  TROPONINI 0.29* 0.24* 0.21*  Hemoglobin A1C: Recent Labs  03/10/16 1241  HGBA1C 13.7*   Fasting Lipid Panel: Recent Labs  03/12/16 0309  CHOL 243*  HDL 33*  LDLCALC 174*  TRIG 181*  CHOLHDL 7.4     Current facility-administered medications:  .  acetaminophen (TYLENOL) tablet 650 mg, 650 mg, Oral, Q6H PRN, 650 mg at 03/09/16 1848 **OR** acetaminophen (TYLENOL) suppository 650 mg, 650 mg, Rectal, Q6H PRN, Selinda OrionJessica U Vann, DO, 650 mg at 03/10/16 0145 .  albuterol (PROVENTIL) (2.5 MG/3ML) 0.083% nebulizer solution 2.5 mg, 2.5 mg, Nebulization, Q6H PRN, Belinda Fisheraylor P Stone, RPH .  amLODipine (NORVASC) tablet 2.5 mg, 2.5 mg, Oral, Daily, Jessica U Vann, DO, 2.5 mg at 03/13/16 1002 .  aspirin chewable tablet 81 mg, 81 mg, Oral, Daily, Dawood S  Elgergawy, MD, 81 mg at 03/13/16 1002 .  atorvastatin (LIPITOR) tablet 40 mg, 40 mg, Oral, Daily, Jessica U Vann, DO, 40 mg at 03/13/16 1002 .  clopidogrel (PLAVIX) tablet 75 mg, 75 mg, Oral, Daily, Jessica U Vann, DO, 75 mg at 03/13/16 1002 .  DULoxetine (CYMBALTA) DR capsule 120 mg, 120 mg, Oral, Daily, Jessica U Vann, DO, 120 mg at 03/13/16 1002 .  enoxaparin (LOVENOX) injection 40 mg, 40 mg, Subcutaneous, Q24H, Jessica U Vann, DO, 40 mg at 03/12/16 1717 .  hydrALAZINE (APRESOLINE) injection 10 mg, 10 mg, Intravenous, Q6H PRN, Selinda Orion Vann, DO .  insulin aspart (novoLOG) injection 0-15 Units, 0-15 Units, Subcutaneous, TID WC, Joseph Art, DO, 3 Units at 03/13/16 0740 .  insulin aspart (novoLOG) injection 0-5 Units, 0-5 Units, Subcutaneous, QHS, Joseph Art, DO, 2 Units at 03/11/16 2137 .  insulin aspart (novoLOG) injection 3 Units, 3 Units,  Subcutaneous, TID WC, Starleen Arms, MD, 3 Units at 03/13/16 0739 .  insulin glargine (LANTUS) injection 27 Units, 27 Units, Subcutaneous, Daily, Starleen Arms, MD, 27 Units at 03/13/16 1003 .  lisinopril (PRINIVIL,ZESTRIL) tablet 40 mg, 40 mg, Oral, Daily, Jessica U Vann, DO, 40 mg at 03/13/16 1002 .  metoprolol (LOPRESSOR) tablet 50 mg, 50 mg, Oral, BID, Jessica U Vann, DO, 50 mg at 03/13/16 1002 .  ondansetron (ZOFRAN) tablet 4 mg, 4 mg, Oral, Q6H PRN **OR** ondansetron (ZOFRAN) injection 4 mg, 4 mg, Intravenous, Q6H PRN, Selinda Orion Vann, DO .  pantoprazole (PROTONIX) EC tablet 40 mg, 40 mg, Oral, Daily, Jessica U Vann, DO, 40 mg at 03/13/16 1002 .  potassium chloride SA (K-DUR,KLOR-CON) CR tablet 10 mEq, 10 mEq, Oral, Daily, Jessica U Vann, DO, 10 mEq at 03/13/16 1002 .  risperiDONE (RISPERDAL) tablet 4 mg, 4 mg, Oral, Daily, Jessica U Vann, DO, 4 mg at 03/13/16 1002 .  senna-docusate (Senokot-S) tablet 1 tablet, 1 tablet, Oral, QHS PRN, Selinda Orion Vann, DO .  sodium chloride flush (NS) 0.9 % injection 3 mL, 3 mL, Intravenous, Q12H, Jessica U Vann, DO, 3 mL at 03/13/16 1004 .  traZODone (DESYREL) tablet 50 mg, 50 mg, Oral, QHS, Joseph Art, DO, 50 mg at 03/12/16 2149 .  [START ON 03/15/2016] Vitamin D (Ergocalciferol) (DRISDOL) capsule 50,000 Units, 50,000 Units, Oral, Q7 days, Joseph Art, DO     Tele: NSR  EKG: NSR    Radiology/Studies: Ct Angio Head W/cm &/or Wo Cm  03/11/2016  CLINICAL DATA:  Fall with trauma to the head 1 week ago. Behavioral changes since then with aggression. EXAM: CT ANGIOGRAPHY HEAD AND NECK TECHNIQUE: Multidetector CT imaging of the head and neck was performed using the standard protocol during bolus administration of intravenous contrast. Multiplanar CT image reconstructions and MIPs were obtained to evaluate the vascular anatomy. Carotid stenosis measurements (when applicable) are obtained utilizing NASCET criteria, using the distal internal carotid  diameter as the denominator. CONTRAST:  50 cc Isovue 370 COMPARISON:  MRI 03/10/2016.  CT 03/09/2016. FINDINGS: CT HEAD Chronic small-vessel ischemic changes are again demonstrated in the brainstem, cerebellum, thalami, basal ganglia and hemispheric white matter. No sign of acute infarction, mass lesion, hemorrhage, hydrocephalus or extra-axial collection. CTA NECK Aortic arch: Mild atherosclerosis of the arch in the origins of the brachiocephalic vessels. No evidence of aneurysm or dissection. Right carotid system: Common carotid artery shows atherosclerotic plaque but is widely patent to the bifurcation region. There is advanced atherosclerosis at the carotid bifurcation with extensive calcified  plaque. Narrowing in the ICA bulb region shows a minimal diameter of 2.5 mm. Compared to a more distal cervical ICA diameter of 5 mm, this indicates a 50% stenosis. Cervical internal carotid artery is widely patent distal to the bulb. Left carotid system: Common carotid artery shows plaque but no stenosis. There is extensive calcified plaque at the carotid bifurcation and proximal ICA. Minimal diameter is 3 mm. Compared to a more distal cervical ICA diameter of 5 mm, this indicates a 40% stenosis. Beyond the bulb, the ICA is widely patent. Vertebral arteries:There is calcified plaque at both vertebral artery origins. The left vertebral artery is dominant. Stenosis at the origin is estimated at 50%. Stenosis at the right vertebral artery is difficult to measure clearly, but is probably also approximately 50%. Beyond that, the vessels are widely patent through the cervical region. Skeleton: Ordinary cervical spondylosis. Other neck: No mass or lymphadenopathy.  Lung apices are clear. CTA HEAD Anterior circulation: Both internal carotid arteries are patent through the siphon region. There is extensive atherosclerotic calcification in the carotid siphon region bilaterally. Stenosis is estimated at 50% on each side. The  anterior and middle cerebral vessels are patent without proximal stenosis, aneurysm or vascular malformation. Posterior circulation: Both vertebral arteries are patent with the left being dominant. The right vertebral artery terminates in PICA. The left vertebral artery shows atherosclerotic disease proximal to the basilar with fusiform aneurysmal dilatation to a diameter of 6 mm. The lumen is narrowed to a diameter of 1 mm. This is probably a functional stenosis of 80% of the distal left vertebral artery. The basilar artery shows multiple foci of stenosis with the limb an measuring 1 mm or less. Vertebral artery terminates in the superior cerebellar arteries, with the posterior cerebral arteries receiving there supply from the anterior circulation. Venous sinuses: Patent and normal Anatomic variants: None significant Delayed phase: No abnormal enhancement IMPRESSION: Extensive calcific atherosclerosis at both carotid bifurcations. 50% stenosis of the proximal ICA on the right and 40% stenosis of the proximal ICA on the left. 50% stenosis at both vertebral artery origins. Atherosclerotic disease in the carotid siphon regions bilaterally with stenosis estimated at 50% in those regions. Right vertebral artery terminates in PICA. Left vertebral artery supplies the basilar. 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 Severe atherosclerotic disease with focal stenosis estimated at 80% at the distal vertebral level. Serial stenoses of the basilar artery with the lumen measuring 1 mm or less. Electronically Signed   By: Paulina Fusi M.D.   On: 03/11/2016 19:30   Ct Angio Neck W/cm &/or Wo/cm  03/11/2016  CLINICAL DATA:  Fall with trauma to the head 1 week ago. Behavioral changes since then with aggression. EXAM: CT ANGIOGRAPHY HEAD AND NECK TECHNIQUE: Multidetector CT imaging of the head and neck was performed using the standard protocol during bolus administration of  intravenous contrast. Multiplanar CT image reconstructions and MIPs were obtained to evaluate the vascular anatomy. Carotid stenosis measurements (when applicable) are obtained utilizing NASCET criteria, using the distal internal carotid diameter as the denominator. CONTRAST:  50 cc Isovue 370 COMPARISON:  MRI 03/10/2016.  CT 03/09/2016. FINDINGS: CT HEAD Chronic small-vessel ischemic changes are again demonstrated in the brainstem, cerebellum, thalami, basal ganglia and hemispheric white matter. No sign of acute infarction, mass lesion, hemorrhage, hydrocephalus or extra-axial collection. CTA NECK Aortic arch: Mild atherosclerosis of the arch in the origins of the brachiocephalic vessels. No evidence of aneurysm or dissection.  Right carotid system: Common carotid artery shows atherosclerotic plaque but is widely patent to the bifurcation region. There is advanced atherosclerosis at the carotid bifurcation with extensive calcified plaque. Narrowing in the ICA bulb region shows a minimal diameter of 2.5 mm. Compared to a more distal cervical ICA diameter of 5 mm, this indicates a 50% stenosis. Cervical internal carotid artery is widely patent distal to the bulb. Left carotid system: Common carotid artery shows plaque but no stenosis. There is extensive calcified plaque at the carotid bifurcation and proximal ICA. Minimal diameter is 3 mm. Compared to a more distal cervical ICA diameter of 5 mm, this indicates a 40% stenosis. Beyond the bulb, the ICA is widely patent. Vertebral arteries:There is calcified plaque at both vertebral artery origins. The left vertebral artery is dominant. Stenosis at the origin is estimated at 50%. Stenosis at the right vertebral artery is difficult to measure clearly, but is probably also approximately 50%. Beyond that, the vessels are widely patent through the cervical region. Skeleton: Ordinary cervical spondylosis. Other neck: No mass or lymphadenopathy.  Lung apices are clear. CTA  HEAD Anterior circulation: Both internal carotid arteries are patent through the siphon region. There is extensive atherosclerotic calcification in the carotid siphon region bilaterally. Stenosis is estimated at 50% on each side. The anterior and middle cerebral vessels are patent without proximal stenosis, aneurysm or vascular malformation. Posterior circulation: Both vertebral arteries are patent with the left being dominant. The right vertebral artery terminates in PICA. The left vertebral artery shows atherosclerotic disease proximal to the basilar with fusiform aneurysmal dilatation to a diameter of 6 mm. The lumen is narrowed to a diameter of 1 mm. This is probably a functional stenosis of 80% of the distal left vertebral artery. The basilar artery shows multiple foci of stenosis with the limb an measuring 1 mm or less. Vertebral artery terminates in the superior cerebellar arteries, with the posterior cerebral arteries receiving there supply from the anterior circulation. Venous sinuses: Patent and normal Anatomic variants: None significant Delayed phase: No abnormal enhancement IMPRESSION: Extensive calcific atherosclerosis at both carotid bifurcations. 50% stenosis of the proximal ICA on the right and 40% stenosis of the proximal ICA on the left. 50% stenosis at both vertebral artery origins. Atherosclerotic disease in the carotid siphon regions bilaterally with stenosis estimated at 50% in those regions. Right vertebral artery terminates in PICA. Left vertebral artery supplies the basilar. 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 Severe atherosclerotic disease with focal stenosis estimated at 80% at the distal vertebral level. Serial stenoses of the basilar artery with the lumen measuring 1 mm or less. Electronically Signed   By: Paulina Fusi M.D.   On: 03/11/2016 19:30     Current Medications:  . amLODipine  2.5 mg Oral Daily  . aspirin  81 mg Oral  Daily  . atorvastatin  40 mg Oral Daily  . clopidogrel  75 mg Oral Daily  . DULoxetine  120 mg Oral Daily  . enoxaparin (LOVENOX) injection  40 mg Subcutaneous Q24H  . insulin aspart  0-15 Units Subcutaneous TID WC  . insulin aspart  0-5 Units Subcutaneous QHS  . insulin aspart  3 Units Subcutaneous TID WC  . insulin glargine  27 Units Subcutaneous Daily  . lisinopril  40 mg Oral Daily  . metoprolol  50 mg Oral BID  . pantoprazole  40 mg Oral Daily  . potassium chloride  10 mEq Oral Daily  .  risperidone  4 mg Oral Daily  . sodium chloride flush  3 mL Intravenous Q12H  . traZODone  50 mg Oral QHS  . [START ON 03/15/2016] Vitamin D (Ergocalciferol)  50,000 Units Oral Q7 days      ASSESSMENT AND PLAN: Active Problems:   Coronary atherosclerosis of native coronary vessel   Hypertension   Type II diabetes mellitus (HCC)   Syncope and collapse   Seizures (HCC)   Hypokalemia   Hyperglycemia   Syncope   CVA (cerebral infarction)   Elevated troponin  1. Elevated troponin: Patient's troponin is mildly elevated and flat. He did receive CPR, period of time unknown. He is chest pain free. He does have a significant history of CAD, including known chronic occlusion of his LAD. His last cath was in 2015, PCI was attempted to the LAD but could not pass the lesion due to significant calcifications and tortuosity. His circumflex is also 90% occluded with small proximal stenosis. At this time, there is no need for repeat cath, he will need to continue his medical therapy of ASA and Plavix. His echo shows dyskinesis of the apical myocardium which is consistent with his known chronic occlusion of his LAD. He will need to be increased to high intensity statin.   2. Syncope: Could be vaso-vagal event as this occurred when he was vomiting. No arrhythmia seen on tele.  3. DM: Elevated A1C at 13.7. Managed by primary team  4. HTN: BP well controlled on CCB, BB, and ACEI  5. CVA: Neurology is  following.   Signed, Little Ishikawa , NP 10:13 AM 03/13/2016 Pager [phone]

## 2016-03-13 NOTE — Discharge Instructions (Signed)
Follow with Primary MD Sherrie MustacheFayegh Jadali, MD in 7 days   Get CBC, CMP, checked  by Primary MD next visit.    Activity: As tolerated with Full fall precautions use walker/cane & assistance as needed   Disposition Group Home   Diet: Heart Healthy , carbohydrate modified , with feeding assistance and aspiration precautions.  For Heart failure patients - Check your Weight same time everyday, if you gain over 2 pounds, or you develop in leg swelling, experience more shortness of breath or chest pain, call your Primary MD immediately. Follow Cardiac Low Salt Diet and 1.5 lit/day fluid restriction.   On your next visit with your primary care physician please Get Medicines reviewed and adjusted.   Please request your Prim.MD to go over all Hospital Tests and Procedure/Radiological results at the follow up, please get all Hospital records sent to your Prim MD by signing hospital release before you go home.   If you experience worsening of your admission symptoms, develop shortness of breath, life threatening emergency, suicidal or homicidal thoughts you must seek medical attention immediately by calling 911 or calling your MD immediately  if symptoms less severe.  You Must read complete instructions/literature along with all the possible adverse reactions/side effects for all the Medicines you take and that have been prescribed to you. Take any new Medicines after you have completely understood and accpet all the possible adverse reactions/side effects.   Do not drive, operating heavy machinery, perform activities at heights, swimming or participation in water activities or provide baby sitting services if your were admitted for syncope or siezures until you have seen by Primary MD or a Neurologist and advised to do so again.  Do not drive when taking Pain medications.    Do not take more than prescribed Pain, Sleep and Anxiety Medications  Special Instructions: If you have smoked or chewed  Tobacco  in the last 2 yrs please stop smoking, stop any regular Alcohol  and or any Recreational drug use.  Wear Seat belts while driving.   Please note  You were cared for by a hospitalist during your hospital stay. If you have any questions about your discharge medications or the care you received while you were in the hospital after you are discharged, you can call the unit and asked to speak with the hospitalist on call if the hospitalist that took care of you is not available. Once you are discharged, your primary care physician will handle any further medical issues. Please note that NO REFILLS for any discharge medications will be authorized once you are discharged, as it is imperative that you return to your primary care physician (or establish a relationship with a primary care physician if you do not have one) for your aftercare needs so that they can reassess your need for medications and monitor your lab values.

## 2016-03-13 NOTE — Progress Notes (Signed)
CM received a call from Dr Randol KernElgergawy stating that he would like to discharge the patient to his group home today on Lantus.  CSW notified and will discuss Lantus with group home to determine if they are able to take patient back.  CSW shared that patient's POA was requesting a dietician consult for information regarding patient's diet.  She would like this information to be sent to the group home at discharge.  Dr Randol KernElgergawy is in agreement. Order was placed. CM called the dietician to discuss request and notify them that the plan is to discharge patient today.  CM will update Dr Randol KernElgergawy once information is available.    Elmer Balesourtney Milton Sagona RN, MSN [phone]

## 2016-03-13 NOTE — Progress Notes (Signed)
Order placed for 30 day event monitor. Our office will contact patient.

## 2016-03-13 NOTE — Progress Notes (Signed)
Pt being discharged via stretcher by [facility] and going to group home. Pt was D/C tele and IV. AVC was given to [facility]. D/C instructions and education given to patient regarding insulin pen and how to use it. Pt used a model and practiced how he will inject the insulin once back at group home. Pt questions asked and answered. Pt left floor at 17:52 via stretcher.  [name], [name]

## 2016-03-13 NOTE — Progress Notes (Signed)
CSW spoke with patient's brother regarding disposition plan. Per brother, he would like for the patient to return back to his group home. Advised CSW to get in contact with facility.   CSW spoke with facility representative [facility]Mary Woods at [facility] regarding patient's return. Per Corrie DandyMary, patient can return back on today, however she will be unable to pick up the patient. She has advised CSW to set up non emergency ambulance transport for the patient.   CSW awaiting discharge summary. CSW to contact patient's brother Onalee HuaDavid to inform of transportation arrangements. CSW will then arrange transport via [facility] to [facility] on today.   CSW will continue to follow and provide support to patient and family while in hospital.   Fernande BoydenJoyce Malone Vanblarcom, [facility] CtrCSWA Clinical Social Worker [facility] Ph: [phone]

## 2016-03-13 NOTE — Progress Notes (Signed)
Noted patient will be discharged on insulin and according to nursing staff patient will need assistance with insulin administration. Called Courtney Robarge, RN, MSN CM to discuss patient discharging on insulin but CM was unsure at this point if patient will be able to go back to the group home new to insulin. Called Mellody LifeMary Woods [facility] and she stated that the patient can return to the group home with insulin. Mellody LifeMary Woods states that they have a Med Tech who provides the medication to the patient and the patient will have to take the medication himself. Mellody LifeMary Woods states that insulin pens are easiest for them and that the Med Tech can get the insulin pen ready (apply insulin pen needle and dialing up dosage) but the patient will need to be able to self administer the insulin. Mickie Kayalled Jasmine, RN caring for patient today and asked that she use the insulin pen teaching station on the unit to teach the patient how to inject insulin using an insulin pen. If patient is able to self-inject with the insulin pen, please provide Rx for insulin pen(s) and insulin pen needles at time of discharge. Called Elmer Balesourtney Robarge, RN, MSN, CM to inform her of conversations with Mellody LifeMary Woods and [place], [place].   Thanks, Orlando PennerMarie Lorin Gawron, RN, MSN, CDE Diabetes Coordinator [facility] [phone] (Team Pager from 8am to 5pm) [phone] ([facility] office) [phone] [facility] office) [phone] [facility] office)

## 2016-03-13 NOTE — Progress Notes (Signed)
  RD consulted to provide additional handouts regarding diabetes and carb modified diet for staff at pt's group home. RD dropped off handouts and explained what they were for. Pt asked what he is not allowed to eat.   Lab Results  Component Value Date   HGBA1C 13.7* 03/10/2016    RD provided "Type 2  Diabetes Nutrition Therapy" handout from the [employer] of [employer]. Reviewed patient's dietary recall. Told pt he can no longer drink regular soda, but he can drink diet soda or sugar-free beverages such as Crystal-Light and unsweet tea. Encouraged pt to limit pizza to 3 slices instead of 4 and eat a side of vegetables in place of the 4th slice. Pt very agreeable. States that he doesn't eat candy and rarely eats cookies any ways. There is another man with diabetes in the group home who takes insulin too;  He feels he will have the support he needs.   Teach back method used. Expect good compliance.  Body mass index is 27.49 kg/(m^2). Pt meets criteria for Overweight based on current BMI.  Current diet order is Carb Modified, patient is consuming approximately 100% of meals at this time. Labs and medications reviewed. No further nutrition interventions warranted at this time. RD contact information provided. If additional nutrition issues arise, please re-consult RD.  Dorothea Ogleeanne Calieb Lichtman RD, LDN Inpatient Clinical Dietitian Pager: [phone] After Hours Pager: [phone]

## 2016-03-13 NOTE — Discharge Summary (Addendum)
Larry Riggs, is a 60 y.o. male  DOB August 16, 1956  MRN [id number].  Admission date:  03/09/2016  Admitting Physician  Larry Art, DO  Discharge Date:  03/13/2016   Primary MD  Larry Mustache, MD  Recommendations for primary care physician for things to follow:  - Check CBC, BMP in 1 week - Please monitor diabetes mellitus closely, and adjust Lantus dose as needed - Patient to follow with neurology in 2 weeks - Cardiology will arrange for 30 days event monitor   Admission Diagnosis  Hypokalemia [E87.6] Syncope, unspecified syncope type [R55]   Discharge Diagnosis  Hypokalemia [E87.6] Syncope, unspecified syncope type [R55]    Active Problems:   Coronary atherosclerosis of native coronary vessel   Hypertension   Type II diabetes mellitus (HCC)   Syncope and collapse   Seizures (HCC)   Hypokalemia   Hyperglycemia   Syncope   CVA (cerebral infarction)   Elevated troponin      Past Medical History  Diagnosis Date  . Obesity   . Mildly mentally retarded   . Coronary atherosclerosis of native coronary vessel     a. 08/2013 Cath: signif 3V dzs w EF of 55%, eval @ [facility] for CABG but felt that there was no good target in the LAD;  b. 12/2013 Cath/PCI: LM nl, LAD 17m (PTCA), 95d (attempted PTCA), subtl distal, D1 95 (2.75x16 Promus DES), D2 90ost, D3 80-90ost, D4 60ost, LCX 90p, small, RCA 23m, PDA nl, RPL1 60, RPL2 90ost.  . Hyperlipidemia   . Hypertension   . Asthma   . Type II diabetes mellitus (HCC)   . GERD (gastroesophageal reflux disease)   . Dysphagia     Past Surgical History  Procedure Laterality Date  . Inguinal hernia repair Right 1990's  . Cardiac catheterization  08/2013    [facility]  . Coronary angioplasty with stent placement  12/27/2013    "1"  . Percutaneous coronary stent intervention (pci-s) N/A 12/27/2013    Procedure: PERCUTANEOUS CORONARY STENT INTERVENTION (PCI-S);   Surgeon: Larry Ouch, MD;  Location: [facility] CATH LAB;  Service: Cardiovascular;  Laterality: N/A;       History of present illness and  Hospital Course:     Kindly see H&P for history of present illness and admission details, please review complete Labs, Consult reports and Test reports for all details in brief  HPI  from the history and physical done on the day of admission 03/09/2016 HPI: Larry Riggs is a 59 y.o. male with medical history significant of obesity, CAD (3 vessel disease but no CABG), and mental retardation living in a group home. Story is obtained from ER physician and EMS report as patient unable to give history and I was unable to reach group home.  Group Home worker reports patient was in the bathroom "throwing up" and they heard a "yell" and then a crash and then found him on the floor. When they arrived, he appeared to be having a seizure. He had cuts to the  right side of his face. He was also confused as he "came to". He slowly in the ER became less confused. Larry Riggs also reported that Group home workers were unable to find a pulse so they did CPR. When EMS arrived and they stopped CPR, he had a pulse and was combative.   Per Larry Riggs, patient's baseline is confused at times.    ED Course: IN the ER, labs were remarkable for a mild WBC count, an elevated glucose, hyponatremia and hypokalemia. His BP was markedly elevated as well. Patient was unwilling to take PO home meds so SDU bed requested as BP gtt may be needed.   Hospital Course   Acute ischemic CVA - MRI brain significant for Multi focal acute subcentimeter ischemic infarcts involving the supratentorial and infratentorial brain Seizures vs vaso-vagal syncope (reportedly vomiting) vs cardiac arrest - Seen by neurology - CTA head and neck significant for Extensive calcific atherosclerosis in multiple vessels in head and neck  - 2-D echo significant For EF 45-50%, with dyskinesis and apical  myocardium, and diastolic dysfunction - LDL is 409,  - glycohemoglobin A1c is 13.7 - Cardiology will arrange for 30 days event monitor to rule out PAF - Patient with acute ischemic CVA, workup significant for extensive calcific atherosclerosis, poorly controlled hyperlipidemia, and diabetes mellitus, condition or his for dual antiplatelet therapy, to continue home aspirin and Plavix, high intensity statin, will increase Lipitor to 80 mg daily on discharge, and will need better controlled diabetes mellitus and hypertension.  Disorder of loss of consciousness  - Unlikely related to seizure , Questionable  vaso-vagal syncope (reportedly vomiting)  - Loss of consciousness most likely related to acute CVA more than actual seizures - Neurology consult appreciated, currently off Keppra   Elevated troponin with abnormal echo - Likely on the setting of CPR performed at facility, as well secondary to CVA, upon is trending down 0.29> 0.24> 0.21, 2-D echo significant for apical wall dyskinesis, cardiology consult appreciated. - Continue with aspirin, Plavix, and statin  HTN- urgency -? Accuracy of reading as arms are stiffened and bent - Acceptable on home medication  Dysphagia Initially on dysphagia 2 diet, currently advanced to diet carb modified with thin liquid  Hypokalemia - Repleted,   DKA/uncontrolled type 2 diabetes mellitus -gap > 15, initially on insulin drip - HbA1C is 13.7 she is indicative of very poorly controlled diabetes mellitus, will continue with metformin and glipizide, started on Lantus, dosed need to be titrated at facility.  Fever - patient with temperature of 101 on 4/24, afebrile since, unclear etiology, chest x-ray with no acute findings, blood culture with no growth to date, no signs of meningitis, active urinalysis, a febrile over last 48 hours   AKI - resolved with with IV fluids     Discharge Condition:  Stable   Follow UP  Follow-up Information     Follow up with Larry Riggs, NP. Schedule an appointment as soon as possible for a visit in 2 months.   Specialty:  Family Medicine   Contact information:   [address] [place] [address] [phone]       Follow up with Larry Mustache, MD.   Specialty:  Internal Medicine   Contact information:   [address]   [place] [address] [phone]         Discharge Instructions  and  Discharge Medications         Discharge Instructions    Ambulatory referral to Neurology  Complete by:  As directed   [facility]yn [facility] 2 months. Thanks.     Discharge instructions    Complete by:  As directed   Follow with Primary MD Larry MustacheFayegh Jadali, MD in 7 days   Get CBC, CMP, checked  by Primary MD next visit.    Activity: As tolerated with Full fall precautions use walker/cane & assistance as needed   Disposition Group Home   Diet: Heart Healthy , carbohydrate modified , with feeding assistance and aspiration precautions.  For Heart failure patients - Check your Weight same time everyday, if you gain over 2 pounds, or you develop in leg swelling, experience more shortness of breath or chest pain, call your Primary MD immediately. Follow Cardiac Low Salt Diet and 1.5 lit/day fluid restriction.   On your next visit with your primary care physician please Get Medicines reviewed and adjusted.   Please request your Prim.MD to go over all Hospital Tests and Procedure/Radiological results at the follow up, please get all Hospital records sent to your Prim MD by signing hospital release before you go home.   If you experience worsening of your admission symptoms, develop shortness of breath, life threatening emergency, suicidal or homicidal thoughts you must seek medical attention immediately by calling 911 or calling your MD immediately  if symptoms less severe.  You Must read complete instructions/literature along with all the  possible adverse reactions/side effects for all the Medicines you take and that have been prescribed to you. Take any new Medicines after you have completely understood and accpet all the possible adverse reactions/side effects.   Do not drive, operating heavy machinery, perform activities at heights, swimming or participation in water activities or provide baby sitting services if your were admitted for syncope or siezures until you have seen by Primary MD or a Neurologist and advised to do so again.  Do not drive when taking Pain medications.    Do not take more than prescribed Pain, Sleep and Anxiety Medications  Special Instructions: If you have smoked or chewed Tobacco  in the last 2 yrs please stop smoking, stop any regular Alcohol  and or any Recreational drug use.  Wear Seat belts while driving.   Please note  You were cared for by a hospitalist during your hospital stay. If you have any questions about your discharge medications or the care you received while you were in the hospital after you are discharged, you can call the unit and asked to speak with the hospitalist on call if the hospitalist that took care of you is not available. Once you are discharged, your primary care physician will handle any further medical issues. Please note that NO REFILLS for any discharge medications will be authorized once you are discharged, as it is imperative that you return to your primary care physician (or establish a relationship with a primary care physician if you do not have one) for your aftercare needs so that they can reassess your need for medications and monitor your lab values.            Medication List    STOP taking these medications        sitaGLIPtin 100 MG tablet  Commonly known as:  JANUVIA      TAKE these medications        albuterol 108 (90 Base) MCG/ACT inhaler  Commonly known as:  PROVENTIL HFA;VENTOLIN HFA  Inhale 1 puff into the lungs 3 (three)  times daily as  needed.     amLODipine 2.5 MG tablet  Commonly known as:  NORVASC  Take 1 tablet (2.5 mg total) by mouth daily.     aspirin 81 MG tablet  Take 81 mg by mouth daily.     atorvastatin 80 MG tablet  Commonly known as:  LIPITOR  Take 1 tablet (80 mg total) by mouth daily.     clopidogrel 75 MG tablet  Commonly known as:  PLAVIX  TAKE 1 TABLET DAILY     DULoxetine 60 MG capsule  Commonly known as:  CYMBALTA  Take 120 mg by mouth daily.     glipiZIDE 5 MG 24 hr tablet  Commonly known as:  GLUCOTROL XL  Take 5 mg by mouth 2 (two) times daily.     hydrochlorothiazide 25 MG tablet  Commonly known as:  HYDRODIURIL  Take 25 mg by mouth daily.     Insulin Glargine 100 UNIT/ML Solostar Pen  Commonly known as:  LANTUS  Inject 12 Units into the skin every morning.     Insulin Pen Needle 29G X 12.7MM Misc  Use with lantus pen     lisinopril 40 MG tablet  Commonly known as:  PRINIVIL,ZESTRIL  Take 40 mg by mouth daily.     metFORMIN 1000 MG tablet  Commonly known as:  GLUCOPHAGE  Take 1 tablet (1,000 mg total) by mouth 2 (two) times daily with a meal.     metoprolol 50 MG tablet  Commonly known as:  LOPRESSOR  Take 50 mg by mouth 2 (two) times daily.     pantoprazole 40 MG tablet  Commonly known as:  PROTONIX  Take 40 mg by mouth daily.     potassium chloride 10 MEQ tablet  Commonly known as:  K-DUR,KLOR-CON  Take 10 mEq by mouth daily.     RISPERDAL 4 MG tablet  Generic drug:  risperidone  Take 4 mg by mouth daily.     traZODone 50 MG tablet  Commonly known as:  DESYREL  Take 50 mg by mouth at bedtime.     Vitamin D (Ergocalciferol) 50000 units Caps capsule  Commonly known as:  DRISDOL  Take 50,000 Units by mouth every 7 (seven) days.          Diet and Activity recommendation: See Discharge Instructions above   Consults obtained -  Neurology Cardiology   Major procedures and Radiology Reports - PLEASE review detailed and final reports for all details,  in brief -      Ct Angio Head W/cm &/or Wo Cm  03/11/2016  CLINICAL DATA:  Fall with trauma to the head 1 week ago. Behavioral changes since then with aggression. EXAM: CT ANGIOGRAPHY HEAD AND NECK TECHNIQUE: Multidetector CT imaging of the head and neck was performed using the standard protocol during bolus administration of intravenous contrast. Multiplanar CT image reconstructions and MIPs were obtained to evaluate the vascular anatomy. Carotid stenosis measurements (when applicable) are obtained utilizing NASCET criteria, using the distal internal carotid diameter as the denominator. CONTRAST:  50 cc Isovue 370 COMPARISON:  MRI 03/10/2016.  CT 03/09/2016. FINDINGS: CT HEAD Chronic small-vessel ischemic changes are again demonstrated in the brainstem, cerebellum, thalami, basal ganglia and hemispheric white matter. No sign of acute infarction, mass lesion, hemorrhage, hydrocephalus or extra-axial collection. CTA NECK Aortic arch: Mild atherosclerosis of the arch in the origins of the brachiocephalic vessels. No evidence of aneurysm or dissection. Right carotid system: Common carotid artery shows atherosclerotic plaque but  is widely patent to the bifurcation region. There is advanced atherosclerosis at the carotid bifurcation with extensive calcified plaque. Narrowing in the ICA bulb region shows a minimal diameter of 2.5 mm. Compared to a more distal cervical ICA diameter of 5 mm, this indicates a 50% stenosis. Cervical internal carotid artery is widely patent distal to the bulb. Left carotid system: Common carotid artery shows plaque but no stenosis. There is extensive calcified plaque at the carotid bifurcation and proximal ICA. Minimal diameter is 3 mm. Compared to a more distal cervical ICA diameter of 5 mm, this indicates a 40% stenosis. Beyond the bulb, the ICA is widely patent. Vertebral arteries:There is calcified plaque at both vertebral artery origins. The left vertebral artery is dominant.  Stenosis at the origin is estimated at 50%. Stenosis at the right vertebral artery is difficult to measure clearly, but is probably also approximately 50%. Beyond that, the vessels are widely patent through the cervical region. Skeleton: Ordinary cervical spondylosis. Other neck: No mass or lymphadenopathy.  Lung apices are clear. CTA HEAD Anterior circulation: Both internal carotid arteries are patent through the siphon region. There is extensive atherosclerotic calcification in the carotid siphon region bilaterally. Stenosis is estimated at 50% on each side. The anterior and middle cerebral vessels are patent without proximal stenosis, aneurysm or vascular malformation. Posterior circulation: Both vertebral arteries are patent with the left being dominant. The right vertebral artery terminates in PICA. The left vertebral artery shows atherosclerotic disease proximal to the basilar with fusiform aneurysmal dilatation to a diameter of 6 mm. The lumen is narrowed to a diameter of 1 mm. This is probably a functional stenosis of 80% of the distal left vertebral artery. The basilar artery shows multiple foci of stenosis with the limb an measuring 1 mm or less. Vertebral artery terminates in the superior cerebellar arteries, with the posterior cerebral arteries receiving there supply from the anterior circulation. Venous sinuses: Patent and normal Anatomic variants: None significant Delayed phase: No abnormal enhancement IMPRESSION: Extensive calcific atherosclerosis at both carotid bifurcations. 50% stenosis of the proximal ICA on the right and 40% stenosis of the proximal ICA on the left. 50% stenosis at both vertebral artery origins. Atherosclerotic disease in the carotid siphon regions bilaterally with stenosis estimated at 50% in those regions. Right vertebral artery terminates in PICA. Left vertebral artery supplies the basilar. 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 Severe atherosclerotic disease with focal stenosis estimated at 80% at the distal vertebral level. Serial stenoses of the basilar artery with the lumen measuring 1 mm or less. Electronically Signed   By: Paulina Fusi M.D.   On: 03/11/2016 19:30   Ct Head Wo Contrast  03/09/2016  CLINICAL DATA:  Fall today, head injury EXAM: CT HEAD WITHOUT CONTRAST CT CERVICAL SPINE WITHOUT CONTRAST TECHNIQUE: Multidetector CT imaging of the head and cervical spine was performed following the standard protocol without intravenous contrast. Multiplanar CT image reconstructions of the cervical spine were also generated. COMPARISON:  None. FINDINGS: CT HEAD FINDINGS No skull fracture is noted. There is mucosal thickening with partial opacification bilateral maxillary sinus. The mastoid air cells are unremarkable. No intracranial hemorrhage, mass effect or midline shift. Mild cerebral atrophy. No acute cortical infarction. There is old appearing infarct in right posterior cerebellum measures about 1.8 cm. Atherosclerotic calcifications are noted left vertebral artery and basilar artery. Mild periventricular white matter decreased attenuation probable due to chronic small vessel ischemic changes. No mass  lesion is noted on this unenhanced scan. CT CERVICAL SPINE FINDINGS Axial images of the cervical spine shows no acute fracture or subluxation. Computer processed images shows no acute fracture or subluxation. Degenerative changes are noted C1-C2 articulation. Mild disc space flattening with posterior spurring at C3-C4 level. Mild disc space flattening with mild anterior and mild posterior spurring at C4-C5 level. Moderate disc space flattening with mild anterior and moderate posterior spurring at C6-C7 level. No prevertebral soft tissue swelling. Cervical airway is patent. There is no pneumothorax in visualized lung apices. IMPRESSION: 1. No acute intracranial abnormality. Mild cerebral atrophy. Mild periventricular white matter  decreased attenuation probable due to chronic small vessel ischemic changes. Bilateral maxillary sinus mucosal thickening with partial opacification. 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 No definite acute cortical infarction. 2. No cervical spine acute fracture or subluxation. Multilevel degenerative changes as described above. Electronically Signed   By: Natasha Mead M.D.   On: 03/09/2016 14:58   Ct Angio Neck W/cm &/or Wo/cm  03/11/2016  CLINICAL DATA:  Fall with trauma to the head 1 week ago. Behavioral changes since then with aggression. EXAM: CT ANGIOGRAPHY HEAD AND NECK TECHNIQUE: Multidetector CT imaging of the head and neck was performed using the standard protocol during bolus administration of intravenous contrast. Multiplanar CT image reconstructions and MIPs were obtained to evaluate the vascular anatomy. Carotid stenosis measurements (when applicable) are obtained utilizing NASCET criteria, using the distal internal carotid diameter as the denominator. CONTRAST:  50 cc Isovue 370 COMPARISON:  MRI 03/10/2016.  CT 03/09/2016. FINDINGS: CT HEAD Chronic small-vessel ischemic changes are again demonstrated in the brainstem, cerebellum, thalami, basal ganglia and hemispheric white matter. No sign of acute infarction, mass lesion, hemorrhage, hydrocephalus or extra-axial collection. CTA NECK Aortic arch: Mild atherosclerosis of the arch in the origins of the brachiocephalic vessels. No evidence of aneurysm or dissection. Right carotid system: Common carotid artery shows atherosclerotic plaque but is widely patent to the bifurcation region. There is advanced atherosclerosis at the carotid bifurcation with extensive calcified plaque. Narrowing in the ICA bulb region shows a minimal diameter of 2.5 mm. Compared to a more distal cervical ICA diameter of 5 mm, this indicates a 50% stenosis. Cervical internal carotid artery is widely patent distal to the bulb. Left carotid system: Common  carotid artery shows plaque but no stenosis. There is extensive calcified plaque at the carotid bifurcation and proximal ICA. Minimal diameter is 3 mm. Compared to a more distal cervical ICA diameter of 5 mm, this indicates a 40% stenosis. Beyond the bulb, the ICA is widely patent. Vertebral arteries:There is calcified plaque at both vertebral artery origins. The left vertebral artery is dominant. Stenosis at the origin is estimated at 50%. Stenosis at the right vertebral artery is difficult to measure clearly, but is probably also approximately 50%. Beyond that, the vessels are widely patent through the cervical region. Skeleton: Ordinary cervical spondylosis. Other neck: No mass or lymphadenopathy.  Lung apices are clear. CTA HEAD Anterior circulation: Both internal carotid arteries are patent through the siphon region. There is extensive atherosclerotic calcification in the carotid siphon region bilaterally. Stenosis is estimated at 50% on each side. The anterior and middle cerebral vessels are patent without proximal stenosis, aneurysm or vascular malformation. Posterior circulation: Both vertebral arteries are patent with the left being dominant. The right vertebral artery terminates in PICA. The left vertebral artery shows atherosclerotic disease proximal to the basilar with fusiform aneurysmal dilatation to a diameter of 6 mm.  The lumen is narrowed to a diameter of 1 mm. This is probably a functional stenosis of 80% of the distal left vertebral artery. The basilar artery shows multiple foci of stenosis with the limb an measuring 1 mm or less. Vertebral artery terminates in the superior cerebellar arteries, with the posterior cerebral arteries receiving there supply from the anterior circulation. Venous sinuses: Patent and normal Anatomic variants: None significant Delayed phase: No abnormal enhancement IMPRESSION: Extensive calcific atherosclerosis at both carotid bifurcations. 50% stenosis of the proximal  ICA on the right and 40% stenosis of the proximal ICA on the left. 50% stenosis at both vertebral artery origins. Atherosclerotic disease in the carotid siphon regions bilaterally with stenosis estimated at 50% in those regions. Right vertebral artery terminates in PICA. Left vertebral artery supplies the basilar. 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 Severe atherosclerotic disease with focal stenosis estimated at 80% at the distal vertebral level. Serial stenoses of the basilar artery with the lumen measuring 1 mm or less. Electronically Signed   By: Paulina Fusi M.D.   On: 03/11/2016 19:30   Ct Cervical Spine Wo Contrast  03/09/2016  CLINICAL DATA:  Fall today, head injury EXAM: CT HEAD WITHOUT CONTRAST CT CERVICAL SPINE WITHOUT CONTRAST TECHNIQUE: Multidetector CT imaging of the head and cervical spine was performed following the standard protocol without intravenous contrast. Multiplanar CT image reconstructions of the cervical spine were also generated. COMPARISON:  None. FINDINGS: CT HEAD FINDINGS No skull fracture is noted. There is mucosal thickening with partial opacification bilateral maxillary sinus. The mastoid air cells are unremarkable. No intracranial hemorrhage, mass effect or midline shift. Mild cerebral atrophy. No acute cortical infarction. There is old appearing infarct in right posterior cerebellum measures about 1.8 cm. Atherosclerotic calcifications are noted left vertebral artery and basilar artery. Mild periventricular white matter decreased attenuation probable due to chronic small vessel ischemic changes. No mass lesion is noted on this unenhanced scan. CT CERVICAL SPINE FINDINGS Axial images of the cervical spine shows no acute fracture or subluxation. Computer processed images shows no acute fracture or subluxation. Degenerative changes are noted C1-C2 articulation. Mild disc space flattening with posterior spurring at C3-C4 level.  Mild disc space flattening with mild anterior and mild posterior spurring at C4-C5 level. Moderate disc space flattening with mild anterior and moderate posterior spurring at C6-C7 level. No prevertebral soft tissue swelling. Cervical airway is patent. There is no pneumothorax in visualized lung apices. IMPRESSION: 1. No acute intracranial abnormality. Mild cerebral atrophy. Mild periventricular white matter decreased attenuation probable due to chronic small vessel ischemic changes. Bilateral maxillary sinus mucosal thickening with partial opacification. 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 No definite acute cortical infarction. 2. No cervical spine acute fracture or subluxation. Multilevel degenerative changes as described above. Electronically Signed   By: Natasha Mead M.D.   On: 03/09/2016 14:58   Mr Brain Wo Contrast  03/10/2016  CLINICAL DATA:  Initial evaluation for acute altered mental status, seizure. EXAM: MRI HEAD WITHOUT CONTRAST TECHNIQUE: Multiplanar, multiecho pulse sequences of the brain and surrounding structures were obtained without intravenous contrast. COMPARISON:  Prior CT from 03/09/2016. FINDINGS: Diffuse prominence of the CSF containing spaces is compatible with generalized cerebral atrophy. Patchy and confluent T2/FLAIR hyperintensity within the periventricular and deep white matter most consistent with chronic small vessel ischemic disease, mild in nature. Chronic small vessel ischemic disease present within the pons as well. Scattered remote lacunar infarcts  within the bilateral basal ganglia thalami. Remote lacunar infarcts within the central pons. There is a remote right cerebellar infarct. There are scattered sub cm multi focal acute ischemic infarcts involving the supratentorial and infratentorial brain. Specifically, 6 mm infarct within the right caudate head. Small 1 cm curvilinear infarct within the posterior right lentiform nucleus. Subcentimeter cortical  infarct within the right operculum. Small periventricular ischemic infarct adjacent to the right lateral ventricle. Few small right temporal lobe infarcts noted. Few small periatrial white matter infarcts adjacent to the left lateral ventricle. Small subcentimeter cortical infarcts within the left temporal lobe. Additionally, there are a few small subcentimeter infratentorial infarcts involving the cerebellar hemispheres bilaterally. These are likely embolic in nature from a central source given the involvement of various vascular distributions. No associated mass effect or hemorrhage. Major intravascular flow voids are maintained. No mass lesion, midline shift, or mass effect. No hydrocephalus. No extra-axial fluid collection. Thin section imaging through the hippocampi demonstrates normal morphology and signal intensity bilaterally. Craniocervical junction within normal limits. Visualized upper cervical spine unremarkable. Pituitary gland within normal limits. No acute abnormality about the orbits. Scattered mucosal thickening throughout the paranasal sinuses, greatest within the maxillary sinuses. No air-fluid levels to suggest active sinus infection. Mastoid air cells are clear. Inner ear structures normal. Bone marrow signal intensity within normal limits. No scalp soft tissue abnormality. IMPRESSION: 1. Multi focal acute subcentimeter ischemic infarcts involving the supratentorial and infratentorial brain as above. 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 2. Multiple chronic lacunar infarctions involving the bilateral basal ganglia, thalami, and brainstem, with additional remote right cerebellar infarct. 3. Age-related cerebral atrophy with mild chronic small vessel ischemic disease. Electronically Signed   By: Rise Mu M.D.   On: 03/10/2016 22:59   Dg Chest Port 1 View  03/09/2016  CLINICAL DATA:  Seizure, vomiting EXAM: PORTABLE CHEST 1  VIEW COMPARISON:  12/15/2013 FINDINGS: Cardiac silhouette upper normal. Vascular pattern normal. Lungs clear. No effusions. IMPRESSION: No active disease. Electronically Signed   By: Esperanza Heir M.D.   On: 03/09/2016 15:46    Micro Results    Recent Results (from the past 240 hour(s))  MRSA PCR Screening     Status: None   Collection Time: 03/09/16  6:21 PM  Result Value Ref Range Status   MRSA by PCR NEGATIVE NEGATIVE Final    Comment:        The GeneXpert MRSA Assay (FDA approved for NASAL specimens only), is one component of a comprehensive MRSA colonization surveillance program. It is not intended to diagnose MRSA infection nor to guide or monitor treatment for MRSA infections.   Culture, blood (Routine X 2) w Reflex to ID Panel     Status: None (Preliminary result)   Collection Time: 03/09/16  6:36 PM  Result Value Ref Range Status   Specimen Description BLOOD RIGHT HAND  Final   Special Requests BOTTLES DRAWN AEROBIC AND ANAEROBIC 10CC  Final   Culture NO GROWTH 4 DAYS  Final   Report Status PENDING  Incomplete  Culture, blood (Routine X 2) w Reflex to ID Panel     Status: None (Preliminary result)   Collection Time: 03/09/16  6:37 PM  Result Value Ref Range Status   Specimen Description BLOOD LEFT ANTECUBITAL  Final   Special Requests BOTTLES DRAWN AEROBIC ONLY 5CC  Final   Culture NO GROWTH 4 DAYS  Final   Report Status PENDING  Incomplete  Today   Subjective:   Kristofor Michalowski today denies any complaints, no chest pain, no shortness of breath, no focal deficits  Objective:   Blood pressure 127/80, pulse 70, temperature 98.6 F (37 C), temperature source Oral, resp. rate 18, height 5\' 10"  (1.778 m), weight 86.9 kg (191 lb 9.3 oz), SpO2 100 %.  No intake or output data in the 24 hours ending 03/13/16 1546  Exam General exam: Awake, alert, pleasant Respiratory system: Clear to auscultation. Respiratory effort normal. Cardiovascular system: S1 & S2  heard, RRR. No JVD, murmurs, rubs, gallops or clicks. No pedal edema. Gastrointestinal system: Abdomen is nondistended, soft and nontender. No organomegaly or masses felt. Normal bowel sounds heard. Skin: excoriations on b/l legs Data Review   CBC w Diff:  Lab Results  Component Value Date   WBC 5.9 03/13/2016   WBC 8.2 12/15/2013   HGB 14.2 03/13/2016   HCT 41.8 03/13/2016   PLT 177 03/13/2016   LYMPHOPCT 10 03/09/2016   MONOPCT 4 03/09/2016   EOSPCT 1 03/09/2016   BASOPCT 0 03/09/2016    CMP:  Lab Results  Component Value Date   NA 138 03/13/2016   NA 134 12/15/2013   K 3.3* 03/13/2016   CL 102 03/13/2016   CO2 28 03/13/2016   BUN 8 03/13/2016   BUN 15 12/15/2013   CREATININE 0.87 03/13/2016   PROT 8.1 03/09/2016   ALBUMIN 4.1 03/09/2016   BILITOT 1.1 03/09/2016   ALKPHOS 106 03/09/2016   AST 28 03/09/2016   ALT 24 03/09/2016  .   Total Time in preparing paper work, data evaluation and todays exam - 35 minutes  Ahliyah Nienow M.D on 03/13/2016 at 3:46 PM  [facility]   Office  [phone]

## 2016-03-13 NOTE — Progress Notes (Signed)
Handoff report given to Amsc LLConia RN.

## 2016-03-14 LAB — CULTURE, BLOOD (ROUTINE X 2)
CULTURE: NO GROWTH
CULTURE: NO GROWTH

## 2016-03-19 ENCOUNTER — Encounter: Payer: Self-pay | Admitting: Podiatry

## 2016-03-19 ENCOUNTER — Ambulatory Visit (INDEPENDENT_AMBULATORY_CARE_PROVIDER_SITE_OTHER): Payer: Medicare Other | Admitting: Podiatry

## 2016-03-19 DIAGNOSIS — M79676 Pain in unspecified toe(s): Secondary | ICD-10-CM | POA: Diagnosis not present

## 2016-03-19 DIAGNOSIS — E119 Type 2 diabetes mellitus without complications: Secondary | ICD-10-CM | POA: Diagnosis not present

## 2016-03-19 DIAGNOSIS — B351 Tinea unguium: Secondary | ICD-10-CM | POA: Diagnosis not present

## 2016-03-19 NOTE — Progress Notes (Signed)
Patient ID: Larry Riggs, male   DOB: 01/02/1956, 60 y.o.   MRN: [id number]   Subjective: 60 y.o.-year-old male returns the office today for painful, elongated, thickened toenails which he is unable to trim himself. Denies any redness or drainage around the nails. Denies any acute changes since last appointment and no new complaints today. Denies any systemic complaints such as fevers, chills, nausea, vomiting.  Objective: AAO 3, NAD DP/PT pulses palpable, CRT less than 3 seconds  Nails hypertrophic, dystrophic, elongated, brittle, discolored 10. There is tenderness overlying the nails 1-5 bilaterally. There is no surrounding erythema or drainage along the nail sites. No open lesions or pre-ulcerative lesions are identified bilaterally.  No other areas of tenderness bilateral lower extremities. No overlying edema, erythema, increased warmth. No pain with calf compression, swelling, warmth, erythema.  Assessment: Patient presents with symptomatic onychomycosis  Plan: -Treatment options including alternatives, risks, complications were discussed -Nails sharply debrided 10 without complication/bleeding. -Discussed daily foot inspection. If there are any changes, to call the office immediately.  -Follow-up in 3 months or sooner if any problems are to arise. In the meantime, encouraged to call the office with any questions, concerns, changes symptoms.  Matthew Wagoner, DPM 

## 2016-03-23 ENCOUNTER — Encounter: Payer: Self-pay | Admitting: *Deleted

## 2016-03-23 NOTE — Progress Notes (Signed)
Patient ID: Larry HeinzKent H Riggs, male   DOB: July 31, 1956, 60 y.o.   MRN: [id number] Patient did not show up for 03/23/16, 9:30 AM, appointment to have a 30 day cardiac event monitor applied.

## 2016-04-08 ENCOUNTER — Encounter: Payer: Self-pay | Admitting: *Deleted

## 2016-04-08 NOTE — Progress Notes (Signed)
Patient ID: Larry Riggs, male   DOB: 09-Nov-1956, 60 y.o.   MRN: [id number] Patient did not show up for 04/08/16, 11:00 AM, appointment to have a 30 day cardiac event monitor applied.

## 2016-06-04 ENCOUNTER — Ambulatory Visit: Payer: Medicare Other | Admitting: Nurse Practitioner

## 2016-06-05 ENCOUNTER — Encounter: Payer: Self-pay | Admitting: Nurse Practitioner

## 2016-06-23 ENCOUNTER — Encounter: Payer: Self-pay | Admitting: Podiatry

## 2016-06-23 ENCOUNTER — Ambulatory Visit (INDEPENDENT_AMBULATORY_CARE_PROVIDER_SITE_OTHER): Payer: Medicare Other | Admitting: Podiatry

## 2016-06-23 DIAGNOSIS — B351 Tinea unguium: Secondary | ICD-10-CM

## 2016-06-23 DIAGNOSIS — E119 Type 2 diabetes mellitus without complications: Secondary | ICD-10-CM

## 2016-06-23 DIAGNOSIS — M79676 Pain in unspecified toe(s): Secondary | ICD-10-CM

## 2016-06-23 NOTE — Progress Notes (Signed)
Patient ID: Larry Riggs, male   DOB: 04-01-1956, 60 y.o.   MRN: [id number]   Subjective: 60 y.o.-year-old male returns the office today for painful, elongated, thickened toenails which he is unable to trim himself. Denies any redness or drainage around the nails. Denies any acute changes since last appointment and no new complaints today. Denies any systemic complaints such as fevers, chills, nausea, vomiting.  Objective: AAO 3, NAD DP/PT pulses palpable, CRT less than 3 seconds  Nails hypertrophic, dystrophic, elongated, brittle, discolored 10. There is tenderness overlying the nails 1-5 bilaterally. There is no surrounding erythema or drainage along the nail sites. No open lesions or pre-ulcerative lesions are identified bilaterally.  No other areas of tenderness bilateral lower extremities. No overlying edema, erythema, increased warmth. No pain with calf compression, swelling, warmth, erythema.  Assessment: Patient presents with symptomatic onychomycosis  Plan: -Treatment options including alternatives, risks, complications were discussed -Nails sharply debrided 10 without complication/bleeding. -Discussed daily foot inspection. If there are any changes, to call the office immediately.  -Follow-up in 3 months or sooner if any problems are to arise. In the meantime, encouraged to call the office with any questions, concerns, changes symptoms.  Ovid CurdMatthew Rohaan Riggs, DPM

## 2016-09-24 ENCOUNTER — Ambulatory Visit (INDEPENDENT_AMBULATORY_CARE_PROVIDER_SITE_OTHER): Payer: Medicare Other | Admitting: Podiatry

## 2016-09-24 ENCOUNTER — Encounter: Payer: Self-pay | Admitting: Podiatry

## 2016-09-24 DIAGNOSIS — E119 Type 2 diabetes mellitus without complications: Secondary | ICD-10-CM

## 2016-09-24 DIAGNOSIS — M79676 Pain in unspecified toe(s): Secondary | ICD-10-CM

## 2016-09-24 DIAGNOSIS — B351 Tinea unguium: Secondary | ICD-10-CM | POA: Diagnosis not present

## 2016-09-24 NOTE — Progress Notes (Signed)
Patient ID: Larry Riggs, male   DOB: 04/15/1956, 60 y.o.   MRN: [id number]   Subjective: 60 y.o.-year-old male returns the office today for painful, elongated, thickened toenails which he is unable to trim himself. Denies any redness or drainage around the nails. Denies any acute changes since last appointment and no new complaints today. Denies any systemic complaints such as fevers, chills, nausea, vomiting.  Objective: AAO 3, NAD DP/PT pulses palpable, CRT less than 3 seconds  Nails hypertrophic, dystrophic, elongated, brittle, discolored 10. There is tenderness overlying the nails 1-5 bilaterally. There is no surrounding erythema or drainage along the nail sites. No open lesions or pre-ulcerative lesions are identified bilaterally.  No other areas of tenderness bilateral lower extremities. No overlying edema, erythema, increased warmth. No pain with calf compression, swelling, warmth, erythema.  Assessment: Patient presents with symptomatic onychomycosis  Plan: -Treatment options including alternatives, risks, complications were discussed -Nails sharply debrided 10 without complication/bleeding. -Discussed daily foot inspection. If there are any changes, to call the office immediately.  -Follow-up in 3 months or sooner if any problems are to arise. In the meantime, encouraged to call the office with any questions, concerns, changes symptoms.  Kadija Cruzen, DPM 

## 2016-12-28 ENCOUNTER — Ambulatory Visit: Payer: Medicare Other | Admitting: Podiatry

## 2016-12-31 ENCOUNTER — Ambulatory Visit: Payer: Medicare Other | Admitting: Podiatry

## 2017-01-04 ENCOUNTER — Telehealth: Payer: Self-pay | Admitting: Cardiovascular Disease

## 2017-01-04 NOTE — Telephone Encounter (Signed)
3 attempts to schedule appt from recall list.  Lmov.  Deleting recall.

## 2017-02-01 ENCOUNTER — Ambulatory Visit (INDEPENDENT_AMBULATORY_CARE_PROVIDER_SITE_OTHER): Payer: Medicare Other | Admitting: Podiatry

## 2017-02-01 ENCOUNTER — Encounter: Payer: Self-pay | Admitting: Podiatry

## 2017-02-01 DIAGNOSIS — E119 Type 2 diabetes mellitus without complications: Secondary | ICD-10-CM

## 2017-02-01 DIAGNOSIS — B351 Tinea unguium: Secondary | ICD-10-CM | POA: Diagnosis not present

## 2017-02-01 DIAGNOSIS — M79676 Pain in unspecified toe(s): Secondary | ICD-10-CM

## 2017-02-01 NOTE — Progress Notes (Signed)
Complaint:  Visit Type: Patient returns to my office for continued preventative foot care services. Complaint: Patient states" my nails have grown long and thick and become painful to walk and wear shoes" Patient has been diagnosed with DM with no foot complications. The patient presents for preventative foot care services. No changes to ROS  Podiatric Exam: Vascular: dorsalis pedis and posterior tibial pulses are palpable bilateral. Capillary return is immediate. Temperature gradient is WNL. Skin turgor WNL  Sensorium: Normal Semmes Weinstein monofilament test. Normal tactile sensation bilaterally. Nail Exam: Pt has thick disfigured discolored nails with subungual debris noted bilateral entire nail hallux through fifth toenails Ulcer Exam: There is no evidence of ulcer or pre-ulcerative changes or infection. Orthopedic Exam: Muscle tone and strength are WNL. No limitations in general ROM. No crepitus or effusions noted. Foot type and digits show no abnormalities. Bony prominences are unremarkable. Skin: No Porokeratosis. No infection or ulcers  Diagnosis:  Onychomycosis, , Pain in right toe, pain in left toes  Treatment & Plan Procedures and Treatment: Consent by patient was obtained for treatment procedures. The patient understood the discussion of treatment and procedures well. All questions were answered thoroughly reviewed. Debridement of mycotic and hypertrophic toenails, 1 through 5 bilateral and clearing of subungual debris. No ulceration, no infection noted.  Return Visit-Office Procedure: Patient instructed to return to the office for a follow up visit 3 months for continued evaluation and treatment.    Annaleigh Steinmeyer DPM 

## 2017-04-07 ENCOUNTER — Ambulatory Visit: Payer: Federal, State, Local not specified - PPO | Admitting: Gastroenterology

## 2017-04-07 ENCOUNTER — Encounter: Payer: Self-pay | Admitting: Gastroenterology

## 2017-05-10 ENCOUNTER — Ambulatory Visit: Payer: Medicare Other | Admitting: Podiatry

## 2017-06-17 ENCOUNTER — Encounter: Payer: Self-pay | Admitting: Podiatry

## 2017-06-17 ENCOUNTER — Ambulatory Visit (INDEPENDENT_AMBULATORY_CARE_PROVIDER_SITE_OTHER): Payer: Medicare Other | Admitting: Podiatry

## 2017-06-17 DIAGNOSIS — M79676 Pain in unspecified toe(s): Secondary | ICD-10-CM

## 2017-06-17 DIAGNOSIS — B351 Tinea unguium: Secondary | ICD-10-CM | POA: Diagnosis not present

## 2017-06-17 DIAGNOSIS — E119 Type 2 diabetes mellitus without complications: Secondary | ICD-10-CM

## 2017-06-17 NOTE — Progress Notes (Signed)
Complaint:  Visit Type: Patient returns to my office for continued preventative foot care services. Complaint: Patient states" my nails have grown long and thick and become painful to walk and wear shoes" Patient has been diagnosed with DM with no foot complications. The patient presents for preventative foot care services. No changes to ROS  Podiatric Exam: Vascular: dorsalis pedis and posterior tibial pulses are palpable bilateral. Capillary return is immediate. Temperature gradient is WNL. Skin turgor WNL  Sensorium: Normal Semmes Weinstein monofilament test. Normal tactile sensation bilaterally. Nail Exam: Pt has thick disfigured discolored nails with subungual debris noted bilateral entire nail hallux through fifth toenails Ulcer Exam: There is no evidence of ulcer or pre-ulcerative changes or infection. Orthopedic Exam: Muscle tone and strength are WNL. No limitations in general ROM. No crepitus or effusions noted. Foot type and digits show no abnormalities. Bony prominences are unremarkable. Skin: No Porokeratosis. No infection or ulcers  Diagnosis:  Onychomycosis, , Pain in right toe, pain in left toes  Treatment & Plan Procedures and Treatment: Consent by patient was obtained for treatment procedures. The patient understood the discussion of treatment and procedures well. All questions were answered thoroughly reviewed. Debridement of mycotic and hypertrophic toenails, 1 through 5 bilateral and clearing of subungual debris. No ulceration, no infection noted.  Return Visit-Office Procedure: Patient instructed to return to the office for a follow up visit 3 months for continued evaluation and treatment.    Afton Mikelson DPM 

## 2017-07-22 ENCOUNTER — Ambulatory Visit: Payer: Federal, State, Local not specified - PPO | Admitting: Gastroenterology

## 2017-08-18 ENCOUNTER — Ambulatory Visit: Payer: Federal, State, Local not specified - PPO | Admitting: Gastroenterology

## 2017-08-18 ENCOUNTER — Telehealth: Payer: Self-pay | Admitting: Gastroenterology

## 2017-08-18 NOTE — Telephone Encounter (Signed)
Spoke to [place] at the nursing home. He stated he will have Robin call me back to reschedule this appt.

## 2017-09-15 ENCOUNTER — Encounter: Payer: Self-pay | Admitting: Gastroenterology

## 2017-09-15 ENCOUNTER — Ambulatory Visit (INDEPENDENT_AMBULATORY_CARE_PROVIDER_SITE_OTHER): Payer: Medicare Other | Admitting: Gastroenterology

## 2017-09-15 ENCOUNTER — Encounter (INDEPENDENT_AMBULATORY_CARE_PROVIDER_SITE_OTHER): Payer: Self-pay

## 2017-09-15 VITALS — BP 141/87 | Temp 98.0°F | Ht 68.0 in | Wt 220.8 lb

## 2017-09-15 DIAGNOSIS — R1310 Dysphagia, unspecified: Secondary | ICD-10-CM | POA: Diagnosis not present

## 2017-09-15 DIAGNOSIS — R1319 Other dysphagia: Secondary | ICD-10-CM

## 2017-09-15 NOTE — Progress Notes (Signed)
Wyline Mood MD, MRCP(U.K) [address]  [address]  [place], [place] [address]  Main: [phone]  Fax: [phone]   Gastroenterology Consultation  Referring Provider:     Sherrie Mustache, MD Primary Care Physician:  Sherrie Mustache, MD Primary Gastroenterologist:  Dr. Wyline Mood  Reason for Consultation:   Dysphagia         HPI:   Larry Riggs is a 61 y.o. y/o male referred for consultation & management  by Dr. Sherrie Mustache, MD.      Dysphagia: Onset and any progression: ongoing 6 months , getting worse Frequency: about every day when he eats meat , points to his neck where it gets stuck , liquids go down ok. Does not cough after eating, "does not go the wrong way " Foods affected : More for steak and chicken  Prior episodes of impaction: no  History of asthma/allergy : yes asthma  History of heartburn/Reflux : no  Weight loss/weight gain : gained weight   Prior EGD: yes - many years back, found "nothing"  PPI/H2 blocker use : Protonix 40 mg once daily .   He is on plavix. For heart stents - few years back.    Past Medical History:  Diagnosis Date  . Asthma   . Coronary atherosclerosis of native coronary vessel    a. 08/2013 Cath: signif 3V dzs w EF of 55%, eval @ [facility] for CABG but felt that there was no good target in the LAD;  b. 12/2013 Cath/PCI: LM nl, LAD 60m (PTCA), 95d (attempted PTCA), subtl distal, D1 95 (2.75x16 Promus DES), D2 90ost, D3 80-90ost, D4 60ost, LCX 90p, small, RCA 23m, PDA nl, RPL1 60, RPL2 90ost.  . Dysphagia   . GERD (gastroesophageal reflux disease)   . Hyperlipidemia   . Hypertension   . Mildly mentally retarded   . Obesity   . Type II diabetes mellitus (HCC)     Past Surgical History:  Procedure Laterality Date  . CARDIAC CATHETERIZATION  08/2013   [facility]  . CORONARY ANGIOPLASTY WITH STENT PLACEMENT  12/27/2013   "1"  . INGUINAL HERNIA REPAIR Right 1990's  . PERCUTANEOUS CORONARY STENT INTERVENTION (PCI-S) N/A 12/27/2013   Procedure: PERCUTANEOUS CORONARY STENT INTERVENTION (PCI-S);  Surgeon: Iran Ouch, MD;  Location: [facility] CATH LAB;  Service: Cardiovascular;  Laterality: N/A;    Prior to Admission medications   Medication Sig Start Date End Date Taking? Authorizing Provider  amLODipine (NORVASC) 2.5 MG tablet Take 1 tablet (2.5 mg total) by mouth daily. 11/19/15  Yes Iran Ouch, MD  aspirin 81 MG tablet Take 81 mg by mouth daily.   Yes [provider]  atorvastatin (LIPITOR) 80 MG tablet Take 1 tablet (80 mg total) by mouth daily. 03/13/16  Yes Elgergawy, Leana Roe, MD  clopidogrel (PLAVIX) 75 MG tablet TAKE 1 TABLET DAILY 03/06/16  Yes Iran Ouch, MD  DULoxetine HCl 40 MG CPEP  08/18/17  Yes [provider]  glipiZIDE (GLUCOTROL XL) 5 MG 24 hr tablet Take 5 mg by mouth 2 (two) times daily.   Yes [provider]  hydrochlorothiazide (HYDRODIURIL) 25 MG tablet Take 25 mg by mouth daily.   Yes [provider]  JANUVIA 100 MG tablet  08/05/17  Yes [provider]  lisinopril (PRINIVIL,ZESTRIL) 40 MG tablet Take 40 mg by mouth daily.   Yes [provider]  metFORMIN (GLUCOPHAGE) 1000 MG tablet Take 1 tablet (1,000 mg total) by mouth 2 (two) times daily  with a meal. 12/28/13  Yes Ok AnisBerge, Christopher R, NP  metoprolol (LOPRESSOR) 50 MG tablet Take 50 mg by mouth 2 (two) times daily.   Yes [provider]  pantoprazole (PROTONIX) 40 MG tablet Take 40 mg by mouth daily.   Yes [provider]  risperidone (RISPERDAL) 4 MG tablet Take 4 mg by mouth daily.   Yes [provider]  tamsulosin (FLOMAX) 0.4 MG CAPS capsule  09/04/17  Yes [provider]  traZODone (DESYREL) 50 MG tablet Take 50 mg by mouth at bedtime.   Yes [provider]  Vitamin D, Ergocalciferol, (DRISDOL) 50000 UNITS CAPS capsule Take 50,000 Units by mouth every 7 (seven) days.   Yes [provider]    Family History  Problem Relation Age  of Onset  . Hypertension Father   . Diabetes Father      Social History  Substance Use Topics  . Smoking status: Never Smoker  . Smokeless tobacco: Never Used  . Alcohol use No    Allergies as of 09/15/2017  . (No Known Allergies)    Review of Systems:    All systems reviewed and negative except where noted in HPI.   Physical Exam:  BP (!) 141/87   Temp 98 F (36.7 C) (Oral)   Ht 5\' 8"  (1.727 m)   Wt 220 lb 12.8 oz (100.2 kg)   BMI 33.57 kg/m  No LMP for male patient. Psych:  Alert and cooperative. Normal mood and affect. General:   Alert,  Well-developed, well-nourished, pleasant and cooperative in NAD Head:  Normocephalic and atraumatic. Eyes:  Sclera clear, no icterus.   Conjunctiva pink. Ears:  Normal auditory acuity. Nose:  No deformity, discharge, or lesions. Mouth:  No deformity or lesions,oropharynx pink & moist. Neck:  Supple; no masses or thyromegaly. Lungs:  Respirations even and unlabored.  Clear throughout to auscultation.   No wheezes, crackles, or rhonchi. No acute distress. Heart:  Regular rate and rhythm; no murmurs, clicks, rubs, or gallops. Abdomen:  Normal bowel sounds.  No bruits.  Soft, non-tender and non-distended without masses, hepatosplenomegaly or hernias noted.  No guarding or rebound tenderness.    Neurologic:  Alert and oriented x3;  grossly normal neurologically. Skin:  Intact without significant lesions or rashes. No jaundice. Lymph Nodes:  No significant cervical adenopathy. Psych:  Alert and cooperative. Normal mood and affect.  Imaging Studies: No results found.  Assessment and Plan:   Larry Riggs is a 61 y.o. y/o male has been referred for dysphagia. His history is suggestive of esophageal dysphagia,probably a stricture   Plan  1. EGD +/- dilation  2 . Plavix holding instruction from cardiologist  3. Continue PPI 4. Copy of my note given to patients carer who is with him    I have discussed alternative options, risks &  benefits,  which include, but are not limited to, bleeding, infection, perforation,respiratory complication & drug reaction.  The patient agrees with this plan & written consent will be obtained.    Follow up PRN  Dr Wyline MoodKiran Inette Doubrava MD,MRCP(U.K)

## 2017-09-15 NOTE — Addendum Note (Signed)
Addended by: Ardyth ManARTER, Nilson Tabora Z on: 09/15/2017 10:13 AM   Modules accepted: Orders, SmartSet

## 2017-09-17 ENCOUNTER — Telehealth: Payer: Self-pay

## 2017-09-17 NOTE — Telephone Encounter (Signed)
Received clearance from Dr. Dario GuardianJadali for PLAVIX  Stop: 11/3  Restart: 11/9  Advised patient and patient's caregiver about medication clearance. Also, [facility]

## 2017-09-20 ENCOUNTER — Ambulatory Visit (INDEPENDENT_AMBULATORY_CARE_PROVIDER_SITE_OTHER): Payer: Medicare Other | Admitting: Podiatry

## 2017-09-20 DIAGNOSIS — D689 Coagulation defect, unspecified: Secondary | ICD-10-CM

## 2017-09-20 DIAGNOSIS — M79676 Pain in unspecified toe(s): Secondary | ICD-10-CM | POA: Diagnosis not present

## 2017-09-20 DIAGNOSIS — B351 Tinea unguium: Secondary | ICD-10-CM

## 2017-09-20 DIAGNOSIS — E119 Type 2 diabetes mellitus without complications: Secondary | ICD-10-CM

## 2017-09-20 NOTE — Progress Notes (Signed)
Complaint:  Visit Type: Patient returns to my office for continued preventative foot care services. Complaint: Patient states" my nails have grown long and thick and become painful to walk and wear shoes" Patient has been diagnosed with DM with no foot complications. The patient presents for preventative foot care services. No changes to ROS.  Patient is taking plavix.  Podiatric Exam: Vascular: dorsalis pedis and posterior tibial pulses are palpable bilateral. Capillary return is immediate. Temperature gradient is WNL. Skin turgor WNL  Sensorium: Normal Semmes Weinstein monofilament test. Normal tactile sensation bilaterally. Nail Exam: Pt has thick disfigured discolored nails with subungual debris noted bilateral entire nail hallux through fifth toenails Ulcer Exam: There is no evidence of ulcer or pre-ulcerative changes or infection. Orthopedic Exam: Muscle tone and strength are WNL. No limitations in general ROM. No crepitus or effusions noted. Foot type and digits show no abnormalities. Bony prominences are unremarkable. Skin: No Porokeratosis. No infection or ulcers  Diagnosis:  Onychomycosis, , Pain in right toe, pain in left toes  Treatment & Plan Procedures and Treatment: Consent by patient was obtained for treatment procedures. The patient understood the discussion of treatment and procedures well. All questions were answered thoroughly reviewed. Debridement of mycotic and hypertrophic toenails, 1 through 5 bilateral and clearing of subungual debris. No ulceration, no infection noted. ABN signed for 2018. Return Visit-Office Procedure: Patient instructed to return to the office for a follow up visit 3 months for continued evaluation and treatment.    Helane GuntherGregory Sanye Ledesma DPM

## 2017-09-23 ENCOUNTER — Ambulatory Visit
Admission: RE | Admit: 2017-09-23 | Discharge: 2017-09-23 | Disposition: A | Discharge status: Home / Self Care | Payer: Medicare Other | Source: Ambulatory Visit | Attending: Gastroenterology | Admitting: Gastroenterology

## 2017-09-23 ENCOUNTER — Ambulatory Visit: Payer: Medicare Other | Admitting: Anesthesiology

## 2017-09-23 ENCOUNTER — Encounter
Admission: RE | Disposition: A | Discharge status: Home / Self Care | Payer: Self-pay | Source: Ambulatory Visit | Attending: Gastroenterology

## 2017-09-23 DIAGNOSIS — J45909 Unspecified asthma, uncomplicated: Secondary | ICD-10-CM | POA: Diagnosis not present

## 2017-09-23 DIAGNOSIS — Z7982 Long term (current) use of aspirin: Secondary | ICD-10-CM | POA: Insufficient documentation

## 2017-09-23 DIAGNOSIS — Z951 Presence of aortocoronary bypass graft: Secondary | ICD-10-CM | POA: Diagnosis not present

## 2017-09-23 DIAGNOSIS — I252 Old myocardial infarction: Secondary | ICD-10-CM | POA: Insufficient documentation

## 2017-09-23 DIAGNOSIS — E785 Hyperlipidemia, unspecified: Secondary | ICD-10-CM | POA: Insufficient documentation

## 2017-09-23 DIAGNOSIS — I2510 Atherosclerotic heart disease of native coronary artery without angina pectoris: Secondary | ICD-10-CM | POA: Insufficient documentation

## 2017-09-23 DIAGNOSIS — Z6833 Body mass index (BMI) 33.0-33.9, adult: Secondary | ICD-10-CM | POA: Diagnosis not present

## 2017-09-23 DIAGNOSIS — Z7984 Long term (current) use of oral hypoglycemic drugs: Secondary | ICD-10-CM | POA: Diagnosis not present

## 2017-09-23 DIAGNOSIS — Z955 Presence of coronary angioplasty implant and graft: Secondary | ICD-10-CM | POA: Diagnosis not present

## 2017-09-23 DIAGNOSIS — Z79899 Other long term (current) drug therapy: Secondary | ICD-10-CM | POA: Diagnosis not present

## 2017-09-23 DIAGNOSIS — F70 Mild intellectual disabilities: Secondary | ICD-10-CM | POA: Diagnosis not present

## 2017-09-23 DIAGNOSIS — K222 Esophageal obstruction: Secondary | ICD-10-CM | POA: Diagnosis not present

## 2017-09-23 DIAGNOSIS — I10 Essential (primary) hypertension: Secondary | ICD-10-CM | POA: Diagnosis not present

## 2017-09-23 DIAGNOSIS — E669 Obesity, unspecified: Secondary | ICD-10-CM | POA: Insufficient documentation

## 2017-09-23 DIAGNOSIS — R1310 Dysphagia, unspecified: Secondary | ICD-10-CM

## 2017-09-23 DIAGNOSIS — Z7902 Long term (current) use of antithrombotics/antiplatelets: Secondary | ICD-10-CM | POA: Insufficient documentation

## 2017-09-23 DIAGNOSIS — E119 Type 2 diabetes mellitus without complications: Secondary | ICD-10-CM | POA: Insufficient documentation

## 2017-09-23 DIAGNOSIS — K219 Gastro-esophageal reflux disease without esophagitis: Secondary | ICD-10-CM | POA: Insufficient documentation

## 2017-09-23 DIAGNOSIS — R1319 Other dysphagia: Secondary | ICD-10-CM

## 2017-09-23 DIAGNOSIS — R1314 Dysphagia, pharyngoesophageal phase: Secondary | ICD-10-CM | POA: Diagnosis present

## 2017-09-23 HISTORY — PX: ESOPHAGOGASTRODUODENOSCOPY (EGD) WITH PROPOFOL: SHX5813

## 2017-09-23 LAB — GLUCOSE, CAPILLARY: Glucose-Capillary: 112 mg/dL — ABNORMAL HIGH (ref 65–99)

## 2017-09-23 SURGERY — ESOPHAGOGASTRODUODENOSCOPY (EGD) WITH PROPOFOL
Anesthesia: General

## 2017-09-23 MED ORDER — GLYCOPYRROLATE 0.2 MG/ML IJ SOLN
INTRAMUSCULAR | Status: DC | PRN
  Administered 2017-09-23: 0.2 mg via INTRAVENOUS

## 2017-09-23 MED ORDER — SODIUM CHLORIDE 0.9 % IV SOLN
INTRAVENOUS | Status: DC
  Administered 2017-09-23: 1000 mL via INTRAVENOUS

## 2017-09-23 MED ORDER — FENTANYL CITRATE (PF) 100 MCG/2ML IJ SOLN
INTRAMUSCULAR | Status: AC
  Filled 2017-09-23: qty 2

## 2017-09-23 MED ORDER — MIDAZOLAM HCL 2 MG/2ML IJ SOLN
INTRAMUSCULAR | Status: AC
  Filled 2017-09-23: qty 2

## 2017-09-23 MED ORDER — LIDOCAINE HCL (CARDIAC) 20 MG/ML IV SOLN
INTRAVENOUS | Status: DC | PRN
  Administered 2017-09-23: 60 mg via INTRAVENOUS

## 2017-09-23 MED ORDER — PROPOFOL 10 MG/ML IV BOLUS
INTRAVENOUS | Status: DC | PRN
  Administered 2017-09-23: 70 mg via INTRAVENOUS

## 2017-09-23 MED ORDER — FENTANYL CITRATE (PF) 100 MCG/2ML IJ SOLN
INTRAMUSCULAR | Status: DC | PRN
  Administered 2017-09-23: 25 ug via INTRAVENOUS

## 2017-09-23 MED ORDER — PROPOFOL 500 MG/50ML IV EMUL
INTRAVENOUS | Status: DC | PRN
  Administered 2017-09-23: 140 ug/kg/min via INTRAVENOUS

## 2017-09-23 MED ORDER — OMEPRAZOLE 40 MG PO CPDR: 40.0000 mg | DELAYED_RELEASE_CAPSULE | Freq: Every day | ORAL | 1 refills | Status: DC

## 2017-09-23 MED ORDER — MIDAZOLAM HCL 2 MG/2ML IJ SOLN
INTRAMUSCULAR | Status: DC | PRN
  Administered 2017-09-23: 1 mg via INTRAVENOUS

## 2017-09-23 NOTE — Anesthesia Postprocedure Evaluation (Signed)
Anesthesia Post Note  Patient: Larry Riggs  Procedure(s) Performed: ESOPHAGOGASTRODUODENOSCOPY (EGD) WITH PROPOFOL WITH DILATION (N/A )  Patient location during evaluation: Endoscopy Anesthesia Type: General Level of consciousness: awake and alert Pain management: pain level controlled Vital Signs Assessment: post-procedure vital signs reviewed and stable Respiratory status: spontaneous breathing and respiratory function stable Cardiovascular status: stable Anesthetic complications: no     Last Vitals:  Vitals:   09/23/17 0933 09/23/17 0943  BP: (!) 148/90 132/84  Pulse: 76 69  Resp: 12 18  Temp: (!) 36.1 C   SpO2: 97% 99%    Last Pain:  Vitals:   09/23/17 0943  TempSrc:   PainSc: 0-No pain                 Velvia Mehrer K

## 2017-09-23 NOTE — Anesthesia Post-op Follow-up Note (Signed)
Anesthesia QCDR form completed.        

## 2017-09-23 NOTE — Anesthesia Preprocedure Evaluation (Signed)
Anesthesia Evaluation  Patient identified by MRN, date of birth, ID band Patient awake    Reviewed: Allergy & Precautions, NPO status , Patient's Chart, lab work & pertinent test results  History of Anesthesia Complications Negative for: history of anesthetic complications  Airway Mallampati: III       Dental   Pulmonary asthma (no inhalers for 3 yrs) ,           Cardiovascular hypertension, Pt. on medications and Pt. on home beta blockers + CAD, + Past MI and + Cardiac Stents  (-) CHF (-) dysrhythmias (-) Valvular Problems/Murmurs     Neuro/Psych Seizures -, Well Controlled,  PSYCHIATRIC DISORDERS (mild mental retardation)    GI/Hepatic Neg liver ROS, GERD  Medicated and Controlled,  Endo/Other  diabetes, Type 2, Oral Hypoglycemic Agents  Renal/GU negative Renal ROS     Musculoskeletal   Abdominal   Peds  Hematology   Anesthesia Other Findings   Reproductive/Obstetrics                             Anesthesia Physical Anesthesia Plan  ASA: III  Anesthesia Plan: General   Post-op Pain Management:    Induction: Intravenous  PONV Risk Score and Plan: Propofol infusion  Airway Management Planned: Nasal Cannula  Additional Equipment:   Intra-op Plan:   Post-operative Plan:   Informed Consent: I have reviewed the patients History and Physical, chart, labs and discussed the procedure including the risks, benefits and alternatives for the proposed anesthesia with the patient or authorized representative who has indicated his/her understanding and acceptance.     Plan Discussed with:   Anesthesia Plan Comments:         Anesthesia Quick Evaluation

## 2017-09-23 NOTE — H&P (Signed)
Wyline MoodKiran Odel Schmid, MD [address], [address], [place], [place], [address] [address], [address], [place], [place], [address] Phone: [phone]  Fax: [phone]  Primary Care Physician:  Sherrie MustacheJadali, Fayegh, MD   Pre-Procedure History & Physical: HPI:  Larry HeinzKent H Westhoff is a 61 y.o. male is here for an endoscopy    Past Medical History:  Diagnosis Date  . Asthma   . Coronary atherosclerosis of native coronary vessel    a. 08/2013 Cath: signif 3V dzs w EF of 55%, eval @ [facility] for CABG but felt that there was no good target in the LAD;  b. 12/2013 Cath/PCI: LM nl, LAD 7862m (PTCA), 95d (attempted PTCA), subtl distal, D1 95 (2.75x16 Promus DES), D2 90ost, D3 80-90ost, D4 60ost, LCX 90p, small, RCA 207m, PDA nl, RPL1 60, RPL2 90ost.  . Dysphagia   . GERD (gastroesophageal reflux disease)   . Hyperlipidemia   . Hypertension   . Mildly mentally retarded   . Obesity   . Type II diabetes mellitus (HCC)     Past Surgical History:  Procedure Laterality Date  . CARDIAC CATHETERIZATION  08/2013   [facility]  . CORONARY ANGIOPLASTY WITH STENT PLACEMENT  12/27/2013   "1"  . INGUINAL HERNIA REPAIR Right 1990's    Prior to Admission medications   Medication Sig Start Date End Date Taking? Authorizing Provider  amLODipine (NORVASC) 2.5 MG tablet Take 1 tablet (2.5 mg total) by mouth daily. 11/19/15  Yes Iran OuchArida, Muhammad A, MD  aspirin 81 MG tablet Take 81 mg by mouth daily.   Yes [provider]  atorvastatin (LIPITOR) 80 MG tablet Take 1 tablet (80 mg total) by mouth daily. 03/13/16  Yes Elgergawy, Leana Roeawood S, MD  clopidogrel (PLAVIX) 75 MG tablet TAKE 1 TABLET DAILY 03/06/16  Yes Iran OuchArida, Muhammad A, MD  DULoxetine HCl 40 MG CPEP  08/18/17  Yes [provider]  glipiZIDE (GLUCOTROL XL) 5 MG 24 hr tablet Take 5 mg by mouth 2 (two) times daily.   Yes [provider]  hydrochlorothiazide (HYDRODIURIL) 25 MG tablet Take 25 mg by mouth daily.   Yes [provider]  JANUVIA 100 MG  tablet  08/05/17  Yes [provider]  lisinopril (PRINIVIL,ZESTRIL) 40 MG tablet Take 40 mg by mouth daily.   Yes [provider]  metFORMIN (GLUCOPHAGE) 1000 MG tablet Take 1 tablet (1,000 mg total) by mouth 2 (two) times daily with a meal. 12/28/13  Yes Ok AnisBerge, Christopher R, NP  metoprolol (LOPRESSOR) 50 MG tablet Take 50 mg by mouth 2 (two) times daily.   Yes [provider]  pantoprazole (PROTONIX) 40 MG tablet Take 40 mg by mouth daily.   Yes [provider]  risperidone (RISPERDAL) 4 MG tablet Take 4 mg by mouth daily.   Yes [provider]  tamsulosin (FLOMAX) 0.4 MG CAPS capsule  09/04/17  Yes [provider]  traZODone (DESYREL) 50 MG tablet Take 50 mg by mouth at bedtime.   Yes [provider]  Vitamin D, Ergocalciferol, (DRISDOL) 50000 UNITS CAPS capsule Take 50,000 Units by mouth every 7 (seven) days.   Yes [provider]    Allergies as of 09/15/2017  . (No Known Allergies)    Family History  Problem Relation Age of Onset  . Hypertension Father   . Diabetes Father     Social History   Socioeconomic History  . Marital status: Single    Spouse name: Not on file  . Number  of children: Not on file  . Years of education: Not on file  . Highest education level: Not on file  Social Needs  . Financial resource strain: Not on file  . Food insecurity - worry: Not on file  . Food insecurity - inability: Not on file  . Transportation needs - medical: Not on file  . Transportation needs - non-medical: Not on file  Occupational History  . Not on file  Tobacco Use  . Smoking status: Never Smoker  . Smokeless tobacco: Never Used  Substance and Sexual Activity  . Alcohol use: No  . Drug use: No  . Sexual activity: No  Other Topics Concern  . Not on file  Social History Narrative  . Not on file    Review of Systems: See HPI, otherwise negative ROS  Physical Exam: BP 133/78   Pulse 64   Temp (!)  96.9 F (36.1 C) (Tympanic)   Resp 18   Ht 5\' 8"  (1.727 m)   Wt 220 lb (99.8 kg)   SpO2 100%   BMI 33.45 kg/m  General:   Alert,  pleasant and cooperative in NAD Head:  Normocephalic and atraumatic. Neck:  Supple; no masses or thyromegaly. Lungs:  Clear throughout to auscultation, normal respiratory effort.    Heart:  +S1, +S2, Regular rate and rhythm, No edema. Abdomen:  Soft, nontender and nondistended. Normal bowel sounds, without guarding, and without rebound.   Neurologic:  Alert and  oriented x4;  grossly normal neurologically.  Impression/Plan: Larry Riggs is here for an endoscopy  to be performed for  evaluation of dysphagia    Risks, benefits, limitations, and alternatives regarding endoscopy and dilation have been reviewed with the patient.  Questions have been answered.  All parties agreeable.   Wyline MoodKiran Vint Pola, MD  09/23/2017, 9:04 AM

## 2017-09-23 NOTE — Transfer of Care (Signed)
Immediate Anesthesia Transfer of Care Note  Patient: Larry Riggs  Procedure(s) Performed: ESOPHAGOGASTRODUODENOSCOPY (EGD) WITH PROPOFOL WITH DILATION (N/A )  Patient Location: PACU  Anesthesia Type:General  Level of Consciousness: awake, alert  and oriented  Airway & Oxygen Therapy: Patient Spontanous Breathing and Patient connected to nasal cannula oxygen  Post-op Assessment: Report given to RN and Post -op Vital signs reviewed and stable  Post vital signs: Reviewed and stable  Last Vitals:  Vitals:   09/23/17 0932 09/23/17 0933  BP:  (!) 148/90  Pulse:  76  Resp:  12  Temp: (!) (P) 36.4 C (!) 36.1 C  SpO2:  97%    Last Pain:  Vitals:   09/23/17 0824  TempSrc: Tympanic         Complications: No apparent anesthesia complications

## 2017-09-23 NOTE — Op Note (Signed)
[facility] Gastroenterology Patient Name: Larry MangesKent Ryther Procedure Date: 09/23/2017 9:09 AM MRN: [id number] Account #: [id number] Date of Birth: [birth year] Admit Type: Outpatient Age: 61 [facility] ENDO ROOM 4 Gender: Male Note Status: Finalized Procedure:            Upper GI endoscopy Indications:          Dysphagia Providers:            Wyline MoodKiran Davaughn Hillyard MD, MD Referring MD:         Sherrie MustacheFayegh Jadali, MD (Referring MD) Medicines:            Monitored Anesthesia Care Complications:        No immediate complications. Procedure:            Pre-Anesthesia Assessment:                       - Prior to the procedure, a History and Physical was                        performed, and patient medications, allergies and                        sensitivities were reviewed. The patient's tolerance of                        previous anesthesia was reviewed.                       - The risks and benefits of the procedure and the                        sedation options and risks were discussed with the                        patient. All questions were answered and informed                        consent was obtained.                       - ASA Grade Assessment: III - A patient with severe                        systemic disease.                       After obtaining informed consent, the endoscope was                        passed under direct vision. Throughout the procedure,                        the patient's blood pressure, pulse, and oxygen                        saturations were monitored continuously. The Endoscope                        was introduced through the mouth, and advanced to the  third part of duodenum. The upper GI endoscopy was                        accomplished with ease. The patient tolerated the                        procedure well. Findings:      One severe (stenosis; an endoscope cannot pass) benign-appearing,       intrinsic stenosis  was found 25 to 26 cm from the incisors. This       measured 9 mm (inner diameter) x 1 cm (in length) and was traversed       after downsizing scope and dilating. A TTS dilator was passed through       the scope. Dilation with an 06-24-09 mm balloon and a 08-27-11 mm balloon       dilator was performed to 12 mm. The dilation site was examined following       endoscope reinsertion and showed moderate improvement in luminal       narrowing. This was biopsied with a cold forceps for evaluation of       eosinophilic esophagitis.      The esophagus was normal.      The examined duodenum was normal. Impression:           - Benign-appearing esophageal stenosis. Dilated.                        Biopsied.                       - Normal esophagus.                       - Normal examined duodenum. Recommendation:       - Discharge patient to home (with escort).                       - Resume previous diet.                       - Continue present medications.                       - Await pathology results.                       - If the biopsies suggest EOE then will need repeat                        dilation after a course of steroids otherwise will plan                        to re dilate in 2 weeks Procedure Code(s):    --- Professional ---                       [phone], Esophagogastroduodenoscopy, flexible, transoral;                        with transendoscopic balloon dilation of esophagus                        (less than 30 mm diameter)  [phone], Esophagogastroduodenoscopy, flexible, transoral;                        with biopsy, single or multiple Diagnosis Code(s):    --- Professional ---                       K22.2, Esophageal obstruction                       R13.10, Dysphagia, unspecified CPT copyright 2016 American Medical Association. All rights reserved. The codes documented in this report are preliminary and upon coder review may  be revised to meet current  compliance requirements. Wyline MoodKiran Jashay Roddy, MD Wyline MoodKiran Celina Shiley MD, MD 09/23/2017 9:28:32 AM This report has been signed electronically. Number of Addenda: 0 Note Initiated On: 09/23/2017 9:09 AM      [facility]

## 2017-09-24 ENCOUNTER — Encounter: Payer: Self-pay | Admitting: Gastroenterology

## 2017-09-24 LAB — SURGICAL PATHOLOGY

## 2017-09-27 ENCOUNTER — Telehealth: Payer: Self-pay

## 2017-09-27 NOTE — Telephone Encounter (Signed)
Attempted to contact patient.   No voicemail.   Results per Dr. Tobi BastosAnna: Inform needs to see me in office either this week of next to discuss treatment and repeat dilation

## 2017-09-27 NOTE — Telephone Encounter (Signed)
-----   Message from Wyline MoodKiran Anna, MD sent at 09/27/2017  8:53 AM EST ----- Inform needs to see me in office either this week of next to discuss treatment and repeat dilation

## 2017-09-30 ENCOUNTER — Encounter: Payer: Self-pay | Admitting: Gastroenterology

## 2017-09-30 ENCOUNTER — Ambulatory Visit (INDEPENDENT_AMBULATORY_CARE_PROVIDER_SITE_OTHER): Payer: Medicare Other | Admitting: Gastroenterology

## 2017-09-30 VITALS — BP 113/72 | HR 77 | Temp 97.8°F | Ht 68.0 in | Wt 227.2 lb

## 2017-09-30 DIAGNOSIS — K200 Eosinophilic esophagitis: Secondary | ICD-10-CM | POA: Diagnosis not present

## 2017-09-30 NOTE — Progress Notes (Signed)
Wyline MoodKiran Lyliana Dicenso MD, MRCP(U.K) [address]  [address]  [place], [place] [address]  Main: [phone]  Fax: [phone]   Primary Care Physician: Sherrie MustacheJadali, Fayegh, MD  Primary Gastroenterologist:  Dr. Wyline MoodKiran Farid Grigorian   Chief Complaint  Patient presents with  . Follow-up    HPI: Larry Riggs is a 61 y.o. male    Summary of history : He was initially referred and seen on 09/15/17 for dysphagia .Affected solids , h/o asthma. No weight loss. On Protonix He is on Plavix.   Interval history   09/15/2017-  09/30/2017   EGD :  One severe (stenosis; an endoscope cannot pass) benign-appearing, intrinsic stenosis was found 25 to 26 cm from the incisors. This measured 9 mm (inner diameter) x 1 cm (in length) and was traversed after downsizing scope and dilating. Dilated with an 06-24-09 mm balloon and a 08-27-11 mm balloon dilator was performed to 12 mm.The dilation site was examined following endoscope reinsertion and showed moderate improvement. Biopsies were taken and showed  upto 10 eosinophils phpf. 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   Since the dilation he says he has been doing much better. I explained he likely has eosinophilic esophagitis .   Current Outpatient Medications  Medication Sig Dispense Refill  . ACCU-CHEK SOFTCLIX LANCETS lancets     . amLODipine (NORVASC) 2.5 MG tablet Take 1 tablet (2.5 mg total) by mouth daily. 90 tablet 3  . atorvastatin (LIPITOR) 80 MG tablet Take 1 tablet (80 mg total) by mouth daily. 30 tablet 3  . clopidogrel (PLAVIX) 75 MG tablet TAKE 1 TABLET DAILY 90 tablet 3  . DULoxetine HCl 40 MG CPEP     . glipiZIDE (GLUCOTROL XL) 5 MG 24 hr tablet Take 5 mg by mouth 2 (two) times daily.    . hydrochlorothiazide (HYDRODIURIL) 25 MG tablet Take 25 mg by mouth daily.    Marland Kitchen. JANUVIA 100 MG tablet     . lisinopril (PRINIVIL,ZESTRIL) 40 MG tablet Take 40 mg by mouth daily.    . metFORMIN (GLUCOPHAGE) 1000 MG  tablet Take 1 tablet (1,000 mg total) by mouth 2 (two) times daily with a meal.    . METFORMIN HCL PO Take by mouth.    . metoprolol (LOPRESSOR) 50 MG tablet Take 50 mg by mouth 2 (two) times daily.    . pantoprazole (PROTONIX) 40 MG tablet Take 40 mg by mouth daily.    . risperidone (RISPERDAL) 4 MG tablet Take 4 mg by mouth daily.    . tamsulosin (FLOMAX) 0.4 MG CAPS capsule     . traZODone (DESYREL) 50 MG tablet Take 50 mg by mouth at bedtime.    . Vitamin D, Ergocalciferol, (DRISDOL) 50000 UNITS CAPS capsule Take 50,000 Units by mouth every 7 (seven) days.    Marland Kitchen. albuterol (PROAIR HFA) 108 (90 Base) MCG/ACT inhaler Inhale into the lungs.    Marland Kitchen. aspirin 81 MG tablet Take 81 mg by mouth daily.    . metoprolol succinate (TOPROL-XL) 50 MG 24 hr tablet Take by mouth.    Marland Kitchen. omeprazole (PRILOSEC) 40 MG capsule Take 1 capsule (40 mg total) daily by mouth. (Patient not taking: Reported on 09/30/2017) 30 capsule 1   No current facility-administered medications for this visit.     Allergies as of 09/30/2017  . (No Known Allergies)    ROS:  General: Negative for anorexia, weight loss, fever, chills, fatigue, weakness. ENT: Negative for hoarseness, difficulty swallowing , nasal  congestion. CV: Negative for chest pain, angina, palpitations, dyspnea on exertion, peripheral edema.  Respiratory: Negative for dyspnea at rest, dyspnea on exertion, cough, sputum, wheezing.  GI: See history of present illness. GU:  Negative for dysuria, hematuria, urinary incontinence, urinary frequency, nocturnal urination.  Endo: Negative for unusual weight change.    Physical Examination:   BP 113/72   Pulse 77   Temp 97.8 F (36.6 C) (Oral)   Ht 5\' 8"  (1.727 m)   Wt 227 lb 3.2 oz (103.1 kg)   BMI 34.55 kg/m   General: Well-nourished, well-developed in no acute distress.  Eyes: No icterus. Conjunctivae pink. Mouth: Oropharyngeal mucosa moist and pink , no lesions erythema or exudate. Lungs: Clear to  auscultation bilaterally. Non-labored. Heart: Regular rate and rhythm, no murmurs rubs or gallops.  Abdomen: Bowel sounds are normal, nontender, nondistended, no hepatosplenomegaly or masses, no abdominal bruits or hernia , no rebound or guarding.   Extremities: No lower extremity edema. No clubbing or deformities. Neuro: Alert and oriented x 3.  Grossly intact. Skin: Warm and dry, no jaundice.   Psych: Alert and cooperative, normal mood and affect.   Imaging Studies: No results found.  Assessment and Plan:   Larry HeinzKent H Riggs is a 61 y.o. y/o male witheosinophilic esophagitis. Endoscopically and on biopsy were suggestive. He failed PPI treatment. Stricture dilated but still needs further dilation after a course of Fluticasone for 6 weeks . He will be advised to obtain the inhaler , come into the office and be taught how to use it. Copy of my note will be given to the caregiver. Plavix holding instructions prior to EGD.    I have discussed alternative options, risks & benefits,  which include, but are not limited to, bleeding, infection, perforation,respiratory complication & drug reaction.  The patient agrees with this plan & written consent will be obtained.    Dr Wyline MoodKiran Meric Joye  MD,MRCP [facility](U.K) Follow up in 3 months

## 2017-10-01 ENCOUNTER — Other Ambulatory Visit: Payer: Self-pay

## 2017-10-01 DIAGNOSIS — K200 Eosinophilic esophagitis: Secondary | ICD-10-CM

## 2017-10-01 MED ORDER — FLUTICASONE PROPIONATE HFA 220 MCG/ACT IN AERO: INHALATION_SPRAY | RESPIRATORY_TRACT | 0 refills | Status: DC

## 2017-10-06 ENCOUNTER — Telehealth: Payer: Self-pay | Admitting: Gastroenterology

## 2017-10-06 NOTE — Telephone Encounter (Signed)
Larry Riggs from the home has a medical question to ask regarding patient. Please call.

## 2017-10-15 ENCOUNTER — Telehealth: Payer: Self-pay | Admitting: Gastroenterology

## 2017-10-15 NOTE — Telephone Encounter (Signed)
*  STAT* If patient is at the pharmacy, call can be transferred to refill team.   1. Which medications need to be refilled? (please list name of each medication and dose if known) flovent 220 mcg  2. Which pharmacy/location (including street and city if local pharmacy) is medication to be sent to? [facility]  3. Do they need a 30 day or 90 day supply?

## 2017-10-15 NOTE — Telephone Encounter (Signed)
Contacted pt's caregiver. Caregiver states patient has not received meds from [facility] due to sig concern.   Called directly and reordered inhaler for patient.

## 2017-11-17 ENCOUNTER — Other Ambulatory Visit: Payer: Self-pay

## 2017-12-02 ENCOUNTER — Other Ambulatory Visit: Payer: Self-pay

## 2017-12-02 MED ORDER — OMEPRAZOLE 40 MG PO CPDR: 40.0000 mg | DELAYED_RELEASE_CAPSULE | Freq: Every day | ORAL | 1 refills | Status: DC

## 2017-12-20 ENCOUNTER — Telehealth: Payer: Self-pay | Admitting: Gastroenterology

## 2017-12-20 ENCOUNTER — Telehealth: Payer: Self-pay

## 2017-12-20 NOTE — Telephone Encounter (Signed)
Please call Blake DivineRobin Johnson regarding patient @ [phone]

## 2017-12-20 NOTE — Telephone Encounter (Signed)
LVM for Larry Riggs to callback concerning Mr. Adrian BlackwaterStinson. Unable to contact patient or his caretaker concerning follow-up.

## 2017-12-21 ENCOUNTER — Other Ambulatory Visit: Payer: Self-pay

## 2017-12-21 DIAGNOSIS — R1319 Other dysphagia: Secondary | ICD-10-CM

## 2017-12-21 DIAGNOSIS — R1310 Dysphagia, unspecified: Secondary | ICD-10-CM

## 2017-12-21 NOTE — Progress Notes (Signed)
Advised careworker Ms. Johnson.

## 2017-12-21 NOTE — Telephone Encounter (Signed)
Returned caregivers call per note:  Please call Blake DivineRobin Johnson regarding patient @ [phone]  Patient needs to schedule EGD with dilation if medication treatment has been completed.   Clearance has been received from Dr. Dario GuardianJadali: 5 days prior, 1 day after.

## 2017-12-23 ENCOUNTER — Ambulatory Visit: Payer: Medicare Other | Admitting: Podiatry

## 2017-12-28 ENCOUNTER — Ambulatory Visit
Admission: RE | Admit: 2017-12-28 | Discharge: 2017-12-28 | Disposition: A | Discharge status: Home / Self Care | Payer: Medicare Other | Source: Ambulatory Visit | Attending: Gastroenterology | Admitting: Gastroenterology

## 2017-12-28 ENCOUNTER — Ambulatory Visit: Payer: Medicare Other | Admitting: Anesthesiology

## 2017-12-28 ENCOUNTER — Encounter
Admission: RE | Disposition: A | Discharge status: Home / Self Care | Payer: Self-pay | Source: Ambulatory Visit | Attending: Gastroenterology

## 2017-12-28 ENCOUNTER — Encounter: Payer: Self-pay | Admitting: Certified Registered Nurse Anesthetist

## 2017-12-28 DIAGNOSIS — R1319 Other dysphagia: Secondary | ICD-10-CM

## 2017-12-28 DIAGNOSIS — F70 Mild intellectual disabilities: Secondary | ICD-10-CM | POA: Diagnosis not present

## 2017-12-28 DIAGNOSIS — K200 Eosinophilic esophagitis: Secondary | ICD-10-CM | POA: Insufficient documentation

## 2017-12-28 DIAGNOSIS — E785 Hyperlipidemia, unspecified: Secondary | ICD-10-CM | POA: Diagnosis not present

## 2017-12-28 DIAGNOSIS — K449 Diaphragmatic hernia without obstruction or gangrene: Secondary | ICD-10-CM | POA: Insufficient documentation

## 2017-12-28 DIAGNOSIS — Z7984 Long term (current) use of oral hypoglycemic drugs: Secondary | ICD-10-CM | POA: Insufficient documentation

## 2017-12-28 DIAGNOSIS — Z6830 Body mass index (BMI) 30.0-30.9, adult: Secondary | ICD-10-CM | POA: Diagnosis not present

## 2017-12-28 DIAGNOSIS — Z79899 Other long term (current) drug therapy: Secondary | ICD-10-CM | POA: Diagnosis not present

## 2017-12-28 DIAGNOSIS — I2510 Atherosclerotic heart disease of native coronary artery without angina pectoris: Secondary | ICD-10-CM | POA: Diagnosis not present

## 2017-12-28 DIAGNOSIS — E669 Obesity, unspecified: Secondary | ICD-10-CM | POA: Insufficient documentation

## 2017-12-28 DIAGNOSIS — J449 Chronic obstructive pulmonary disease, unspecified: Secondary | ICD-10-CM | POA: Diagnosis not present

## 2017-12-28 DIAGNOSIS — R1310 Dysphagia, unspecified: Secondary | ICD-10-CM

## 2017-12-28 DIAGNOSIS — I10 Essential (primary) hypertension: Secondary | ICD-10-CM | POA: Diagnosis not present

## 2017-12-28 DIAGNOSIS — Z09 Encounter for follow-up examination after completed treatment for conditions other than malignant neoplasm: Secondary | ICD-10-CM | POA: Diagnosis not present

## 2017-12-28 DIAGNOSIS — Z951 Presence of aortocoronary bypass graft: Secondary | ICD-10-CM | POA: Diagnosis not present

## 2017-12-28 DIAGNOSIS — E119 Type 2 diabetes mellitus without complications: Secondary | ICD-10-CM | POA: Insufficient documentation

## 2017-12-28 HISTORY — PX: ESOPHAGOGASTRODUODENOSCOPY (EGD) WITH PROPOFOL: SHX5813

## 2017-12-28 LAB — GLUCOSE, CAPILLARY: GLUCOSE-CAPILLARY: 174 mg/dL — AB (ref 65–99)

## 2017-12-28 SURGERY — ESOPHAGOGASTRODUODENOSCOPY (EGD) WITH PROPOFOL
Anesthesia: General

## 2017-12-28 MED ORDER — LIDOCAINE HCL (CARDIAC) 20 MG/ML IV SOLN
INTRAVENOUS | Status: DC | PRN
  Administered 2017-12-28: 50 mg via INTRATRACHEAL

## 2017-12-28 MED ORDER — IPRATROPIUM-ALBUTEROL 0.5-2.5 (3) MG/3ML IN SOLN
RESPIRATORY_TRACT | Status: AC
  Filled 2017-12-28: qty 3

## 2017-12-28 MED ORDER — IPRATROPIUM-ALBUTEROL 0.5-2.5 (3) MG/3ML IN SOLN
3.0000 mL | Freq: Once | RESPIRATORY_TRACT | Status: AC
  Administered 2017-12-28: 3 mL via RESPIRATORY_TRACT

## 2017-12-28 MED ORDER — PROPOFOL 10 MG/ML IV BOLUS
INTRAVENOUS | Status: DC | PRN
  Administered 2017-12-28: 180 mg via INTRAVENOUS

## 2017-12-28 MED ORDER — LIDOCAINE HCL (PF) 2 % IJ SOLN
INTRAMUSCULAR | Status: AC
  Filled 2017-12-28: qty 10

## 2017-12-28 MED ORDER — SODIUM CHLORIDE 0.9 % IV SOLN
INTRAVENOUS | Status: DC
  Administered 2017-12-28: 1000 mL via INTRAVENOUS

## 2017-12-28 MED ORDER — PROPOFOL 500 MG/50ML IV EMUL
INTRAVENOUS | Status: AC
  Filled 2017-12-28: qty 50

## 2017-12-28 NOTE — Anesthesia Postprocedure Evaluation (Signed)
Anesthesia Post Note  Patient: Larry Riggs  Procedure(s) Performed: ESOPHAGOGASTRODUODENOSCOPY (EGD) WITH PROPOFOL WITH DILATION (N/A )  Patient location during evaluation: Endoscopy Anesthesia Type: General Level of consciousness: awake and alert Pain management: pain level controlled Vital Signs Assessment: post-procedure vital signs reviewed and stable Respiratory status: spontaneous breathing, nonlabored ventilation, respiratory function stable and patient connected to nasal cannula oxygen Cardiovascular status: blood pressure returned to baseline and stable Postop Assessment: no apparent nausea or vomiting Anesthetic complications: no     Last Vitals:  Vitals:   12/28/17 0844 12/28/17 0854  BP: 114/78 113/77  Pulse:    Resp:    Temp:    SpO2:      Last Pain:  Vitals:   12/28/17 0824  TempSrc: Tympanic  PainSc: Asleep                 Cleda MccreedyJoseph K Kanden Carey

## 2017-12-28 NOTE — Transfer of Care (Signed)
Immediate Anesthesia Transfer of Care Note  Patient: Larry Riggs  Procedure(s) Performed: ESOPHAGOGASTRODUODENOSCOPY (EGD) WITH PROPOFOL WITH DILATION (N/A )  Patient Location: PACU and Endoscopy Unit  Anesthesia Type:General  Level of Consciousness: asleep  Airway & Oxygen Therapy: Patient Spontanous Breathing and Patient connected to nasal cannula oxygen  Post-op Assessment: Report given to RN and Post -op Vital signs reviewed and stable  Post vital signs: Reviewed and stable  Last Vitals:  Vitals:   12/28/17 0741 12/28/17 0824  BP: 122/89   Pulse: 72   Resp: 20   Temp: (!) 36.1 C 36.9 C  SpO2: 98%     Last Pain:  Vitals:   12/28/17 0824  TempSrc: Tympanic  PainSc: Asleep         Complications: No apparent anesthesia complications

## 2017-12-28 NOTE — Anesthesia Preprocedure Evaluation (Signed)
Anesthesia Evaluation  Patient identified by MRN, date of birth, ID band Patient awake    Reviewed: Allergy & Precautions, H&P , NPO status , Patient's Chart, lab work & pertinent test results  History of Anesthesia Complications Negative for: history of anesthetic complications  Airway Mallampati: III  TM Distance: >3 FB Neck ROM: full    Dental  (+) Chipped, Poor Dentition, Missing   Pulmonary neg shortness of breath, asthma , COPD,           Cardiovascular Exercise Tolerance: Good hypertension, (-) angina+ CAD and + Cardiac Stents  (-) DOE      Neuro/Psych Seizures -, Well Controlled,  PSYCHIATRIC DISORDERS    GI/Hepatic Neg liver ROS, GERD  Medicated and Controlled,  Endo/Other  diabetes, Type 2  Renal/GU negative Renal ROS  negative genitourinary   Musculoskeletal   Abdominal   Peds  Hematology negative hematology ROS (+)   Anesthesia Other Findings Past Medical History: No date: Asthma No date: Coronary atherosclerosis of native coronary vessel     Comment:  a. 08/2013 Cath: signif 3V dzs w EF of 55%, eval @ [facility]               for CABG but felt that there was no good target in the               LAD;  b. 12/2013 Cath/PCI: LM nl, LAD 641m (PTCA), 95d               (attempted PTCA), subtl distal, D1 95 (2.75x16 Promus               DES), D2 90ost, D3 80-90ost, D4 60ost, LCX 90p, small,               RCA 5874m, PDA nl, RPL1 60, RPL2 90ost. No date: Dysphagia No date: GERD (gastroesophageal reflux disease) No date: Hyperlipidemia No date: Hypertension No date: Mildly mentally retarded No date: Obesity No date: Type II diabetes mellitus (HCC)  Past Surgical History: 08/2013: CARDIAC CATHETERIZATION     Comment:  [facility] 12/27/2013: CORONARY ANGIOPLASTY WITH STENT PLACEMENT     Comment:  "1" 09/23/2017: ESOPHAGOGASTRODUODENOSCOPY (EGD) WITH PROPOFOL; N/A     Comment:  Procedure: ESOPHAGOGASTRODUODENOSCOPY (EGD)  WITH               PROPOFOL WITH DILATION;  Surgeon: [name], Kiran, MD;                [facility];  Service: Gastroenterology;                Laterality: N/A; 1990's: INGUINAL HERNIA REPAIR; Right 12/27/2013: PERCUTANEOUS CORONARY STENT INTERVENTION (PCI-S); N/A     Comment:  Procedure: PERCUTANEOUS CORONARY STENT INTERVENTION               (PCI-S);  Surgeon: Iran OuchMuhammad A Arida, MD;  [facility]               C[facility];  Service: Cardiovascular;  Laterality: N/A;  BMI    Body Mass Index:  30.41 kg/m      Reproductive/Obstetrics negative OB ROS                             Anesthesia Physical Anesthesia Plan  ASA: III  Anesthesia Plan: General   Post-op Pain Management:    Induction: Intravenous  PONV Risk Score and Plan: Propofol infusion and TIVA  Airway Management Planned: Natural  Airway and Nasal Cannula  Additional Equipment:   Intra-op Plan:   Post-operative Plan:   Informed Consent: I have reviewed the patients History and Physical, chart, labs and discussed the procedure including the risks, benefits and alternatives for the proposed anesthesia with the patient or authorized representative who has indicated his/her understanding and acceptance.   Dental Advisory Given  Plan Discussed with: Anesthesiologist, CRNA and Surgeon  Anesthesia Plan Comments: (Patient and care giver consented for risks of anesthesia including but not limited to:  - adverse reactions to medications - risk of intubation if required - damage to teeth, lips or other oral mucosa - sore throat or hoarseness - Damage to heart, brain, lungs or loss of life  They voiced understanding.)        Anesthesia Quick Evaluation

## 2017-12-28 NOTE — Op Note (Signed)
[facility] Gastroenterology Patient Name: Larry MangesKent Riggs Procedure Date: 12/28/2017 7:53 AM MRN: [id number] Account #: [id number] Date of Birth: 1956/04/02 Admit Type: Outpatient Age: [age] [facility] Gender: Male Note Status: Finalized Procedure:            Upper GI endoscopy Indications:          Follow-up of eosinophilic esophagitis Providers:            Wyline MoodKiran Jameon Deller MD, MD Referring MD:         Sherrie MustacheFayegh Jadali, MD (Referring MD) Medicines:            Monitored Anesthesia Care Complications:        No immediate complications. Procedure:            Pre-Anesthesia Assessment:                       - Prior to the procedure, a History and Physical was                        performed, and patient medications, allergies and                        sensitivities were reviewed. The patient's tolerance of                        previous anesthesia was reviewed.                       - The risks and benefits of the procedure and the                        sedation options and risks were discussed with the                        patient. All questions were answered and informed                        consent was obtained.                       - ASA Grade Assessment: III - A patient with severe                        systemic disease.                       After obtaining informed consent, the endoscope was                        passed under direct vision. Throughout the procedure,                        the patient's blood pressure, pulse, and oxygen                        saturations were monitored continuously. The Endoscope                        was introduced through the mouth, and advanced to the  third part of duodenum. The upper GI endoscopy was                        accomplished with ease. The patient tolerated the                        procedure well. Findings:      The examined duodenum was normal.      The esophagus was  normal.      A 5 cm hiatal hernia was present.      The cardia and gastric fundus were normal on retroflexion. Impression:           - Normal examined duodenum.                       - Normal esophagus.                       - 5 cm hiatal hernia.                       - No specimens collected. Recommendation:       - Discharge patient to home (with escort).                       - Resume previous diet.                       - Continue present medications.                       - Await pathology results.                       - Presently he has no dysphagia and just completed a                        course of Fluticasone, if dysphagia returns wil treat                        again with fluitcasone. No obvious stricture seen but                        they can form at any time with the condition he has                        eosinophilic esophagitis.                       - Return to my office PRN. Procedure Code(s):    --- Professional ---                       [phone], Esophagogastroduodenoscopy, flexible, transoral;                        diagnostic, including collection of specimen(s) by                        brushing or washing, when performed (separate procedure) Diagnosis Code(s):    --- Professional ---  K44.9, Diaphragmatic hernia without obstruction or                        gangrene                       K20.0, Eosinophilic esophagitis CPT copyright 2016 American Medical Association. All rights reserved. The codes documented in this report are preliminary and upon coder review may  be revised to meet current compliance requirements. Wyline Mood, MD Wyline Mood MD, MD 12/28/2017 8:21:47 AM This report has been signed electronically. Number of Addenda: 0 Note Initiated On: 12/28/2017 7:53 AM      [facility]

## 2017-12-28 NOTE — Anesthesia Post-op Follow-up Note (Signed)
Anesthesia QCDR form completed.        

## 2017-12-28 NOTE — H&P (Signed)
Wyline MoodKiran Amerigo Mcglory, MD [address], [address], [place], [place], [address] [address], [address], [place], [place], [address] Phone: [phone]  Fax: [phone]  Primary Care Physician:  Sherrie MustacheJadali, Fayegh, MD   Pre-Procedure History & Physical: HPI:  Larry Riggs is a 62 y.o. male is here for an endoscopy    Past Medical History:  Diagnosis Date  . Asthma   . Coronary atherosclerosis of native coronary vessel    a. 08/2013 Cath: signif 3V dzs w EF of 55%, eval @ [facility] for CABG but felt that there was no good target in the LAD;  b. 12/2013 Cath/PCI: LM nl, LAD 1973m (PTCA), 95d (attempted PTCA), subtl distal, D1 95 (2.75x16 Promus DES), D2 90ost, D3 80-90ost, D4 60ost, LCX 90p, small, RCA 6167m, PDA nl, RPL1 60, RPL2 90ost.  . Dysphagia   . GERD (gastroesophageal reflux disease)   . Hyperlipidemia   . Hypertension   . Mildly mentally retarded   . Obesity   . Type II diabetes mellitus (HCC)     Past Surgical History:  Procedure Laterality Date  . CARDIAC CATHETERIZATION  08/2013   [facility]  . CORONARY ANGIOPLASTY WITH STENT PLACEMENT  12/27/2013   "1"  . ESOPHAGOGASTRODUODENOSCOPY (EGD) WITH PROPOFOL N/A 09/23/2017   Procedure: ESOPHAGOGASTRODUODENOSCOPY (EGD) WITH PROPOFOL WITH DILATION;  Surgeon: [name], Brewer Hitchman, MD;  [facility];  Service: Gastroenterology;  Laterality: N/A;  . INGUINAL HERNIA REPAIR Right 1990's  . PERCUTANEOUS CORONARY STENT INTERVENTION (PCI-S) N/A 12/27/2013   Procedure: PERCUTANEOUS CORONARY STENT INTERVENTION (PCI-S);  Surgeon: Iran OuchMuhammad A Arida, MD;  [facility];  Service: Cardiovascular;  Laterality: N/A;    Prior to Admission medications   Medication Sig Start Date End Date Taking? Authorizing Provider  ACCU-CHEK SOFTCLIX LANCETS lancets  09/15/17  Yes [provider]  albuterol (PROAIR HFA) 108 (90 Base) MCG/ACT inhaler Inhale into the lungs.   Yes [provider]  amLODipine (NORVASC) 2.5 MG tablet Take 1 tablet (2.5  mg total) by mouth daily. 11/19/15  Yes Iran OuchArida, Muhammad A, MD  aspirin 81 MG tablet Take 81 mg by mouth daily.   Yes [provider]  atorvastatin (LIPITOR) 80 MG tablet Take 1 tablet (80 mg total) by mouth daily. 03/13/16  Yes Elgergawy, Leana Roeawood S, MD  clopidogrel (PLAVIX) 75 MG tablet TAKE 1 TABLET DAILY 03/06/16  Yes Iran OuchArida, Muhammad A, MD  DULoxetine HCl 40 MG CPEP  08/18/17  Yes [provider]  fluticasone (FLOVENT HFA) 220 MCG/ACT inhaler 2 puffs and swallow. Take two (2) times daily after breakfast and bedtime. Rinse mouth after each use. 10/01/17  Yes [name], Klaudia Beirne, MD  JANUVIA 100 MG tablet  08/05/17  Yes [provider]  lisinopril (PRINIVIL,ZESTRIL) 40 MG tablet Take 40 mg by mouth daily.   Yes [provider]  metFORMIN (GLUCOPHAGE) 1000 MG tablet Take 1 tablet (1,000 mg total) by mouth 2 (two) times daily with a meal. 12/28/13  Yes Creig HinesBerge, Christopher Ronald, NP  METFORMIN HCL PO Take by mouth.   Yes [provider]  metoprolol (LOPRESSOR) 50 MG tablet Take 50 mg by mouth 2 (two) times daily.   Yes [provider]  metoprolol succinate (TOPROL-XL) 50 MG 24 hr tablet Take by mouth.   Yes [provider]  omeprazole (PRILOSEC) 40 MG capsule Take 1 capsule (40 mg total) by mouth daily. 12/02/17 12/02/18 Yes [name], Cyntha Brickman, MD  pantoprazole (PROTONIX) 40 MG tablet Take 40 mg by mouth daily.  Yes [provider]  risperidone (RISPERDAL) 4 MG tablet Take 4 mg by mouth daily.   Yes [provider]  tamsulosin (FLOMAX) 0.4 MG CAPS capsule  09/04/17  Yes [provider]  traZODone (DESYREL) 50 MG tablet Take 50 mg by mouth at bedtime.   Yes [provider]  Vitamin D, Ergocalciferol, (DRISDOL) 50000 UNITS CAPS capsule Take 50,000 Units by mouth every 7 (seven) days.   Yes [provider]  glipiZIDE (GLUCOTROL XL) 5 MG 24 hr tablet Take 5 mg by mouth 2 (two) times daily.    [provider]    hydrochlorothiazide (HYDRODIURIL) 25 MG tablet Take 25 mg by mouth daily.    [provider]    Allergies as of 12/21/2017  . (No Known Allergies)    Family History  Problem Relation Age of Onset  . Hypertension Father   . Diabetes Father     Social History   Socioeconomic History  . Marital status: Single    Spouse name: Not on file  . Number of children: Not on file  . Years of education: Not on file  . Highest education level: Not on file  Social Needs  . Financial resource strain: Not on file  . Food insecurity - worry: Not on file  . Food insecurity - inability: Not on file  . Transportation needs - medical: Not on file  . Transportation needs - non-medical: Not on file  Occupational History  . Not on file  Tobacco Use  . Smoking status: Never Smoker  . Smokeless tobacco: Never Used  Substance and Sexual Activity  . Alcohol use: No  . Drug use: No  . Sexual activity: No  Other Topics Concern  . Not on file  Social History Narrative  . Not on file    Review of Systems: See HPI, otherwise negative ROS  Physical Exam: BP 122/89   Pulse 72   Temp (!) 97 F (36.1 C) (Tympanic)   Resp 20   Ht 5\' 8"  (1.727 m)   Wt 200 lb (90.7 kg)   SpO2 98%   BMI 30.41 kg/m  General:   Alert,  pleasant and cooperative in NAD Head:  Normocephalic and atraumatic. Neck:  Supple; no masses or thyromegaly. Lungs:  Clear throughout to auscultation, normal respiratory effort.    Heart:  +S1, +S2, Regular rate and rhythm, No edema. Abdomen:  Soft, nontender and nondistended. Normal bowel sounds, without guarding, and without rebound.   Neurologic:  Alert and  oriented x4;  grossly normal neurologically.  Impression/Plan: Larry Riggs is here for an endoscopy  to be performed for  evaluation of dysphagia    Risks, benefits, limitations, and alternatives regarding endoscopy and possible dilation  have been reviewed with the patient.  Questions have been answered.   All parties agreeable.   Wyline Mood, MD  12/28/2017, 8:10 AM

## 2017-12-29 ENCOUNTER — Encounter: Payer: Self-pay | Admitting: Gastroenterology

## 2018-01-03 ENCOUNTER — Ambulatory Visit (INDEPENDENT_AMBULATORY_CARE_PROVIDER_SITE_OTHER): Payer: Medicare Other | Admitting: Podiatry

## 2018-01-03 ENCOUNTER — Encounter: Payer: Self-pay | Admitting: Podiatry

## 2018-01-03 DIAGNOSIS — B351 Tinea unguium: Secondary | ICD-10-CM | POA: Diagnosis not present

## 2018-01-03 DIAGNOSIS — M79676 Pain in unspecified toe(s): Secondary | ICD-10-CM | POA: Diagnosis not present

## 2018-01-03 DIAGNOSIS — E119 Type 2 diabetes mellitus without complications: Secondary | ICD-10-CM

## 2018-01-03 DIAGNOSIS — D689 Coagulation defect, unspecified: Secondary | ICD-10-CM

## 2018-01-03 NOTE — Progress Notes (Signed)
Complaint:  Visit Type: Patient returns to my office for continued preventative foot care services. Complaint: Patient states" my nails have grown long and thick and become painful to walk and wear shoes" Patient has been diagnosed with DM with no foot complications. The patient presents for preventative foot care services. No changes to ROS.  Patient is taking plavix.  Podiatric Exam: Vascular: dorsalis pedis and posterior tibial pulses are palpable bilateral. Capillary return is immediate. Temperature gradient is WNL. Skin turgor WNL  Sensorium: Normal Semmes Weinstein monofilament test. Normal tactile sensation bilaterally. Nail Exam: Pt has thick disfigured discolored nails with subungual debris noted bilateral entire nail hallux through fifth toenails Ulcer Exam: There is no evidence of ulcer or pre-ulcerative changes or infection. Orthopedic Exam: Muscle tone and strength are WNL. No limitations in general ROM. No crepitus or effusions noted. Foot type and digits show no abnormalities. Bony prominences are unremarkable. Skin: No Porokeratosis. No infection or ulcers  Diagnosis:  Onychomycosis, , Pain in right toe, pain in left toes  Treatment & Plan Procedures and Treatment: Consent by patient was obtained for treatment procedures. The patient understood the discussion of treatment and procedures well. All questions were answered thoroughly reviewed. Debridement of mycotic and hypertrophic toenails, 1 through 5 bilateral and clearing of subungual debris. No ulceration, no infection noted. .ABN signed for 2019.  Return Visit-Office Procedure: Patient instructed to return to the office for a follow up visit 3 months for continued evaluation and treatment.    Marcha Licklider DPM 

## 2018-03-16 ENCOUNTER — Telehealth: Payer: Self-pay | Admitting: Gastroenterology

## 2018-03-16 NOTE — Telephone Encounter (Signed)
Caregiver left vm in regards to pt not being able to swallow his rx or liquids I attempted 4 diff phone numbers to call back  No vm set up to get scheduled

## 2018-03-17 ENCOUNTER — Telehealth: Payer: Self-pay | Admitting: Gastroenterology

## 2018-03-17 ENCOUNTER — Ambulatory Visit (INDEPENDENT_AMBULATORY_CARE_PROVIDER_SITE_OTHER): Payer: Medicare Other | Admitting: Gastroenterology

## 2018-03-17 ENCOUNTER — Other Ambulatory Visit: Payer: Self-pay

## 2018-03-17 ENCOUNTER — Encounter: Payer: Self-pay | Admitting: Gastroenterology

## 2018-03-17 VITALS — BP 129/84 | HR 79 | Ht 68.0 in | Wt 238.2 lb

## 2018-03-17 DIAGNOSIS — J301 Allergic rhinitis due to pollen: Secondary | ICD-10-CM

## 2018-03-17 DIAGNOSIS — K200 Eosinophilic esophagitis: Secondary | ICD-10-CM

## 2018-03-17 MED ORDER — FLUTICASONE PROPIONATE 50 MCG/ACT NA SUSP: 1.0000 | Freq: Every day | NASAL | 0 refills | Status: DC

## 2018-03-17 NOTE — Progress Notes (Signed)
Summary of history :   Wyline Mood MD, MRCP(U.K) [address]  [address]  [place], [place] [address]  Main: [phone]  Fax: [phone]   Primary Care Physician: Sherrie Mustache, MD  Primary Gastroenterologist:  Dr. Wyline Mood   Chief Complaint  Patient presents with  . Follow-up    esophagitis-takes longer to chew food, now pills have to crushed, hurts when he coughs    HPI: Larry Riggs is a 62 y.o. male   He was initially referred and seen on 09/15/17 for dysphagia .Affected solids , h/o asthma. No weight loss. On Protonix He is on Plavix.   EGD :  One severe (stenosis; an endoscope cannot pass) benign-appearing, intrinsic stenosis was found 25 to 26 cm from the incisors. This measured 9 mm (inner diameter) x 1 cm (in length) and was traversed after downsizing scope and dilating. Dilated with an 06-24-09 mm balloon and a 08-27-11 mm balloon dilator was performed to 12 mm.The dilation site was examined following endoscope reinsertion and showed moderate improvement. Biopsies were taken and showed  upto 10 eosinophils phpf. 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   Interval history  09/30/2017-03/2018   12/2017 EGD:  Normal except for hiatal hernia 5 cm.   Says still having issues with swallowing - was doing better on the inhaler , now that he has stopped it, has recurrence of symptoms . Got a few teeth extracted. Here with his aid. Gaining weight .    Current Outpatient Medications  Medication Sig Dispense Refill  . ACCU-CHEK SOFTCLIX LANCETS lancets     . albuterol (PROAIR HFA) 108 (90 Base) MCG/ACT inhaler Inhale into the lungs.    Marland Kitchen amLODipine (NORVASC) 2.5 MG tablet Take 1 tablet (2.5 mg total) by mouth daily. 90 tablet 3  . aspirin 81 MG EC tablet Take 81 mg by mouth.    Marland Kitchen aspirin 81 MG tablet Take 81 mg by mouth daily.    Marland Kitchen atorvastatin (LIPITOR) 80 MG tablet Take 1 tablet (80 mg total) by mouth daily. 30 tablet  3  . clopidogrel (PLAVIX) 75 MG tablet TAKE 1 TABLET DAILY 90 tablet 3  . DULoxetine HCl 40 MG CPEP     . fluticasone (FLOVENT HFA) 220 MCG/ACT inhaler 2 puffs and swallow. Take two (2) times daily after breakfast and bedtime. Rinse mouth after each use. 1 Inhaler 0  . glipiZIDE (GLUCOTROL XL) 5 MG 24 hr tablet Take 5 mg by mouth 2 (two) times daily.    . hydrochlorothiazide (HYDRODIURIL) 25 MG tablet Take 25 mg by mouth daily.    Marland Kitchen JANUVIA 100 MG tablet     . lisinopril (PRINIVIL,ZESTRIL) 40 MG tablet Take 40 mg by mouth daily.    . metFORMIN (GLUCOPHAGE) 1000 MG tablet Take 1 tablet (1,000 mg total) by mouth 2 (two) times daily with a meal.    . METFORMIN HCL PO Take by mouth.    . metoprolol (LOPRESSOR) 50 MG tablet Take 50 mg by mouth 2 (two) times daily.    . metoprolol succinate (TOPROL-XL) 50 MG 24 hr tablet Take by mouth.    . METOPROLOL TARTRATE PO Take 50 mg by mouth.    Marland Kitchen omeprazole (PRILOSEC) 40 MG capsule Take 1 capsule (40 mg total) by mouth daily. 30 capsule 1  . pantoprazole (PROTONIX) 40 MG tablet Take 40 mg by mouth daily.    . potassium chloride (K-DUR) 10 MEQ tablet     . risperidone (  RISPERDAL) 4 MG tablet Take 4 mg by mouth daily.    . tamsulosin (FLOMAX) 0.4 MG CAPS capsule     . traZODone (DESYREL) 50 MG tablet Take 50 mg by mouth at bedtime.    . Vitamin D, Ergocalciferol, (DRISDOL) 50000 UNITS CAPS capsule Take 50,000 Units by mouth every 7 (seven) days.     No current facility-administered medications for this visit.     Allergies as of 03/17/2018  . (No Known Allergies)    ROS:  General: Negative for anorexia, weight loss, fever, chills, fatigue, weakness. ENT: Negative for hoarseness, difficulty swallowing , nasal congestion. CV: Negative for chest pain, angina, palpitations, dyspnea on exertion, peripheral edema.  Respiratory: Negative for dyspnea at rest, dyspnea on exertion, cough, sputum, wheezing.  GI: See history of present illness. GU:  Negative  for dysuria, hematuria, urinary incontinence, urinary frequency, nocturnal urination.  Endo: Negative for unusual weight change.    Physical Examination:   BP 129/84   Pulse 79   Ht  (1.727 m)   Wt 238 lb 3.2 oz (108 kg)   BMI 36.22 kg/m   General: Well-nourished, well-developed in no acute distress.  Eyes: No icterus. Conjunctivae pink. Mouth: Oropharyngeal mucosa moist and pink , no lesions erythema or exudate. Lungs: Clear to auscultation bilaterally. Non-labored. Heart: Regular rate and rhythm, no murmurs rubs or gallops.  Abdomen: Bowel sounds are normal, nontender, nondistended, no hepatosplenomegaly or masses, no abdominal bruits or hernia , no rebound or guarding.   Extremities: No lower extremity edema. No clubbing or deformities. Neuro: Alert and oriented x 3.  Grossly intact. Skin: Warm and dry, no jaundice.   Psych: Alert and cooperative, normal mood and affect.   Imaging Studies: No results found.  Assessment and Plan:   Larry Riggs is a 62 y.o. y/o male witheosinophilic esophagitis. Endoscopically and on biopsy were suggestive. He failed PPI treatment. Stricture dilated . Symptoms of dysphagia returned after stopping fluticasone inhaler after 6 weeks. Explained often symptoms recur after cessation of treatment and at times requires long term Rx.   Plan  1/ Restart Fluticasone inhaler- see me back in 4 weeks and will plan to keep on low dose maintainance.  2. Copy of note given to patient     Dr Wyline Mood  MD,MRCP [facility]) Follow up in 4 weeks

## 2018-03-17 NOTE — Telephone Encounter (Signed)
Pt caregiver left vm to check to see if  rx flonase was called in please call

## 2018-03-18 NOTE — Telephone Encounter (Signed)
Pt care giver is calling to find out if Rx was called back please call her back rx Flonase

## 2018-04-04 ENCOUNTER — Ambulatory Visit (INDEPENDENT_AMBULATORY_CARE_PROVIDER_SITE_OTHER): Payer: Medicare Other | Admitting: Podiatry

## 2018-04-04 ENCOUNTER — Encounter: Payer: Self-pay | Admitting: Podiatry

## 2018-04-04 DIAGNOSIS — B351 Tinea unguium: Secondary | ICD-10-CM

## 2018-04-04 DIAGNOSIS — D689 Coagulation defect, unspecified: Secondary | ICD-10-CM

## 2018-04-04 DIAGNOSIS — E119 Type 2 diabetes mellitus without complications: Secondary | ICD-10-CM

## 2018-04-04 DIAGNOSIS — M79676 Pain in unspecified toe(s): Secondary | ICD-10-CM | POA: Diagnosis not present

## 2018-04-04 NOTE — Progress Notes (Signed)
Complaint:  Visit Type: Patient returns to my office for continued preventative foot care services. Complaint: Patient states" my nails have grown long and thick and become painful to walk and wear shoes" Patient has been diagnosed with DM with no foot complications. The patient presents for preventative foot care services. No changes to ROS.  Patient is taking plavix.  Podiatric Exam: Vascular: dorsalis pedis and posterior tibial pulses are palpable bilateral. Capillary return is immediate. Temperature gradient is WNL. Skin turgor WNL  Sensorium: Normal Semmes Weinstein monofilament test. Normal tactile sensation bilaterally. Nail Exam: Pt has thick disfigured discolored nails with subungual debris noted bilateral entire nail hallux through fifth toenails Ulcer Exam: There is no evidence of ulcer or pre-ulcerative changes or infection. Orthopedic Exam: Muscle tone and strength are WNL. No limitations in general ROM. No crepitus or effusions noted. Foot type and digits show no abnormalities. Bony prominences are unremarkable. Skin: No Porokeratosis. No infection or ulcers  Diagnosis:  Onychomycosis, , Pain in right toe, pain in left toes  Treatment & Plan Procedures and Treatment: Consent by patient was obtained for treatment procedures. The patient understood the discussion of treatment and procedures well. All questions were answered thoroughly reviewed. Debridement of mycotic and hypertrophic toenails, 1 through 5 bilateral and clearing of subungual debris. No ulceration, no infection noted. .ABN signed for 2019.  Return Visit-Office Procedure: Patient instructed to return to the office for a follow up visit 3 months for continued evaluation and treatment.    Joie Hipps DPM 

## 2018-04-19 ENCOUNTER — Ambulatory Visit (INDEPENDENT_AMBULATORY_CARE_PROVIDER_SITE_OTHER): Payer: Medicare Other | Admitting: Gastroenterology

## 2018-04-19 ENCOUNTER — Encounter: Payer: Self-pay | Admitting: Gastroenterology

## 2018-04-19 VITALS — BP 146/89 | HR 69 | Ht 68.0 in | Wt 236.0 lb

## 2018-04-19 DIAGNOSIS — K200 Eosinophilic esophagitis: Secondary | ICD-10-CM

## 2018-04-19 MED ORDER — FLUTICASONE PROPIONATE HFA 220 MCG/ACT IN AERO: INHALATION_SPRAY | RESPIRATORY_TRACT | 0 refills | Status: AC

## 2018-04-19 NOTE — Progress Notes (Signed)
Wyline MoodKiran Minervia Osso MD, MRCP(U.K) [address]  [address]  [place], [place] [address]  Main: [phone]  Fax: [phone]   Primary Care Physician: Sherrie MustacheJadali, Fayegh, MD  Primary Gastroenterologist:  Dr. Wyline MoodKiran Issaac Shipper   Chief Complaint  Patient presents with  . Dysphagia    HPI: Larry Riggs is a 62 y.o. male   Summary of history :  He was initially referred and seen on 09/15/17 for dysphagia . On Protonix He is on Plavix.   EGD :One severe (stenosis; an endoscope cannot pass) benign-appearing, intrinsic stenosis was found 25 to 26 cm from the incisors. Balloon dilator was performed to 12 mm.Biopsies were taken and showed upto 10 eosinophils phpf. 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  12/2017 EGD:  Normal except for hiatal hernia 5 cm.   Interval history 03/2018 -04/19/18  He says that while on inhaler symptoms were better but he was inhaling the medication not swallowing .   Presently has difficulty swallowing .     Current Outpatient Medications  Medication Sig Dispense Refill  . ACCU-CHEK SOFTCLIX LANCETS lancets     . albuterol (PROAIR HFA) 108 (90 Base) MCG/ACT inhaler Inhale into the lungs.    Marland Kitchen. amLODipine (NORVASC) 2.5 MG tablet Take 1 tablet (2.5 mg total) by mouth daily. 90 tablet 3  . aspirin 81 MG EC tablet Take 81 mg by mouth.    . clopidogrel (PLAVIX) 75 MG tablet TAKE 1 TABLET DAILY 90 tablet 3  . DULoxetine HCl 40 MG CPEP     . fluticasone (FLONASE) 50 MCG/ACT nasal spray Place 1 spray into both nostrils daily. 16 g 0  . fluticasone (FLOVENT HFA) 220 MCG/ACT inhaler 2 puffs and swallow. Take two (2) times daily after breakfast and bedtime. Rinse mouth after each use. 1 Inhaler 0  . glipiZIDE (GLUCOTROL XL) 5 MG 24 hr tablet Take 5 mg by mouth 2 (two) times daily.    . hydrochlorothiazide (HYDRODIURIL) 25 MG tablet Take 25 mg by mouth daily.    Marland Kitchen. JANUVIA 100 MG tablet     . lisinopril (PRINIVIL,ZESTRIL)  40 MG tablet Take 40 mg by mouth daily.    . metFORMIN (GLUCOPHAGE) 1000 MG tablet Take 1 tablet (1,000 mg total) by mouth 2 (two) times daily with a meal.    . metoprolol (LOPRESSOR) 50 MG tablet Take 50 mg by mouth 2 (two) times daily.    . metoprolol succinate (TOPROL-XL) 50 MG 24 hr tablet Take by mouth.    . METOPROLOL TARTRATE PO Take 50 mg by mouth.    Marland Kitchen. omeprazole (PRILOSEC) 40 MG capsule Take 1 capsule (40 mg total) by mouth daily. 30 capsule 1  . pantoprazole (PROTONIX) 40 MG tablet Take 40 mg by mouth daily.    . potassium chloride (K-DUR) 10 MEQ tablet     . risperidone (RISPERDAL) 4 MG tablet Take 4 mg by mouth daily.    . tamsulosin (FLOMAX) 0.4 MG CAPS capsule     . traZODone (DESYREL) 50 MG tablet Take 50 mg by mouth at bedtime.    . Vitamin D, Ergocalciferol, (DRISDOL) 50000 UNITS CAPS capsule Take 50,000 Units by mouth every 7 (seven) days.     No current facility-administered medications for this visit.     Allergies as of 04/19/2018  . (No Known Allergies)    ROS:  General: Negative for anorexia, weight loss, fever, chills, fatigue, weakness. ENT: Negative for hoarseness, difficulty swallowing , nasal congestion.  CV: Negative for chest pain, angina, palpitations, dyspnea on exertion, peripheral edema.  Respiratory: Negative for dyspnea at rest, dyspnea on exertion, cough, sputum, wheezing.  GI: See history of present illness. GU:  Negative for dysuria, hematuria, urinary incontinence, urinary frequency, nocturnal urination.  Endo: Negative for unusual weight change.    Physical Examination:   Ht 5\' 8"  (1.727 m)   Wt 236 lb (107 kg)   BMI 35.88 kg/m   General: Well-nourished, well-developed in no acute distress.  Eyes: No icterus. Conjunctivae pink. Mouth: Oropharyngeal mucosa moist and pink , no lesions erythema or exudate. Lungs: Clear to auscultation bilaterally. Non-labored. Heart: Regular rate and rhythm, no murmurs rubs or gallops.  Abdomen: Bowel  sounds are normal, nontender, nondistended, no hepatosplenomegaly or masses, no abdominal bruits or hernia , no rebound or guarding.   Extremities: No lower extremity edema. No clubbing or deformities. Neuro: Alert and oriented x 3.  Grossly intact. Skin: Warm and dry, no jaundice.   Psych: Alert and cooperative, normal mood and affect.   Imaging Studies: No results found.  Assessment and Plan:   Larry Riggs is a 62 y.o. y/o male with eosinophilic esophagitis. Endoscopically and on biopsy were suggestive. He failed PPI treatment. Stricture dilated . Symptoms of dysphagia returned after stopping fluticasone inhaler after 6 weeks. Since last visit he started inhaling the fluticasone with recurrence of symptoms. He has been clearly explained today to swallow the inhaler puffs  Plan  1. Restart Fluticasone inhaler- he has ben instructed to swallow the puff rather than inhale.     Dr Wyline Mood  MD,MRCP [facility]) Follow up in 6 weeks

## 2018-05-06 ENCOUNTER — Encounter: Payer: Self-pay | Admitting: Gastroenterology

## 2018-05-10 ENCOUNTER — Telehealth: Payer: Self-pay | Admitting: Gastroenterology

## 2018-05-10 NOTE — Telephone Encounter (Signed)
*  STAT* If patient is at the pharmacy, call can be transferred to refill team.   1. Which medications need to be refilled? (please list name of each medication and dose if known) Flonase 50 MCG and Flovent 220 MCG  2. Which pharmacy/location (including street and city if local pharmacy) is medication to be sent to? [facility]  3. Do they need a 30 day or 90 day supply? 90 day   Did Dr. Tobi BastosAnna prescribe this? If not please call patient.

## 2018-05-26 ENCOUNTER — Encounter: Payer: Self-pay | Admitting: Gastroenterology

## 2018-05-26 ENCOUNTER — Other Ambulatory Visit: Payer: Self-pay

## 2018-05-26 ENCOUNTER — Ambulatory Visit (INDEPENDENT_AMBULATORY_CARE_PROVIDER_SITE_OTHER): Payer: Medicare Other | Admitting: Gastroenterology

## 2018-05-26 VITALS — BP 124/79 | HR 76 | Resp 17 | Ht 68.0 in | Wt 235.0 lb

## 2018-05-26 DIAGNOSIS — K200 Eosinophilic esophagitis: Secondary | ICD-10-CM | POA: Diagnosis not present

## 2018-05-26 NOTE — Progress Notes (Signed)
Larry Riggs Jeanita Carneiro MD, MRCP(U.K) [address]  [address]  [place], [place] [address]  Main: [phone]  Fax: [phone]   Primary Care Physician: Sherrie MustacheJadali, Fayegh, MD  Primary Gastroenterologist:  Dr. Wyline Riggs Cadell Gabrielson   No chief complaint on file.   HPI: Larry HeinzKent H Riggs is a 62 y.o. male   Summary of history :  He was initially referred and seen on 09/15/17 for dysphagia . On Protonix He is on Plavix.   EGD :One severe (stenosis; an endoscope cannot pass) benign-appearing, intrinsic stenosis was found 25 to 26 cm from the incisors. Balloon dilator was performed to 12 mm.Biopsies were taken and showed upto 10 eosinophils phpf. 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  12/2017 EGD: Normal except for hiatal hernia 5 cm. He says that while on steroid  inhaler symptoms were better but he was inhaling the medication not swallowing, hence in 04/2018 decide to restart fluticasone for 6 weeks after proper instructions to use the inhaler.  .   Interval history 04/19/18- 05/26/18   Completed 6 weeks of Fluticasone inhaler. No issues swallowing , doing well.   Current Outpatient Medications  Medication Sig Dispense Refill  . ACCU-CHEK SOFTCLIX LANCETS lancets     . albuterol (PROAIR HFA) 108 (90 Base) MCG/ACT inhaler Inhale into the lungs.    Marland Kitchen. amLODipine (NORVASC) 2.5 MG tablet Take 1 tablet (2.5 mg total) by mouth daily. 90 tablet 3  . aspirin 81 MG EC tablet Take 81 mg by mouth.    Marland Kitchen. atorvastatin (LIPITOR) 80 MG tablet Take 80 mg by mouth daily at 6 PM.    . clopidogrel (PLAVIX) 75 MG tablet TAKE 1 TABLET DAILY 90 tablet 3  . DULoxetine (CYMBALTA) 60 MG capsule     . DULoxetine HCl 40 MG CPEP     . fluticasone (FLONASE) 50 MCG/ACT nasal spray Place 1 spray into both nostrils daily. 16 g 0  . fluticasone (FLOVENT HFA) 220 MCG/ACT inhaler 2 puffs and swallow. Take two (2) times daily after breakfast and bedtime. Rinse mouth after each use.  1 Inhaler 0  . glipiZIDE (GLUCOTROL XL) 5 MG 24 hr tablet Take 5 mg by mouth 2 (two) times daily.    . hydrochlorothiazide (HYDRODIURIL) 25 MG tablet Take 25 mg by mouth daily.    Marland Kitchen. JANUVIA 100 MG tablet     . lisinopril (PRINIVIL,ZESTRIL) 40 MG tablet Take 40 mg by mouth daily.    . metFORMIN (GLUCOPHAGE) 1000 MG tablet Take 1 tablet (1,000 mg total) by mouth 2 (two) times daily with a meal.    . metoprolol succinate (TOPROL-XL) 50 MG 24 hr tablet Take by mouth.    . metoprolol tartrate (LOPRESSOR) 50 MG tablet     . pantoprazole (PROTONIX) 40 MG tablet Take 40 mg by mouth daily.    . potassium chloride (K-DUR) 10 MEQ tablet     . risperidone (RISPERDAL) 4 MG tablet Take 4 mg by mouth daily.    . tamsulosin (FLOMAX) 0.4 MG CAPS capsule     . traZODone (DESYREL) 50 MG tablet Take 50 mg by mouth at bedtime.     No current facility-administered medications for this visit.     Allergies as of 05/26/2018  . (No Known Allergies)    ROS:  General: Negative for anorexia, weight loss, fever, chills, fatigue, weakness. ENT: Negative for hoarseness, difficulty swallowing , nasal congestion. CV: Negative for chest pain, angina, palpitations, dyspnea on exertion, peripheral edema.  Respiratory: Negative for dyspnea at rest, dyspnea on exertion, cough, sputum, wheezing.  GI: See history of present illness. GU:  Negative for dysuria, hematuria, urinary incontinence, urinary frequency, nocturnal urination.  Endo: Negative for unusual weight change.    Physical Examination:   There were no vitals taken for this visit.  General: Well-nourished, well-developed in no acute distress.  Eyes: No icterus. Conjunctivae pink. Mouth: Oropharyngeal mucosa moist and pink , no lesions erythema or exudate. Lungs: Clear to auscultation bilaterally. Non-labored. Heart: Regular rate and rhythm, no murmurs rubs or gallops.  Abdomen: Bowel sounds are normal, nontender, nondistended, no hepatosplenomegaly or  masses, no abdominal bruits or hernia , no rebound or guarding.   Extremities: No lower extremity edema. No clubbing or deformities. Neuro: Alert and oriented x 3.  Grossly intact. Skin: Warm and dry, no jaundice.   Psych: Alert and cooperative, normal mood and affect.   Imaging Studies: No results found.  Assessment and Plan:   Larry Riggs is a 62 y.o. 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  Stricture dilated. Symptoms of dysphagia did not resolve which was attributed to a faulty inhaler usage techniqie. Treatment was repeated with proper technique and seems to have resolved the dysphagia  Plan  1. Can stop Fluticasone  2. If symptoms return to call me back ,     Dr Larry Mood  MD,MRCP [facility]) Follow up PRN

## 2018-05-31 ENCOUNTER — Ambulatory Visit: Payer: Federal, State, Local not specified - PPO | Admitting: Gastroenterology

## 2018-06-02 NOTE — Telephone Encounter (Signed)
error 

## 2018-07-11 ENCOUNTER — Ambulatory Visit (INDEPENDENT_AMBULATORY_CARE_PROVIDER_SITE_OTHER): Payer: Medicare Other | Admitting: Podiatry

## 2018-07-11 ENCOUNTER — Encounter: Payer: Self-pay | Admitting: Podiatry

## 2018-07-11 DIAGNOSIS — E119 Type 2 diabetes mellitus without complications: Secondary | ICD-10-CM

## 2018-07-11 DIAGNOSIS — D689 Coagulation defect, unspecified: Secondary | ICD-10-CM | POA: Diagnosis not present

## 2018-07-11 DIAGNOSIS — B351 Tinea unguium: Secondary | ICD-10-CM | POA: Diagnosis not present

## 2018-07-11 DIAGNOSIS — M79676 Pain in unspecified toe(s): Secondary | ICD-10-CM | POA: Diagnosis not present

## 2018-07-11 NOTE — Progress Notes (Signed)
Complaint:  Visit Type: Patient returns to my office for continued preventative foot care services. Complaint: Patient states" my nails have grown long and thick and become painful to walk and wear shoes" Patient has been diagnosed with DM with no foot complications. The patient presents for preventative foot care services. No changes to ROS.  Patient is taking plavix.  Podiatric Exam: Vascular: dorsalis pedis and posterior tibial pulses are palpable bilateral. Capillary return is immediate. Temperature gradient is WNL. Skin turgor WNL  Sensorium: Normal Semmes Weinstein monofilament test. Normal tactile sensation bilaterally. Nail Exam: Pt has thick disfigured discolored nails with subungual debris noted bilateral entire nail hallux through fifth toenails Ulcer Exam: There is no evidence of ulcer or pre-ulcerative changes or infection. Orthopedic Exam: Muscle tone and strength are WNL. No limitations in general ROM. No crepitus or effusions noted. Foot type and digits show no abnormalities. Bony prominences are unremarkable. Skin: No Porokeratosis. No infection or ulcers  Diagnosis:  Onychomycosis, , Pain in right toe, pain in left toes  Treatment & Plan Procedures and Treatment: Consent by patient was obtained for treatment procedures. The patient understood the discussion of treatment and procedures well. All questions were answered thoroughly reviewed. Debridement of mycotic and hypertrophic toenails, 1 through 5 bilateral and clearing of subungual debris. No ulceration, no infection noted. .ABN signed for 2019.  Return Visit-Office Procedure: Patient instructed to return to the office for a follow up visit 3 months for continued evaluation and treatment.    Danny Yackley DPM 

## 2018-10-10 ENCOUNTER — Ambulatory Visit (INDEPENDENT_AMBULATORY_CARE_PROVIDER_SITE_OTHER): Payer: Medicare Other | Admitting: Podiatry

## 2018-10-10 ENCOUNTER — Encounter: Payer: Self-pay | Admitting: Podiatry

## 2018-10-10 DIAGNOSIS — M79676 Pain in unspecified toe(s): Secondary | ICD-10-CM | POA: Diagnosis not present

## 2018-10-10 DIAGNOSIS — E119 Type 2 diabetes mellitus without complications: Secondary | ICD-10-CM | POA: Diagnosis not present

## 2018-10-10 DIAGNOSIS — B351 Tinea unguium: Secondary | ICD-10-CM

## 2018-10-10 DIAGNOSIS — D689 Coagulation defect, unspecified: Secondary | ICD-10-CM | POA: Diagnosis not present

## 2018-10-10 NOTE — Progress Notes (Signed)
Complaint:  Visit Type: Patient returns to my office for continued preventative foot care services. Complaint: Patient states" my nails have grown long and thick and become painful to walk and wear shoes" Patient has been diagnosed with DM with no foot complications. The patient presents for preventative foot care services. No changes to ROS.  Patient is taking plavix.  Podiatric Exam: Vascular: dorsalis pedis and posterior tibial pulses are palpable bilateral. Capillary return is immediate. Temperature gradient is WNL. Skin turgor WNL  Sensorium: Normal Semmes Weinstein monofilament test. Normal tactile sensation bilaterally. Nail Exam: Pt has thick disfigured discolored nails with subungual debris noted bilateral entire nail hallux through fifth toenails Ulcer Exam: There is no evidence of ulcer or pre-ulcerative changes or infection. Orthopedic Exam: Muscle tone and strength are WNL. No limitations in general ROM. No crepitus or effusions noted. Foot type and digits show no abnormalities. Bony prominences are unremarkable. Skin: No Porokeratosis. No infection or ulcers  Diagnosis:  Onychomycosis, , Pain in right toe, pain in left toes  Treatment & Plan Procedures and Treatment: Consent by patient was obtained for treatment procedures. The patient understood the discussion of treatment and procedures well. All questions were answered thoroughly reviewed. Debridement of mycotic and hypertrophic toenails, 1 through 5 bilateral and clearing of subungual debris. No ulceration, no infection noted. ABN signed for 2019. Return Visit-Office Procedure: Patient instructed to return to the office for a follow up visit 4 months for continued evaluation and treatment.    Helane GuntherGregory Deaisha Welborn DPM

## 2019-01-02 ENCOUNTER — Other Ambulatory Visit: Payer: Self-pay | Admitting: Neurology

## 2019-01-02 DIAGNOSIS — R251 Tremor, unspecified: Secondary | ICD-10-CM

## 2019-01-09 ENCOUNTER — Ambulatory Visit (INDEPENDENT_AMBULATORY_CARE_PROVIDER_SITE_OTHER): Payer: Medicare Other | Admitting: Podiatry

## 2019-01-09 ENCOUNTER — Encounter: Payer: Self-pay | Admitting: Podiatry

## 2019-01-09 DIAGNOSIS — B351 Tinea unguium: Secondary | ICD-10-CM | POA: Diagnosis not present

## 2019-01-09 DIAGNOSIS — M79676 Pain in unspecified toe(s): Secondary | ICD-10-CM

## 2019-01-09 DIAGNOSIS — E119 Type 2 diabetes mellitus without complications: Secondary | ICD-10-CM

## 2019-01-09 DIAGNOSIS — D689 Coagulation defect, unspecified: Secondary | ICD-10-CM

## 2019-01-09 NOTE — Progress Notes (Signed)
Complaint:  Visit Type: Patient returns to my office for continued preventative foot care services. Complaint: Patient states" my nails have grown long and thick and become painful to walk and wear shoes" Patient has been diagnosed with DM with no foot complications. The patient presents for preventative foot care services. No changes to ROS.  Patient is taking plavix.  Podiatric Exam: Vascular: dorsalis pedis and posterior tibial pulses are palpable bilateral. Capillary return is immediate. Temperature gradient is WNL. Skin turgor WNL  Sensorium: Normal Semmes Weinstein monofilament test. Normal tactile sensation bilaterally. Nail Exam: Pt has thick disfigured discolored nails with subungual debris noted bilateral entire nail hallux through fifth toenails Ulcer Exam: There is no evidence of ulcer or pre-ulcerative changes or infection. Orthopedic Exam: Muscle tone and strength are WNL. No limitations in general ROM. No crepitus or effusions noted. Foot type and digits show no abnormalities. Bony prominences are unremarkable. Skin: No Porokeratosis. No infection or ulcers  Diagnosis:  Onychomycosis, , Pain in right toe, pain in left toes  Treatment & Plan Procedures and Treatment: Consent by patient was obtained for treatment procedures. The patient understood the discussion of treatment and procedures well. All questions were answered thoroughly reviewed. Debridement of mycotic and hypertrophic toenails, 1 through 5 bilateral and clearing of subungual debris. No ulceration, no infection noted.   Return Visit-Office Procedure: Patient instructed to return to the office for a follow up visit 3 months for continued evaluation and treatment.    Amberlyn Martinezgarcia DPM 

## 2019-01-12 ENCOUNTER — Ambulatory Visit
Admission: RE | Admit: 2019-01-12 | Discharge: 2019-01-12 | Disposition: A | Discharge status: Home / Self Care | Payer: Medicare Other | Source: Ambulatory Visit | Attending: Neurology | Admitting: Neurology

## 2019-01-12 DIAGNOSIS — R251 Tremor, unspecified: Secondary | ICD-10-CM | POA: Insufficient documentation

## 2019-03-21 ENCOUNTER — Ambulatory Visit: Admit: 2019-03-21 | Payer: Medicare Other | Admitting: Ophthalmology

## 2019-03-21 SURGERY — PHACOEMULSIFICATION, CATARACT, WITH IOL INSERTION
Anesthesia: Choice | Laterality: Left

## 2019-03-25 ENCOUNTER — Emergency Department: Payer: Medicare Other

## 2019-03-25 ENCOUNTER — Inpatient Hospital Stay
Admission: EM | Admit: 2019-03-25 | Discharge: 2019-03-27 | DRG: 101 | Disposition: A | Discharge status: Home / Self Care | Payer: Medicare Other | Attending: Internal Medicine | Admitting: Internal Medicine

## 2019-03-25 ENCOUNTER — Other Ambulatory Visit: Payer: Self-pay

## 2019-03-25 DIAGNOSIS — Z7982 Long term (current) use of aspirin: Secondary | ICD-10-CM | POA: Diagnosis not present

## 2019-03-25 DIAGNOSIS — J45909 Unspecified asthma, uncomplicated: Secondary | ICD-10-CM | POA: Diagnosis present

## 2019-03-25 DIAGNOSIS — I2510 Atherosclerotic heart disease of native coronary artery without angina pectoris: Secondary | ICD-10-CM | POA: Diagnosis present

## 2019-03-25 DIAGNOSIS — Z1159 Encounter for screening for other viral diseases: Secondary | ICD-10-CM

## 2019-03-25 DIAGNOSIS — I10 Essential (primary) hypertension: Secondary | ICD-10-CM | POA: Diagnosis present

## 2019-03-25 DIAGNOSIS — E785 Hyperlipidemia, unspecified: Secondary | ICD-10-CM | POA: Diagnosis present

## 2019-03-25 DIAGNOSIS — E876 Hypokalemia: Secondary | ICD-10-CM | POA: Diagnosis present

## 2019-03-25 DIAGNOSIS — Z7984 Long term (current) use of oral hypoglycemic drugs: Secondary | ICD-10-CM

## 2019-03-25 DIAGNOSIS — Z79899 Other long term (current) drug therapy: Secondary | ICD-10-CM

## 2019-03-25 DIAGNOSIS — R569 Unspecified convulsions: Principal | ICD-10-CM | POA: Diagnosis present

## 2019-03-25 DIAGNOSIS — R55 Syncope and collapse: Secondary | ICD-10-CM

## 2019-03-25 DIAGNOSIS — Z833 Family history of diabetes mellitus: Secondary | ICD-10-CM

## 2019-03-25 DIAGNOSIS — Z955 Presence of coronary angioplasty implant and graft: Secondary | ICD-10-CM

## 2019-03-25 DIAGNOSIS — F70 Mild intellectual disabilities: Secondary | ICD-10-CM | POA: Diagnosis present

## 2019-03-25 DIAGNOSIS — G2119 Other drug induced secondary parkinsonism: Secondary | ICD-10-CM | POA: Diagnosis present

## 2019-03-25 DIAGNOSIS — Z7902 Long term (current) use of antithrombotics/antiplatelets: Secondary | ICD-10-CM

## 2019-03-25 DIAGNOSIS — E871 Hypo-osmolality and hyponatremia: Secondary | ICD-10-CM | POA: Diagnosis present

## 2019-03-25 DIAGNOSIS — Z8249 Family history of ischemic heart disease and other diseases of the circulatory system: Secondary | ICD-10-CM

## 2019-03-25 DIAGNOSIS — F419 Anxiety disorder, unspecified: Secondary | ICD-10-CM | POA: Diagnosis present

## 2019-03-25 DIAGNOSIS — E119 Type 2 diabetes mellitus without complications: Secondary | ICD-10-CM | POA: Diagnosis present

## 2019-03-25 DIAGNOSIS — Z8673 Personal history of transient ischemic attack (TIA), and cerebral infarction without residual deficits: Secondary | ICD-10-CM

## 2019-03-25 DIAGNOSIS — K219 Gastro-esophageal reflux disease without esophagitis: Secondary | ICD-10-CM | POA: Diagnosis present

## 2019-03-25 DIAGNOSIS — F319 Bipolar disorder, unspecified: Secondary | ICD-10-CM | POA: Diagnosis present

## 2019-03-25 LAB — BASIC METABOLIC PANEL
Anion gap: 16 — ABNORMAL HIGH (ref 5–15)
BUN: 9 mg/dL (ref 8–23)
CO2: 26 mmol/L (ref 22–32)
Calcium: 9 mg/dL (ref 8.9–10.3)
Chloride: 83 mmol/L — ABNORMAL LOW (ref 98–111)
Creatinine, Ser: 1.09 mg/dL (ref 0.61–1.24)
GFR calc Af Amer: >60 mL/min (ref >60)
GFR calc non Af Amer: >60 mL/min (ref >60)
Glucose, Bld: 206 mg/dL — ABNORMAL HIGH (ref 70–99)
Potassium: 3.1 mmol/L — ABNORMAL LOW (ref 3.5–5.1)
Sodium: 125 mmol/L — ABNORMAL LOW (ref 135–145)

## 2019-03-25 LAB — GLUCOSE, CAPILLARY: Glucose-Capillary: 193 mg/dL — ABNORMAL HIGH (ref 70–99)

## 2019-03-25 LAB — SARS CORONAVIRUS 2 BY RT PCR (HOSPITAL ORDER, PERFORMED IN ~~LOC~~ HOSPITAL LAB): SARS Coronavirus 2: NEGATIVE

## 2019-03-25 LAB — CBC WITH DIFFERENTIAL/PLATELET
Abs Immature Granulocytes: 0.04 10*3/uL (ref 0.00–0.07)
Basophils Absolute: 0 10*3/uL (ref 0.0–0.1)
Basophils Relative: 0 %
Eosinophils Absolute: 0.4 10*3/uL (ref 0.0–0.5)
Eosinophils Relative: 4 %
HCT: 42.3 % (ref 39.0–52.0)
Hemoglobin: 14.7 g/dL (ref 13.0–17.0)
Immature Granulocytes: 0 %
Lymphocytes Relative: 16 %
Lymphs Abs: 1.5 10*3/uL (ref 0.7–4.0)
MCH: 27.6 pg (ref 26.0–34.0)
MCHC: 34.8 g/dL (ref 30.0–36.0)
MCV: 79.4 fL — ABNORMAL LOW (ref 80.0–100.0)
Monocytes Absolute: 0.8 10*3/uL (ref 0.1–1.0)
Monocytes Relative: 8 %
Neutro Abs: 6.7 10*3/uL (ref 1.7–7.7)
Neutrophils Relative %: 72 %
Platelets: 210 10*3/uL (ref 150–400)
RBC: 5.33 MIL/uL (ref 4.22–5.81)
RDW: 13.1 % (ref 11.5–15.5)
WBC: 9.4 10*3/uL (ref 4.0–10.5)
nRBC: 0 % (ref 0.0–0.2)

## 2019-03-25 LAB — TSH: TSH: 1.458 u[IU]/mL (ref 0.350–4.500)

## 2019-03-25 MED ORDER — SODIUM CHLORIDE 0.9 % IV SOLN
INTRAVENOUS | Status: DC
  Administered 2019-03-25 – 2019-03-27 (×3): via INTRAVENOUS

## 2019-03-25 MED ORDER — INSULIN ASPART 100 UNIT/ML ~~LOC~~ SOLN: 0.0000 [IU] | Freq: Every day | SUBCUTANEOUS | Status: DC

## 2019-03-25 MED ORDER — SODIUM CHLORIDE 0.9 % IV BOLUS
1000.0000 mL | Freq: Once | INTRAVENOUS | Status: AC
  Administered 2019-03-25: 1000 mL via INTRAVENOUS

## 2019-03-25 MED ORDER — LORAZEPAM 2 MG/ML IJ SOLN: 2.0000 mg | INTRAMUSCULAR | Status: DC | PRN

## 2019-03-25 MED ORDER — INSULIN ASPART 100 UNIT/ML ~~LOC~~ SOLN
0.0000 [IU] | Freq: Three times a day (TID) | SUBCUTANEOUS | Status: DC
  Administered 2019-03-26: 1 [IU] via SUBCUTANEOUS
  Administered 2019-03-26: 2 [IU] via SUBCUTANEOUS
  Administered 2019-03-26: 17:00:00 1 [IU] via SUBCUTANEOUS
  Administered 2019-03-27: 2 [IU] via SUBCUTANEOUS
  Filled 2019-03-25 (×5): qty 1

## 2019-03-25 NOTE — Progress Notes (Signed)
Pt admitted from ED

## 2019-03-25 NOTE — Progress Notes (Signed)
Family Meeting Note  Advance Directive:yes  Today a meeting took place with the Brother who is power of attorney.  The following clinical team members were present during this meeting:MD  The following were discussed:Patient's diagnosis: Seizure, syncopal episode, coronary artery disease, diabetes, Patient's progosis: Unable to determine and Goals for treatment: Continue present management  As per brother we should give trial of CPR, defibrillator use and medications during any adverse cardiac event but he would recommend not to use intubation or ventilatory support.  Additional follow-up to be provided: Neurology  Time spent during discussion:20 minutes  Altamese Dilling, MD

## 2019-03-25 NOTE — ED Provider Notes (Signed)
[facility] Emergency Department Provider Note  ____________________________________________   I have reviewed the triage vital signs and the nursing notes.   HISTORY  Chief Complaint Seizures   History limited by: Not Limited   HPI Larry Riggs is a 63 y.o. male who presents to the emergency department today after a seizure like episode. Patient arrives via EMS from group home. The patient is not sure what happened. States that he had been having a normal day today and had felt in his normal health. Denies any headaches, fevers, n/v/d. Denies any new medication. Denies any alcohol or drug use. Says that he was evaluated in a hospital 2 or 3 years ago for an apparent seizure like episode.   Records reviewed. Per medical record review patient has a history of cva  Past Medical History:  Diagnosis Date  . Asthma   . Coronary atherosclerosis of native coronary vessel    a. 08/2013 Cath: signif 3V dzs w EF of 55%, eval @ [facility] for CABG but felt that there was no good target in the LAD;  b. 12/2013 Cath/PCI: LM nl, LAD 7479m (PTCA), 95d (attempted PTCA), subtl distal, D1 95 (2.75x16 Promus DES), D2 90ost, D3 80-90ost, D4 60ost, LCX 90p, small, RCA 6982m, PDA nl, RPL1 60, RPL2 90ost.  . Dysphagia   . GERD (gastroesophageal reflux disease)   . Hyperlipidemia   . Hypertension   . Mildly mentally retarded   . Obesity   . Type II diabetes mellitus [facility](HCC)     Patient Active Problem List   Diagnosis Date Noted  . Elevated troponin 03/12/2016  . CVA (cerebral infarction)   . Syncope and collapse 03/09/2016  . Seizures (HCC) 03/09/2016  . Hypokalemia 03/09/2016  . Hyperglycemia 03/09/2016  . Syncope 03/09/2016  . Angina, class III (HCC) 12/28/2013  . Presence of drug coated stent (Promus Premier DES 2.75 x 16) in D1 branch of LAD coronary artery; PTCA of LAD 12/28/2013  . Type II diabetes mellitus (HCC)   . Asthma   . Angina effort 12/27/2013  . Coronary  atherosclerosis of native coronary vessel   . Hyperlipidemia   . Hypertension     Past Surgical History:  Procedure Laterality Date  . CARDIAC CATHETERIZATION  08/2013   [facility]  . CORONARY ANGIOPLASTY WITH STENT PLACEMENT  12/27/2013   "1"  . ESOPHAGOGASTRODUODENOSCOPY (EGD) WITH PROPOFOL N/A 09/23/2017   Procedure: ESOPHAGOGASTRODUODENOSCOPY (EGD) WITH PROPOFOL WITH DILATION;  Surgeon: [name], Kiran, MD;  Location: [facility] ENDOSCOPY;  Service: Gastroenterology;  Laterality: N/A;  . ESOPHAGOGASTRODUODENOSCOPY (EGD) WITH PROPOFOL N/A 12/28/2017   Procedure: ESOPHAGOGASTRODUODENOSCOPY (EGD) WITH PROPOFOL WITH DILATION;  Surgeon: [name], Kiran, MD;  [facility];  Service: Gastroenterology;  Laterality: N/A;  . INGUINAL HERNIA REPAIR Right 1990's  . PERCUTANEOUS CORONARY STENT INTERVENTION (PCI-S) N/A 12/27/2013   Procedure: PERCUTANEOUS CORONARY STENT INTERVENTION (PCI-S);  Surgeon: Iran OuchMuhammad A Arida, MD;  [facility];  Service: Cardiovascular;  Laterality: N/A;    Prior to Admission medications   Medication Sig Start Date End Date Taking? Authorizing Provider  ACCU-CHEK SOFTCLIX LANCETS lancets  09/15/17   [provider]  albuterol (PROAIR HFA) 108 (90 Base) MCG/ACT inhaler Inhale into the lungs.    [provider]  amLODipine (NORVASC) 2.5 MG tablet Take 1 tablet (2.5 mg total) by mouth daily. 11/19/15   Iran OuchArida, Muhammad A, MD  aspirin 81 MG EC tablet Take 81 mg by mouth.    [provider]  atorvastatin (LIPITOR) 80  MG tablet Take 80 mg by mouth daily at 6 PM.    [provider]  clopidogrel (PLAVIX) 75 MG tablet TAKE 1 TABLET DAILY 03/06/16   Iran Ouch, MD  DULoxetine (CYMBALTA) 60 MG capsule  04/29/18   [provider]  DULoxetine HCl 40 MG CPEP  08/18/17   [provider]  fluticasone (FLOVENT HFA) 220 MCG/ACT inhaler 2 puffs and swallow. Take two (2) times daily after breakfast and bedtime. Rinse mouth after each  use. Patient not taking: Reported on 05/26/2018 04/19/18   Wyline Mood, MD  glipiZIDE (GLUCOTROL XL) 5 MG 24 hr tablet Take 5 mg by mouth 2 (two) times daily.    [provider]  hydrochlorothiazide (HYDRODIURIL) 25 MG tablet Take 25 mg by mouth daily.    [provider]  JANUVIA 100 MG tablet  08/05/17   [provider]  lisinopril (PRINIVIL,ZESTRIL) 40 MG tablet Take 40 mg by mouth daily.    [provider]  metFORMIN (GLUCOPHAGE) 1000 MG tablet Take 1 tablet (1,000 mg total) by mouth 2 (two) times daily with a meal. 12/28/13   Creig Hines, NP  metoprolol succinate (TOPROL-XL) 50 MG 24 hr tablet Take by mouth.    [provider]  metoprolol tartrate (LOPRESSOR) 50 MG tablet  05/25/18   [provider]  pantoprazole (PROTONIX) 40 MG tablet Take 40 mg by mouth daily.    [provider]  potassium chloride (K-DUR) 10 MEQ tablet  03/08/18   [provider]  risperidone (RISPERDAL) 4 MG tablet Take 4 mg by mouth daily.    [provider]  tamsulosin (FLOMAX) 0.4 MG CAPS capsule  09/04/17   [provider]  traZODone (DESYREL) 50 MG tablet Take 50 mg by mouth at bedtime.    [provider]    Allergies Patient has no known allergies.  Family History  Problem Relation Age of Onset  . Hypertension Father   . Diabetes Father     Social History Social History   Tobacco Use  . Smoking status: Never Smoker  . Smokeless tobacco: Never Used  Substance Use Topics  . Alcohol use: No  . Drug use: No    Review of Systems Constitutional: No fever/chills Eyes: No visual changes. ENT: No sore throat. Cardiovascular: Denies chest pain. Respiratory: Denies shortness of breath. Gastrointestinal: No abdominal pain.  No nausea, no vomiting.  No diarrhea.   Genitourinary: Negative for dysuria. Musculoskeletal: Negative for back pain. Skin: Negative for rash. Neurological: Negative for  headaches, focal weakness or numbness.  ____________________________________________   PHYSICAL EXAM:  VITAL SIGNS: ED Triage Vitals  Enc Vitals Group     BP 03/25/19 1514 (!) 149/101     Pulse Rate 03/25/19 1514 99     Resp 03/25/19 1514 19     Temp 03/25/19 1514 98.3 F (36.8 C)     Temp Source 03/25/19 1514 Oral     SpO2 03/25/19 1514 99 %     Weight --      Height --      Head Circumference --      Peak Flow --      Pain Score 03/25/19 1515 0   Constitutional: Alert and oriented.  Eyes: Conjunctivae are normal.  ENT      Head: Normocephalic and atraumatic.      Nose: No congestion/rhinnorhea.      Mouth/Throat: Mucous membranes are moist.      Neck: No stridor. Hematological/Lymphatic/Immunilogical: No  cervical lymphadenopathy. Cardiovascular: Normal rate, regular rhythm.  No murmurs, rubs, or gallops.  Respiratory: Normal respiratory effort without tachypnea nor retractions. Breath sounds are clear and equal bilaterally. No wheezes/rales/rhonchi. Gastrointestinal: Soft and non tender. No rebound. No guarding.  Genitourinary: Deferred Musculoskeletal: Normal range of motion in all extremities. No lower extremity edema. Neurologic:  Normal speech and language. Repetitive tremors.  Skin:  Skin is warm, dry and intact. No rash noted. Psychiatric: Mood and affect are normal. Speech and behavior are normal. Patient exhibits appropriate insight and judgment.  ____________________________________________    LABS (pertinent positives/negatives)  BMP na 125, k 3.1, glu 206 CBC wbc 9.4, hgb 14.7, plt 210  ____________________________________________   EKG  I, Phineas Semen, attending physician, personally viewed and interpreted this EKG  EKG Time: 1746 Rate: 81 Rhythm: sinus rhythm  Axis: normal Intervals: qtc 528 QRS: narrow, lvh ST changes: no st elevation Impression: abnormal ekg   ____________________________________________    RADIOLOGY  CT  head  No acute abnormality ____________________________________________   PROCEDURES  Procedures  ____________________________________________   INITIAL IMPRESSION / ASSESSMENT AND PLAN / ED COURSE  Pertinent labs & imaging results that were available during my care of the patient were reviewed by me and considered in my medical decision making (see chart for details).   Patient presented to the emergency department today after possible seizure. The patient was found to be hyponatremic. Head ct negative. Will plan on admission. Discussed findings and plan with patient.  ____________________________________________   FINAL CLINICAL IMPRESSION(S) / ED DIAGNOSES  Final diagnoses:  Seizure-like activity (HCC)  Hyponatremia  Hypokalemia     Note: This dictation was prepared with Dragon dictation. Any transcriptional errors that result from this process are unintentional     Phineas Semen, MD 03/25/19 1818

## 2019-03-25 NOTE — ED Notes (Signed)
Pt given meal tray.

## 2019-03-25 NOTE — ED Notes (Signed)
ED TO INPATIENT HANDOFF REPORT  ED Nurse Name and Phone #: Shanda Bumps [phone]  S Name/Age/Gender Larry Riggs 63 y.o. male Room/Bed: ED15A/ED15A  Code Status   Code Status: Prior  Home/SNF/Other Boarding Home Patient oriented to: self, place, time and situation Is this baseline? Yes   Triage Complete: Triage complete  Chief Complaint Seizure   Triage Note Pt comes via [facility] from [facility] in [place] (group home). Pt was going back to his room when staff starting noticing convulsions. Pt has no hx of seizures. Pt was post-itcal with staff but was alert and oriented for EMS. Pt has had some tremors with EMS but is on medication for that. Pt cbg was 99. Pt is AOx4 now.    Allergies No Known Allergies  Level of Care/Admitting Diagnosis ED Disposition    ED Disposition Condition Comment   Admit  Hospital Area: [facility] [[id number]]  Level of Care: Med-Surg [16]  Covid Evaluation: N/A  Diagnosis: Seizures [facility]) [[id number]]  Admitting Physician: Altamese Dilling [phone]  Attending Physician: Altamese Dilling [phone]  Estimated length of stay: past midnight tomorrow  Certification:: I certify this patient will need inpatient services for at least 2 midnights  PT Class (Do Not Modify): Inpatient [101]  PT Acc Code (Do Not Modify): Private [1]       B Medical/Surgery History Past Medical History:  Diagnosis Date  . Asthma   . Coronary atherosclerosis of native coronary vessel    a. 08/2013 Cath: signif 3V dzs w EF of 55%, eval @ [facility] for CABG but felt that there was no good target in the LAD;  b. 12/2013 Cath/PCI: LM nl, LAD 75m (PTCA), 95d (attempted PTCA), subtl distal, D1 95 (2.75x16 Promus DES), D2 90ost, D3 80-90ost, D4 60ost, LCX 90p, small, RCA 26m, PDA nl, RPL1 60, RPL2 90ost.  . Dysphagia   . GERD (gastroesophageal reflux disease)   . Hyperlipidemia   . Hypertension   . Mildly mentally retarded   . Obesity   . Type II  diabetes mellitus (HCC)    Past Surgical History:  Procedure Laterality Date  . CARDIAC CATHETERIZATION  08/2013   [facility]  . CORONARY ANGIOPLASTY WITH STENT PLACEMENT  12/27/2013   "1"  . ESOPHAGOGASTRODUODENOSCOPY (EGD) WITH PROPOFOL N/A 09/23/2017   Procedure: ESOPHAGOGASTRODUODENOSCOPY (EGD) WITH PROPOFOL WITH DILATION;  Surgeon: Wyline Mood, MD;  Location: [facility] ENDOSCOPY;  Service: Gastroenterology;  Laterality: N/A;  . ESOPHAGOGASTRODUODENOSCOPY (EGD) WITH PROPOFOL N/A 12/28/2017   Procedure: ESOPHAGOGASTRODUODENOSCOPY (EGD) WITH PROPOFOL WITH DILATION;  Surgeon: Wyline Mood, MD;  Location: [facility] ENDOSCOPY;  Service: Gastroenterology;  Laterality: N/A;  . INGUINAL HERNIA REPAIR Right 1990's  . PERCUTANEOUS CORONARY STENT INTERVENTION (PCI-S) N/A 12/27/2013   Procedure: PERCUTANEOUS CORONARY STENT INTERVENTION (PCI-S);  Surgeon: Iran Ouch, MD;  Location: [facility] CATH LAB;  Service: Cardiovascular;  Laterality: N/A;     A IV Location/Drains/Wounds Patient Lines/Drains/Airways Status   Active Line/Drains/Airways    Name:   Placement date:   Placement time:   Site:   Days:   Peripheral IV 03/25/19 Left Antecubital   03/25/19    1517    Antecubital   less than 1   Airway   12/28/17    0813     452          Intake/Output Last 24 hours No intake or output data in the 24 hours ending 03/25/19 1912  Labs/Imaging Results for orders placed or performed during the hospital encounter of 03/25/19 (from  the past 48 hour(s))  SARS Coronavirus 2 [facility] order, Performed in [facility] hospital lab)     Status: None   Collection Time: 03/25/19  3:15 PM  Result Value Ref Range   SARS Coronavirus 2 NEGATIVE NEGATIVE    Comment: (NOTE) If result is NEGATIVE SARS-CoV-2 target nucleic acids are NOT DETECTED. The SARS-CoV-2 RNA is generally detectable in upper and lower  respiratory specimens during the acute phase of infection. The lowest  concentration of SARS-CoV-2 viral copies this assay can  detect is 250  copies / mL. A negative result does not preclude SARS-CoV-2 infection  and should not be used as the sole basis for treatment or other  patient management decisions.  A negative result may occur with  improper specimen collection / handling, submission of specimen other  than nasopharyngeal swab, presence of viral mutation(s) within the  areas targeted by this assay, and inadequate number of viral copies  (<250 copies / mL). A negative result must be combined with clinical  observations, patient history, and epidemiological information. If result is POSITIVE SARS-CoV-2 target nucleic acids are DETECTED. The SARS-CoV-2 RNA is generally detectable in upper and lower  respiratory specimens dur ing the acute phase of infection.  Positive  results are indicative of active infection with SARS-CoV-2.  Clinical  correlation with patient history and other diagnostic information is  necessary to determine patient infection status.  Positive results do  not rule out bacterial infection or co-infection with other viruses. If result is PRESUMPTIVE POSTIVE SARS-CoV-2 nucleic acids MAY BE PRESENT.   A presumptive positive result was obtained on the submitted specimen  and confirmed on repeat testing.  While 2019 novel coronavirus  (SARS-CoV-2) nucleic acids may be present in the submitted sample  additional confirmatory testing may be necessary for epidemiological  and / or clinical management purposes  to differentiate between  SARS-CoV-2 and other Sarbecovirus currently known to infect humans.  If clinically indicated additional testing with an alternate test  methodology [phone]) is advised. The SARS-CoV-2 RNA is generally  detectable in upper and lower respiratory sp ecimens during the acute  phase of infection. The expected result is Negative. Fact Sheet for Patients:  BoilerBrush.com.cy Fact Sheet for Healthcare  Providers: https://pope.com/ This test is not yet approved or cleared by the [place] FDA and has been authorized for detection and/or diagnosis of SARS-CoV-2 by FDA under an Emergency Use Authorization (EUA).  This EUA will remain in effect (meaning this test can be used) for the duration of the COVID-19 declaration under Section 564(b)(1) of the Act, 21 U.S.C. section 360bbb-3(b)(1), unless the authorization is terminated or revoked sooner. Performed at [facility], [address] [place], [place] [address]   Basic metabolic panel - if new onset seizures     Status: Abnormal   Collection Time: 03/25/19  3:17 PM  Result Value Ref Range   Sodium 125 (L) 135 - 145 mmol/L   Potassium 3.1 (L) 3.5 - 5.1 mmol/L    Comment: HEMOLYSIS AT THIS LEVEL MAY AFFECT RESULT   Chloride 83 (L) 98 - 111 mmol/L   CO2 26 22 - 32 mmol/L   Glucose, Bld 206 (H) 70 - 99 mg/dL   BUN 9 8 - 23 mg/dL   Creatinine, Ser 8.11 0.61 - 1.24 mg/dL   Calcium 9.0 8.9 - 91.4 mg/dL   GFR calc non Af Amer >60 >60 mL/min   GFR calc Af Amer >60 >60 mL/min   Anion gap 16 (  H) 5 - 15    Comment: Performed at [facility], [address] [place], [place] [address]  CBC with Differential     Status: Abnormal   Collection Time: 03/25/19  3:17 PM  Result Value Ref Range   WBC 9.4 4.0 - 10.5 K/uL   RBC 5.33 4.22 - 5.81 MIL/uL   Hemoglobin 14.7 13.0 - 17.0 g/dL   HCT 60.4 54.0 - 98.1 %   MCV 79.4 (L) 80.0 - 100.0 fL   MCH 27.6 26.0 - 34.0 pg   MCHC 34.8 30.0 - 36.0 g/dL   RDW 19.1 47.8 - 29.5 %   Platelets 210 150 - 400 K/uL   nRBC 0.0 0.0 - 0.2 %   Neutrophils Relative % 72 %   Neutro Abs 6.7 1.7 - 7.7 K/uL   Lymphocytes Relative 16 %   Lymphs Abs 1.5 0.7 - 4.0 K/uL   Monocytes Relative 8 %   Monocytes Absolute 0.8 0.1 - 1.0 K/uL   Eosinophils Relative 4 %   Eosinophils Absolute 0.4 0.0 - 0.5 K/uL   Basophils Relative 0 %   Basophils Absolute 0.0 0.0 - 0.1 K/uL    Immature Granulocytes 0 %   Abs Immature Granulocytes 0.04 0.00 - 0.07 K/uL    Comment: Performed at [facility] [address] [place], [place] [address]   Ct Head Wo Contrast  Result Date: 03/25/2019 CLINICAL DATA:  Convulsions EXAM: CT HEAD WITHOUT CONTRAST TECHNIQUE: Contiguous axial images were obtained from the base of the skull through the vertex without intravenous contrast. COMPARISON:  MR brain, 01/12/2019, CT brain, 03/09/2016 FINDINGS: Brain: No evidence of acute infarction, hemorrhage, hydrocephalus, extra-axial collection or mass lesion/mass effect. Periventricular and deep white matter hypodensity. Vascular: No hyperdense vessel or unexpected calcification. Skull: Normal. Negative for fracture or focal lesion. Sinuses/Orbits: No acute finding. Other: None. IMPRESSION: No acute intracranial pathology.  Small-vessel white matter disease. Electronically Signed   By: Lauralyn Primes M.D.   On: 03/25/2019 15:40    Pending Labs Unresulted Labs (From admission, onward)    Start     Ordered   03/25/19 1905  TSH  Add-on,   AD     03/25/19 1904   Signed and Held  HIV antibody (Routine Testing)  Once,   R     Signed and Held   Signed and Armed forces training and education officer morning,   R     Signed and Held   Signed and Held  CBC  Tomorrow morning,   R     Signed and Held   Signed and Held  CBC  (heparin)  Once,   R    Comments:  Baseline for heparin therapy IF NOT ALREADY DRAWN.  Notify MD if PLT < 100 K.    Signed and Held   Signed and Held  Creatinine, serum  (heparin)  Once,   R    Comments:  Baseline for heparin therapy IF NOT ALREADY DRAWN.    Signed and Held          Vitals/Pain Today's Vitals   03/25/19 1625 03/25/19 1700 03/25/19 1730 03/25/19 1800  BP:  (!) 139/95 (!) 146/85 128/80  Pulse: 91 81 80 81  Resp: Temp:      TempSrc:      SpO2: 100% 97% 99% 96%  Weight:      Height:      PainSc:  Isolation Precautions No active  isolations  Medications Medications  insulin aspart (novoLOG) injection 0-9 Units (has no administration in time range)  insulin aspart (novoLOG) injection 0-5 Units (has no administration in time range)  0.9 %  sodium chloride infusion (has no administration in time range)  LORazepam (ATIVAN) injection 2 mg (has no administration in time range)  sodium chloride 0.9 % bolus 1,000 mL (1,000 mLs Intravenous Bolus 03/25/19 1626)    Mobility walks Low fall risk   Focused Assessments Neuro Assessment Handoff:   Cardiac Rhythm: Normal sinus rhythm       Neuro [facility]: [facility] Neuro Checks:        R Recommendations: See Admitting Provider Note  Report given to:   Additional Notes:

## 2019-03-25 NOTE — ED Notes (Signed)
Pt does report having a seizure two or three years ago and was seen at [facility].

## 2019-03-25 NOTE — H&P (Signed)
[facility] [place] [facility]   PATIENT NAME: Larry Riggs    MR#:  [id number]  DATE OF BIRTH:  1956/01/24  DATE OF ADMISSION:  03/25/2019  PRIMARY CARE PHYSICIAN: Sherrie MustacheJadali, Fayegh, MD   REQUESTING/REFERRING PHYSICIAN: Derrill KayGoodman  CHIEF COMPLAINT:   Chief Complaint  Patient presents with  . Seizures    HISTORY OF PRESENT ILLNESS: Larry Riggs  is a 63 y.o. male with a known history of -asthma, coronary artery disease, dysphagia, hyperlipidemia, hypertension, obesity, type 2 diabetes-lives in a group home.  Today he was not responding well at group home and they could not find any reason.  Sent him to the emergency room for further evaluation.  Patient was completely alert and oriented during my visit and he also told me in ER physician that he had seizures.  I spoke to patient's brother on the phone who is power of attorney, he is not aware that group home told him regarding any seizure activities.  But as per them he was not responding and they were not sure what was going on.  In ER patient's sodium was noted 125.  CT head was negative.  Given to hospitalist team for further management.  Denies any complaints.  PAST MEDICAL HISTORY:   Past Medical History:  Diagnosis Date  . Asthma   . Coronary atherosclerosis of native coronary vessel    a. 08/2013 Cath: signif 3V dzs w EF of 55%, eval @ [facility] for CABG but felt that there was no good target in the LAD;  b. 12/2013 Cath/PCI: LM nl, LAD 414m (PTCA), 95d (attempted PTCA), subtl distal, D1 95 (2.75x16 Promus DES), D2 90ost, D3 80-90ost, D4 60ost, LCX 90p, small, RCA 658m, PDA nl, RPL1 60, RPL2 90ost.  . Dysphagia   . GERD (gastroesophageal reflux disease)   . Hyperlipidemia   . Hypertension   . Mildly mentally retarded   . Obesity   . Type II diabetes mellitus (HCC)     PAST SURGICAL HISTORY:  Past Surgical History:  Procedure Laterality Date  . CARDIAC CATHETERIZATION  08/2013   [facility]  . CORONARY ANGIOPLASTY WITH  STENT PLACEMENT  12/27/2013   "1"  . ESOPHAGOGASTRODUODENOSCOPY (EGD) WITH PROPOFOL N/A 09/23/2017   Procedure: ESOPHAGOGASTRODUODENOSCOPY (EGD) WITH PROPOFOL WITH DILATION;  Surgeon: [name], Kiran, MD;  [facility];  Service: Gastroenterology;  Laterality: N/A;  . ESOPHAGOGASTRODUODENOSCOPY (EGD) WITH PROPOFOL N/A 12/28/2017   Procedure: ESOPHAGOGASTRODUODENOSCOPY (EGD) WITH PROPOFOL WITH DILATION;  Surgeon: [name], Kiran, MD;  [facility];  Service: Gastroenterology;  Laterality: N/A;  . INGUINAL HERNIA REPAIR Right 1990's  . PERCUTANEOUS CORONARY STENT INTERVENTION (PCI-S) N/A 12/27/2013   Procedure: PERCUTANEOUS CORONARY STENT INTERVENTION (PCI-S);  Surgeon: Iran OuchMuhammad A Arida, MD;  [facility];  Service: Cardiovascular;  Laterality: N/A;    SOCIAL HISTORY:  Social History   Tobacco Use  . Smoking status: Never Smoker  . Smokeless tobacco: Never Used  Substance Use Topics  . Alcohol use: No    FAMILY HISTORY:  Family History  Problem Relation Age of Onset  . Hypertension Father   . Diabetes Father     DRUG ALLERGIES: No Known Allergies  REVIEW OF SYSTEMS:   CONSTITUTIONAL: No fever, fatigue or weakness.  EYES: No blurred or double vision.  EARS, NOSE, AND THROAT: No tinnitus or ear pain.  RESPIRATORY: No cough, shortness of breath, wheezing or hemoptysis.  CARDIOVASCULAR: No chest pain, orthopnea, edema.  GASTROINTESTINAL: No nausea, vomiting, diarrhea or abdominal pain.  GENITOURINARY: No dysuria, hematuria.  ENDOCRINE: No polyuria, nocturia,  HEMATOLOGY: No anemia, easy bruising or bleeding SKIN: No rash or lesion. MUSCULOSKELETAL: No joint pain or arthritis.   NEUROLOGIC: No tingling, numbness, weakness.  PSYCHIATRY: No anxiety or depression.   MEDICATIONS AT HOME:  Prior to Admission medications   Medication Sig Start Date End Date Taking? Authorizing Provider  amLODipine (NORVASC) 2.5 MG tablet Take 1 tablet (2.5 mg total) by mouth  daily. 11/19/15  Yes Iran Ouch, MD  aspirin 81 MG EC tablet Take 81 mg by mouth.   Yes [provider]  atorvastatin (LIPITOR) 80 MG tablet Take 80 mg by mouth daily at 6 PM.   Yes [provider]  clopidogrel (PLAVIX) 75 MG tablet TAKE 1 TABLET DAILY Patient taking differently: Take 75 mg by mouth daily.  03/06/16  Yes Iran Ouch, MD  glipiZIDE (GLUCOTROL XL) 5 MG 24 hr tablet Take 5 mg by mouth 2 (two) times daily.   Yes [provider]  hydrochlorothiazide (HYDRODIURIL) 25 MG tablet Take 25 mg by mouth daily.   Yes [provider]  JANUVIA 100 MG tablet Take 100 mg by mouth daily.  08/05/17  Yes [provider]  lisinopril (PRINIVIL,ZESTRIL) 40 MG tablet Take 40 mg by mouth daily.   Yes [provider]  metFORMIN (GLUCOPHAGE) 1000 MG tablet Take 1 tablet (1,000 mg total) by mouth 2 (two) times daily with a meal. 12/28/13  Yes Creig Hines, NP  metoprolol tartrate (LOPRESSOR) 50 MG tablet  05/25/18  Yes [provider]  pantoprazole (PROTONIX) 40 MG tablet Take 40 mg by mouth daily.   Yes [provider]  potassium chloride (K-DUR) 10 MEQ tablet Take 10 mEq by mouth 2 (two) times a day.   Yes [provider]  risperidone (RISPERDAL) 4 MG tablet Take 4 mg by mouth daily.   Yes [provider]  tamsulosin (FLOMAX) 0.4 MG CAPS capsule Take 0.4 mg by mouth daily after supper.  09/04/17  Yes [provider]  traZODone (DESYREL) 50 MG tablet Take 50 mg by mouth at bedtime.   Yes [provider]  ACCU-CHEK SOFTCLIX LANCETS lancets  09/15/17   [provider]  albuterol (PROAIR HFA) 108 (90 Base) MCG/ACT inhaler Inhale into the lungs.    [provider]  fluticasone (FLOVENT HFA) 220 MCG/ACT inhaler 2 puffs and swallow. Take two (2) times daily after breakfast and bedtime. Rinse mouth after each use. Patient not taking: Reported on 05/26/2018 04/19/18   Wyline Mood, MD      PHYSICAL EXAMINATION:   VITAL SIGNS: Blood pressure 128/80, pulse 81, temperature 98.3 F (36.8 C), temperature source Oral, resp. rate 16, height  (1.727 m), weight 90.7 kg, SpO2 96 %.  GENERAL:  63 y.o.-year-old patient lying in the bed with no acute distress.  EYES: Pupils equal, round, reactive to light and accommodation. No scleral icterus. Extraocular muscles intact.  HEENT: Head atraumatic, normocephalic. Oropharynx and nasopharynx clear.  NECK:  Supple, no jugular venous distention. No thyroid enlargement, no tenderness.  LUNGS: Normal breath sounds bilaterally, no wheezing, rales,rhonchi or crepitation. No use of accessory muscles of respiration.  CARDIOVASCULAR: S1, S2 normal. No murmurs, rubs, or gallops.  ABDOMEN: Soft, nontender, nondistended. Bowel sounds present. No organomegaly or mass.  EXTREMITIES: No pedal edema, cyanosis, or clubbing.  NEUROLOGIC: Cranial nerves II through XII are intact. Muscle strength 5/5 in all extremities. Sensation intact. Gait not checked.  PSYCHIATRIC: The  patient is alert and oriented x 3.  SKIN: No obvious rash, lesion, or ulcer.   LABORATORY PANEL:   CBC Recent Labs  Lab 03/25/19 1517  WBC 9.4  HGB 14.7  HCT 42.3  PLT 210  MCV 79.4*  MCH 27.6  MCHC 34.8  RDW 13.1  LYMPHSABS 1.5  MONOABS 0.8  EOSABS 0.4  BASOSABS 0.0   ------------------------------------------------------------------------------------------------------------------  Chemistries  Recent Labs  Lab 03/25/19 1517  NA 125*  K 3.1*  CL 83*  CO2 26  GLUCOSE 206*  BUN 9  CREATININE 1.09  CALCIUM 9.0   ------------------------------------------------------------------------------------------------------------------ estimated creatinine clearance is 75.8 mL/min (by C-G formula based on SCr of 1.09 mg/dL). ------------------------------------------------------------------------------------------------------------------ No results for  input(s): TSH, T4TOTAL, T3FREE, THYROIDAB in the last 72 hours.  Invalid input(s): FREET3   Coagulation profile No results for input(s): INR, PROTIME in the last 168 hours. ------------------------------------------------------------------------------------------------------------------- No results for input(s): DDIMER in the last 72 hours. -------------------------------------------------------------------------------------------------------------------  Cardiac Enzymes No results for input(s): CKMB, TROPONINI, MYOGLOBIN in the last 168 hours.  Invalid input(s): CK ------------------------------------------------------------------------------------------------------------------ Invalid input(s): POCBNP  ---------------------------------------------------------------------------------------------------------------  Urinalysis    Component Value Date/Time   COLORURINE YELLOW 03/11/2016 2344   APPEARANCEUR CLEAR 03/11/2016 2344   LABSPEC 1.035 (H) 03/11/2016 2344   PHURINE 6.0 03/11/2016 2344   GLUCOSEU >1000 (A) 03/11/2016 2344   HGBUR NEGATIVE 03/11/2016 2344   BILIRUBINUR NEGATIVE 03/11/2016 2344   KETONESUR NEGATIVE 03/11/2016 2344   PROTEINUR NEGATIVE 03/11/2016 2344   NITRITE NEGATIVE 03/11/2016 2344   LEUKOCYTESUR NEGATIVE 03/11/2016 2344     RADIOLOGY: Ct Head Wo Contrast  Result Date: 03/25/2019 CLINICAL DATA:  Convulsions EXAM: CT HEAD WITHOUT CONTRAST TECHNIQUE: Contiguous axial images were obtained from the base of the skull through the vertex without intravenous contrast. COMPARISON:  MR brain, 01/12/2019, CT brain, 03/09/2016 FINDINGS: Brain: No evidence of acute infarction, hemorrhage, hydrocephalus, extra-axial collection or mass lesion/mass effect. Periventricular and deep white matter hypodensity. Vascular: No hyperdense vessel or unexpected calcification. Skull: Normal. Negative for fracture or focal lesion. Sinuses/Orbits: No acute finding. Other: None.  IMPRESSION: No acute intracranial pathology.  Small-vessel white matter disease. Electronically Signed   By: Lauralyn Primes M.D.   On: 03/25/2019 15:40    EKG: Orders placed or performed during the hospital encounter of 03/25/19  . EKG 12-Lead  . EKG 12-Lead    IMPRESSION AND PLAN:  *Seizure versus syncopal episode Monitor on telemetry to rule out arrhythmia. Currently I will not start any antiseizure medication but call neurology consult. We will give Ativan as needed for any further seizure episodes. CT head is negative.  *Hyponatremia Hold hydrochlorothiazide, give gentle IV hydration and monitor tomorrow. Check TSH.  *Diabetes mellitus type 2 Continue home meds and keep on sliding scale coverage.  *Hypertension Continue amlodipine, metoprolol and lisinopril ,hold hydrochlorothiazide.  Continue to monitor.  *Hyperlipidemia Continue atorvastatin.  *Coronary artery disease Continue aspirin Plavix Lipitor and lisinopril, metoprolol.  All the records are reviewed and case discussed with ED provider. Management plans discussed with the patient, family and they are in agreement.  CODE STATUS: Full code Code Status History    Date Active Date Inactive Code Status Order ID Comments User Context   03/10/2016 0151 03/13/2016 2106 Full Code [id number]  SchorrRoma Kayser, NP Inpatient   03/09/2016 1758 03/10/2016 0151 Full Code [id number]  Joseph Art, DO ED   12/27/2013 1616 12/28/2013 1529 Full Code [id number]  Iran Ouch, MD Inpatient       TOTAL  TIME TAKING CARE OF THIS PATIENT: 45 minutes.  Talk to patient's brother on the phone.  Altamese Dilling M.D on 03/25/2019   Between 7am to 6pm - Pager - [phone]  After 6pm go to [email or web address] - password [email or web address]  [facility] [place] [facility]  Office  [phone]  CC: Primary care physician; Sherrie Mustache, MD   Note: This dictation was prepared with Dragon dictation along with smaller phrase technology. Any  transcriptional errors that result from this process are unintentional.

## 2019-03-25 NOTE — ED Notes (Signed)
Pt resting in bed in NAD, watching tv.

## 2019-03-25 NOTE — ED Notes (Signed)
Group home updated with pt's permission. Admitting at bedside.

## 2019-03-25 NOTE — ED Triage Notes (Signed)
Pt comes via [facility] from [address] in [place] (group home). Pt was going back to his room when staff starting noticing convulsions. Pt has no hx of seizures. Pt was post-itcal with staff but was alert and oriented for EMS. Pt has had some tremors with EMS but is on medication for that. Pt cbg was 99. Pt is AOx4 now.

## 2019-03-25 NOTE — ED Notes (Signed)
Pt resting in bed with tv remote and NAD.

## 2019-03-25 NOTE — ED Notes (Signed)
Pt urinated in the bed. Pt sheet, gown, and pads changed. Pt able to assist by rolling easily and lifting bottom.

## 2019-03-25 NOTE — ED Notes (Signed)
Group home number [phone]

## 2019-03-26 ENCOUNTER — Inpatient Hospital Stay: Payer: Medicare Other

## 2019-03-26 DIAGNOSIS — R569 Unspecified convulsions: Principal | ICD-10-CM

## 2019-03-26 LAB — BASIC METABOLIC PANEL
Anion gap: 10 (ref 5–15)
BUN: 6 mg/dL — ABNORMAL LOW (ref 8–23)
CO2: 28 mmol/L (ref 22–32)
Calcium: 8.4 mg/dL — ABNORMAL LOW (ref 8.9–10.3)
Chloride: 89 mmol/L — ABNORMAL LOW (ref 98–111)
Creatinine, Ser: 0.88 mg/dL (ref 0.61–1.24)
GFR calc Af Amer: >60 mL/min (ref >60)
GFR calc non Af Amer: >60 mL/min (ref >60)
Glucose, Bld: 204 mg/dL — ABNORMAL HIGH (ref 70–99)
Potassium: 2.5 mmol/L — CL (ref 3.5–5.1)
Sodium: 127 mmol/L — ABNORMAL LOW (ref 135–145)

## 2019-03-26 LAB — URINALYSIS, COMPLETE (UACMP) WITH MICROSCOPIC
Bacteria, UA: NONE SEEN
Bilirubin Urine: NEGATIVE
Glucose, UA: 50 mg/dL — AB
Ketones, ur: NEGATIVE mg/dL
Leukocytes,Ua: NEGATIVE
Nitrite: NEGATIVE
Protein, ur: NEGATIVE mg/dL
Specific Gravity, Urine: 1.005 (ref 1.005–1.030)
Squamous Epithelial / HPF: NONE SEEN (ref 0–5)
pH: 6 (ref 5.0–8.0)

## 2019-03-26 LAB — URINE DRUG SCREEN, QUALITATIVE (ARMC ONLY)
Amphetamines, Ur Screen: NOT DETECTED
Barbiturates, Ur Screen: NOT DETECTED
Benzodiazepine, Ur Scrn: NOT DETECTED
Cannabinoid 50 Ng, Ur ~~LOC~~: NOT DETECTED
Cocaine Metabolite,Ur ~~LOC~~: NOT DETECTED
MDMA (Ecstasy)Ur Screen: NOT DETECTED
Methadone Scn, Ur: NOT DETECTED
Opiate, Ur Screen: NOT DETECTED
Phencyclidine (PCP) Ur S: NOT DETECTED
Tricyclic, Ur Screen: NOT DETECTED

## 2019-03-26 LAB — CBC
HCT: 40.9 % (ref 39.0–52.0)
Hemoglobin: 14.1 g/dL (ref 13.0–17.0)
MCH: 27.5 pg (ref 26.0–34.0)
MCHC: 34.5 g/dL (ref 30.0–36.0)
MCV: 79.9 fL — ABNORMAL LOW (ref 80.0–100.0)
Platelets: 216 10*3/uL (ref 150–400)
RBC: 5.12 MIL/uL (ref 4.22–5.81)
RDW: 13.1 % (ref 11.5–15.5)
WBC: 8.4 10*3/uL (ref 4.0–10.5)
nRBC: 0 % (ref 0.0–0.2)

## 2019-03-26 LAB — GLUCOSE, CAPILLARY
Glucose-Capillary: 195 mg/dL — ABNORMAL HIGH (ref 70–99)
Glucose-Capillary: 132 mg/dL — ABNORMAL HIGH (ref 70–99)
Glucose-Capillary: 132 mg/dL — ABNORMAL HIGH (ref 70–99)
Glucose-Capillary: 149 mg/dL — ABNORMAL HIGH (ref 70–99)

## 2019-03-26 LAB — POTASSIUM: Potassium: 3.1 mmol/L — ABNORMAL LOW (ref 3.5–5.1)

## 2019-03-26 LAB — OSMOLALITY, URINE: Osmolality, Ur: 223 mOsm/kg — ABNORMAL LOW (ref 300–900)

## 2019-03-26 LAB — OSMOLALITY: Osmolality: 272 mOsm/kg — ABNORMAL LOW (ref 275–295)

## 2019-03-26 LAB — CORTISOL: Cortisol, Plasma: 13.1 ug/dL

## 2019-03-26 LAB — MAGNESIUM: Magnesium: 1.8 mg/dL (ref 1.7–2.4)

## 2019-03-26 MED ORDER — AMLODIPINE BESYLATE 5 MG PO TABS
2.5000 mg | ORAL_TABLET | Freq: Every day | ORAL | Status: DC
  Administered 2019-03-26 – 2019-03-27 (×2): 2.5 mg via ORAL
  Filled 2019-03-26 (×2): qty 1

## 2019-03-26 MED ORDER — RISPERIDONE 3 MG PO TABS
4.0000 mg | ORAL_TABLET | Freq: Every day | ORAL | Status: DC
  Filled 2019-03-26: qty 1

## 2019-03-26 MED ORDER — POTASSIUM CHLORIDE CRYS ER 10 MEQ PO TBCR
10.0000 meq | EXTENDED_RELEASE_TABLET | Freq: Two times a day (BID) | ORAL | Status: DC
  Administered 2019-03-26 (×2): 10 meq via ORAL
  Filled 2019-03-26 (×2): qty 1

## 2019-03-26 MED ORDER — ATORVASTATIN CALCIUM 20 MG PO TABS
80.0000 mg | ORAL_TABLET | Freq: Every day | ORAL | Status: DC
  Administered 2019-03-26 – 2019-03-27 (×2): 80 mg via ORAL
  Filled 2019-03-26 (×2): qty 4

## 2019-03-26 MED ORDER — DOCUSATE SODIUM 100 MG PO CAPS: 100.0000 mg | ORAL_CAPSULE | Freq: Two times a day (BID) | ORAL | Status: DC | PRN

## 2019-03-26 MED ORDER — LINAGLIPTIN 5 MG PO TABS
5.0000 mg | ORAL_TABLET | Freq: Every day | ORAL | Status: DC
  Administered 2019-03-26 – 2019-03-27 (×2): 5 mg via ORAL
  Filled 2019-03-26 (×2): qty 1

## 2019-03-26 MED ORDER — GLIPIZIDE ER 5 MG PO TB24: 5.0000 mg | ORAL_TABLET | Freq: Two times a day (BID) | ORAL | Status: DC

## 2019-03-26 MED ORDER — HEPARIN SODIUM (PORCINE) 5000 UNIT/ML IJ SOLN
5000.0000 [IU] | Freq: Three times a day (TID) | INTRAMUSCULAR | Status: DC
  Administered 2019-03-26 – 2019-03-27 (×3): 5000 [IU] via SUBCUTANEOUS
  Filled 2019-03-26 (×3): qty 1

## 2019-03-26 MED ORDER — PANTOPRAZOLE SODIUM 40 MG PO TBEC
40.0000 mg | DELAYED_RELEASE_TABLET | Freq: Every day | ORAL | Status: DC
  Administered 2019-03-26 – 2019-03-27 (×2): 40 mg via ORAL
  Filled 2019-03-26 (×2): qty 1

## 2019-03-26 MED ORDER — TRAZODONE HCL 50 MG PO TABS
50.0000 mg | ORAL_TABLET | Freq: Every day | ORAL | Status: DC
  Administered 2019-03-26: 21:00:00 50 mg via ORAL
  Filled 2019-03-26: qty 1

## 2019-03-26 MED ORDER — POTASSIUM CHLORIDE 10 MEQ/100ML IV SOLN
10.0000 meq | INTRAVENOUS | Status: AC
  Administered 2019-03-26 (×4): 10 meq via INTRAVENOUS
  Filled 2019-03-26 (×4): qty 100

## 2019-03-26 MED ORDER — TAMSULOSIN HCL 0.4 MG PO CAPS
0.4000 mg | ORAL_CAPSULE | Freq: Every day | ORAL | Status: DC
  Administered 2019-03-26 – 2019-03-27 (×2): 0.4 mg via ORAL
  Filled 2019-03-26 (×2): qty 1

## 2019-03-26 MED ORDER — ALBUTEROL SULFATE (2.5 MG/3ML) 0.083% IN NEBU: 3.0000 mL | INHALATION_SOLUTION | Freq: Four times a day (QID) | RESPIRATORY_TRACT | Status: DC | PRN

## 2019-03-26 MED ORDER — ASPIRIN EC 81 MG PO TBEC
81.0000 mg | DELAYED_RELEASE_TABLET | Freq: Every day | ORAL | Status: DC
  Administered 2019-03-26 – 2019-03-27 (×2): 81 mg via ORAL
  Filled 2019-03-26 (×2): qty 1

## 2019-03-26 MED ORDER — LISINOPRIL 20 MG PO TABS
40.0000 mg | ORAL_TABLET | Freq: Every day | ORAL | Status: DC
  Administered 2019-03-26 – 2019-03-27 (×2): 40 mg via ORAL
  Filled 2019-03-26 (×2): qty 2

## 2019-03-26 MED ORDER — FLUTICASONE PROPIONATE HFA 220 MCG/ACT IN AERO
2.0000 | INHALATION_SPRAY | Freq: Two times a day (BID) | RESPIRATORY_TRACT | Status: DC
  Administered 2019-03-26 (×2): 2 via RESPIRATORY_TRACT
  Filled 2019-03-26: qty 12

## 2019-03-26 MED ORDER — CLOPIDOGREL BISULFATE 75 MG PO TABS
75.0000 mg | ORAL_TABLET | Freq: Every day | ORAL | Status: DC
  Administered 2019-03-26 – 2019-03-27 (×2): 75 mg via ORAL
  Filled 2019-03-26 (×2): qty 1

## 2019-03-26 MED ORDER — METOPROLOL TARTRATE 25 MG PO TABS
12.5000 mg | ORAL_TABLET | Freq: Two times a day (BID) | ORAL | Status: DC
  Administered 2019-03-26 – 2019-03-27 (×3): 12.5 mg via ORAL
  Filled 2019-03-26 (×3): qty 1

## 2019-03-26 NOTE — Progress Notes (Addendum)
[facility] - [place] at [facility]   PATIENT NAME: Larry Riggs    MR#:  [id number]  DATE OF BIRTH:  [birth year]  SUBJECTIVE:   Chief Complaint  Patient presents with  . Seizures  Patient admitted following witnessed syncope and collapse with possible seizure activity.  No new seizures or seizure-like activity reported thus far.  No significant events overnight.  Patient reports is feeling fine. He does not recall events prior to or following the syncope episode.  REVIEW OF SYSTEMS:  Review of Systems  Constitutional: Negative for chills, fever, malaise/fatigue and weight loss.  HENT: Negative for congestion, hearing loss and sore throat.   Eyes: Negative for blurred vision and double vision.  Respiratory: Negative for cough, shortness of breath and wheezing.   Cardiovascular: Negative for chest pain, palpitations, orthopnea and leg swelling.  Gastrointestinal: Negative for abdominal pain, diarrhea, nausea and vomiting.  Genitourinary: Negative for dysuria and urgency.  Musculoskeletal: Negative for myalgias.  Skin: Negative for rash.  Neurological: Positive for tremors and seizures. Negative for dizziness, sensory change, speech change, focal weakness and headaches.  Psychiatric/Behavioral: Positive for depression and memory loss. Negative for substance abuse and suicidal ideas. The patient is nervous/anxious and has insomnia.    DRUG ALLERGIES:  No Known Allergies VITALS:  Blood pressure 137/89, pulse 72, temperature 98.4 F (36.9 C), resp. rate 17, height  (1.727 m), weight 90.7 kg, SpO2 94 %. PHYSICAL EXAMINATION:   GENERAL:  63 y.o.-year-old patient lying in the bed with no acute distress.  EYES: Pupils equal, round, reactive to light and accommodation. No scleral icterus. Extraocular muscles intact.  HEENT: Head atraumatic, normocephalic. Oropharynx and nasopharynx clear.  NECK:  Supple, no jugular venous distention. No thyroid enlargement, no  tenderness.  LUNGS: Normal breath sounds bilaterally, no wheezing, rales,rhonchi or crepitation. No use of accessory muscles of respiration.  CARDIOVASCULAR: S1, S2 normal. No murmurs, rubs, or gallops.  ABDOMEN: Soft, nontender, nondistended. Bowel sounds present. No organomegaly or mass.  EXTREMITIES: No pedal edema, cyanosis, or clubbing.  BUE resting tremors, cogwheeling RUE NEUROLOGIC: Cranial nerves II through XII are intact. Muscle strength 5/5 in all extremities. Sensation intact. Gait not checked.  PSYCHIATRIC: The patient is alert and oriented x 3.  SKIN: No obvious rash, lesion, or ulcer.   DATA REVIEWED:  LABORATORY PANEL:  Male CBC Recent Labs  Lab 03/26/19 0732  WBC 8.4  HGB 14.1  HCT 40.9  PLT 216   ------------------------------------------------------------------------------------------------------------------ Chemistries  Recent Labs  Lab 03/26/19 0732  NA 127*  K 2.5*  CL 89*  CO2 28  GLUCOSE 204*  BUN 6*  CREATININE 0.88  CALCIUM 8.4*   RADIOLOGY:  Ct Head Wo Contrast  Result Date: 03/25/2019 CLINICAL DATA:  Convulsions EXAM: CT HEAD WITHOUT CONTRAST TECHNIQUE: Contiguous axial images were obtained from the base of the skull through the vertex without intravenous contrast. COMPARISON:  MR brain, 01/12/2019, CT brain, 03/09/2016 FINDINGS: Brain: No evidence of acute infarction, hemorrhage, hydrocephalus, extra-axial collection or mass lesion/mass effect. Periventricular and deep white matter hypodensity. Vascular: No hyperdense vessel or unexpected calcification. Skull: Normal. Negative for fracture or focal lesion. Sinuses/Orbits: No acute finding. Other: None. IMPRESSION: No acute intracranial pathology.  Small-vessel white matter disease. Electronically Signed   By: Lauralyn Primes M.D.   On: 03/25/2019 15:40   Mr Brain Wo Contrast  Result Date: 03/26/2019 CLINICAL DATA:  Encephalopathy.  Possible seizure. EXAM: MRI HEAD WITHOUT CONTRAST TECHNIQUE:  Multiplanar, multiecho pulse sequences of the  brain and surrounding structures were obtained without intravenous contrast. COMPARISON:  Head CT 03/25/2019 and MRI 01/12/2019 FINDINGS: Brain: No acute infarct, mass, midline shift, or extra-axial fluid collection is identified. Chronic microhemorrhages are again noted in the posterior left temporal lobe, left insula, and possibly right occipital lobe. Patchy T2 hyperintensities throughout the cerebral white matter and pons are unchanged from the prior MRI and nonspecific but compatible with moderate chronic small vessel ischemic disease. Chronic right larger than left cerebellar infarcts are unchanged as are chronic lacunar infarcts in the pons, thalami, and right basal ganglia. There is moderate cerebral atrophy. The hippocampi are symmetric in size without focal signal abnormality identified. Vascular: Major intracranial vascular flow voids are preserved. Skull and upper cervical spine: Unremarkable bone marrow signal. Sinuses/Orbits: Unremarkable orbits. Mild mucosal thickening in the paranasal sinuses, improved from the prior MRI. Clear mastoid air cells. Other: None. IMPRESSION: 1. No acute intracranial abnormality. 2. 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 Electronically Signed   By: Sebastian Ache M.D.   On: 03/26/2019 09:39   ASSESSMENT AND PLAN:   62 y.o. male with history of CAD, asthma, hyperlipidemia, tension, diabetes mellitus type 2, bipolar disorder, anxiety and depression, mildly mentally retarded, and CVA noted on prior MRI brain presenting with weakness syncope and collapse.  1.  Syncope and collapse -etiology unknown however there were some concerns of possible seizure activity. ? Neurologic - Neurology consulted - MRI of the brain reviewed and shows chronic infarcts but no acute changes - US carotid bilateral pending - EEG pending for  further evaluation of seizures, if unremarkable per neurology no indication for anticonvulsant therapy  ? Cardiac - Echo ordered - Recent EKG shows NSR rate of 61 -Telemetry monitoring shows no arrthymias - If work-up above negative may require cardiology follow-up on an outpatient basis for prolonged cardiac monitoring.   ? Vasovagal  - check Orthostatic Vital Signs  ? Metabolic: No evidence of hypoglycemia, Hypoxia, Shock, Hyperventilation, Anemia, Alcoholic  - Blood sugar QAC/HS - Review CBC - U Tox negative - Reviewed medications (hypoglycemic dosing; sulfonylureas and insulin)  2.  Hyponatremia -initial sodium of 125 slightly improved today to 127 - Holding hydrochlorothiazide -TSH normal - Check cortisol, urine osmolality and serum osmolality - Recheck sodium levels in a.m.  3. Hypokalemia - Levels of 2.5 - Repleted - Recheck levels at 2pm and in the am  4. Diabetes mellitus type II - - check hemoglobin A1c - Continue home meds - Sliding scale coverage  5.  History of CVA -no new strokes - Continue medical management with dual therapy Aspirin 81 mg/day and Plavix 75 mg /day - Continue Lipitor 80 mg with goal LDL <70 mg/dl - Follows with neurology Dr. Cristopher Peru  6.  Drug-induced parkinsonism -mild resting tremors noted in bilateral upper extremities - Follows with neurology as above - Off risperidone  7.  Hypertension -stable -Continue amlodipine, metoprolol and lisinopril -Hold hydrochlorothiazide due to hyponatremia  8.  DVT prophylaxis -heparin subcu   All the records are reviewed and case discussed with Care Management/Social Worker. Management plans discussed with the patient, family and they are in agreement.  CODE STATUS: Full Code  TOTAL TIME TAKING CARE OF THIS PATIENT: 38 minutes.   More than 50% of the time was spent in counseling/coordination of care: YES  POSSIBLE D/C IN 1 DAYS, DEPENDING ON CLINICAL CONDITION.   on 03/26/2019 at 10:59 AM  This patient was staffed with Dr. Willadean CarolKaty,Mayo  who personally evaluated patient, reviewed documentation and agreed with assessment and plan of care as above.  Webb SilversmithElizabeth Thereasa Iannello, DNP, FNP-BC [facility] Nurse Practitioner   Between 7am to 6pm - Pager [phone]- [phone]  After 6pm go to [email or web address] - Social research officer, [email or web address] [email or web address]  [facility] [place] [facility]  Office  [phone]  CC: Primary care physician; Sherrie MustacheJadali, Fayegh, MD  Note: This dictation was prepared with Dragon dictation along with smaller phrase technology. Any transcriptional errors that result from this process are unintentional.

## 2019-03-26 NOTE — Consult Note (Signed)
Reason for Consult:Episode of unresponsiveness Referring Physician: Mayo  CC: Episode of unresponsiveness  HPI: Larry Riggs is an 63 y.o. male with a history of HTN and DM who is a resident of a group home.  History is conflicting.  One report is that the patient was unresponsive.  Another report is that the patient had convulsive activity.  EMS was called and patient was at baseline when they arrived.  Patient admitted for further evaluation.    Past Medical History:  Diagnosis Date  . Asthma   . Coronary atherosclerosis of native coronary vessel    a. 08/2013 Cath: signif 3V dzs w EF of 55%, eval @ [facility] for CABG but felt that there was no good target in the LAD;  b. 12/2013 Cath/PCI: LM nl, LAD 26m (PTCA), 95d (attempted PTCA), subtl distal, D1 95 (2.75x16 Promus DES), D2 90ost, D3 80-90ost, D4 60ost, LCX 90p, small, RCA 72m, PDA nl, RPL1 60, RPL2 90ost.  . Dysphagia   . GERD (gastroesophageal reflux disease)   . Hyperlipidemia   . Hypertension   . Mildly mentally retarded   . Obesity   . Type II diabetes mellitus (HCC)     Past Surgical History:  Procedure Laterality Date  . CARDIAC CATHETERIZATION  08/2013   [facility]  . CORONARY ANGIOPLASTY WITH STENT PLACEMENT  12/27/2013   "1"  . ESOPHAGOGASTRODUODENOSCOPY (EGD) WITH PROPOFOL N/A 09/23/2017   Procedure: ESOPHAGOGASTRODUODENOSCOPY (EGD) WITH PROPOFOL WITH DILATION;  Surgeon: Wyline Mood, MD;  Location: [facility] ENDOSCOPY;  Service: Gastroenterology;  Laterality: N/A;  . ESOPHAGOGASTRODUODENOSCOPY (EGD) WITH PROPOFOL N/A 12/28/2017   Procedure: ESOPHAGOGASTRODUODENOSCOPY (EGD) WITH PROPOFOL WITH DILATION;  Surgeon: Wyline Mood, MD;  Location: [facility] ENDOSCOPY;  Service: Gastroenterology;  Laterality: N/A;  . INGUINAL HERNIA REPAIR Right 1990's  . PERCUTANEOUS CORONARY STENT INTERVENTION (PCI-S) N/A 12/27/2013   Procedure: PERCUTANEOUS CORONARY STENT INTERVENTION (PCI-S);  Surgeon: Iran Ouch, MD;  Location: [facility] CATH LAB;  Service:  Cardiovascular;  Laterality: N/A;    Family History  Problem Relation Age of Onset  . Hypertension Father   . Diabetes Father     Social History:  reports that he has never smoked. He has never used smokeless tobacco. He reports that he does not drink alcohol or use drugs.  No Known Allergies  Medications:  I have reviewed the patient's current medications. Prior to Admission:  Medications Prior to Admission  Medication Sig Dispense Refill Last Dose  . amLODipine (NORVASC) 2.5 MG tablet Take 1 tablet (2.5 mg total) by mouth daily. 90 tablet 3 03/25/2019 at 0800  . aspirin 81 MG EC tablet Take 81 mg by mouth.   03/25/2019 at 0800  . atorvastatin (LIPITOR) 80 MG tablet Take 80 mg by mouth daily at 6 PM.   03/24/2019 at 1800  . clopidogrel (PLAVIX) 75 MG tablet TAKE 1 TABLET DAILY (Patient taking differently: Take 75 mg by mouth daily. ) 90 tablet 3 03/25/2019 at 0800  . glipiZIDE (GLUCOTROL XL) 5 MG 24 hr tablet Take 5 mg by mouth 2 (two) times daily.   03/25/2019 at 0800  . hydrochlorothiazide (HYDRODIURIL) 25 MG tablet Take 25 mg by mouth daily.   03/25/2019 at 0800  . JANUVIA 100 MG tablet Take 100 mg by mouth daily.    03/25/2019 at 0800  . lisinopril (PRINIVIL,ZESTRIL) 40 MG tablet Take 40 mg by mouth daily.   03/25/2019 at 0800  . metFORMIN (GLUCOPHAGE) 1000 MG tablet Take 1 tablet (1,000 mg total) by mouth 2 (two)  times daily with a meal.   03/25/2019 at 0800  . metoprolol tartrate (LOPRESSOR) 50 MG tablet    03/25/2019 at 0800  . pantoprazole (PROTONIX) 40 MG tablet Take 40 mg by mouth daily.   03/25/2019 at 0800  . potassium chloride (K-DUR) 10 MEQ tablet Take 10 mEq by mouth 2 (two) times a day.   03/25/2019 at 0800  . risperidone (RISPERDAL) 4 MG tablet Take 4 mg by mouth daily.   03/24/2019 at 2000  . tamsulosin (FLOMAX) 0.4 MG CAPS capsule Take 0.4 mg by mouth daily after supper.    03/24/2019 at 1800  . traZODone (DESYREL) 50 MG tablet Take 50 mg by mouth at bedtime.   03/24/2019 at 2000  . ACCU-CHEK  SOFTCLIX LANCETS lancets    Taking  . albuterol (PROAIR HFA) 108 (90 Base) MCG/ACT inhaler Inhale into the lungs.   prn at prn  . fluticasone (FLOVENT HFA) 220 MCG/ACT inhaler 2 puffs and swallow. Take two (2) times daily after breakfast and bedtime. Rinse mouth after each use. (Patient not taking: Reported on 05/26/2018) 1 Inhaler 0 Not Taking   Scheduled: . amLODipine  2.5 mg Oral Daily  . aspirin EC  81 mg Oral Daily  . atorvastatin  80 mg Oral q1800  . clopidogrel  75 mg Oral Daily  . fluticasone  2 puff Inhalation BID  . heparin  5,000 Units Subcutaneous Q8H  . insulin aspart  0-5 Units Subcutaneous QHS  . insulin aspart  0-9 Units Subcutaneous TID WC  . linagliptin  5 mg Oral Daily  . lisinopril  40 mg Oral Daily  . metoprolol tartrate  12.5 mg Oral BID  . pantoprazole  40 mg Oral Daily  . potassium chloride  10 mEq Oral BID  . tamsulosin  0.4 mg Oral QPC supper  . traZODone  50 mg Oral QHS    ROS: History obtained from the patient  General ROS: negative for - chills, fatigue, fever, night sweats, weight gain or weight loss Psychological ROS: negative for - behavioral disorder, hallucinations, memory difficulties, mood swings or suicidal ideation Ophthalmic ROS: negative for - blurry vision, double vision, eye pain or loss of vision ENT ROS: negative for - epistaxis, nasal discharge, oral lesions, sore throat, tinnitus or vertigo Allergy and Immunology ROS: negative for - hives or itchy/watery eyes Hematological and Lymphatic ROS: negative for - bleeding problems, bruising or swollen lymph nodes Endocrine ROS: negative for - galactorrhea, hair pattern changes, polydipsia/polyuria or temperature intolerance Respiratory ROS: negative for - cough, hemoptysis, shortness of breath or wheezing Cardiovascular ROS: negative for - chest pain, dyspnea on exertion, edema or irregular heartbeat Gastrointestinal ROS: negative for - abdominal pain, diarrhea, hematemesis, nausea/vomiting or  stool incontinence Genito-Urinary ROS: negative for - dysuria, hematuria, incontinence or urinary frequency/urgency Musculoskeletal ROS: negative for - joint swelling or muscular weakness Neurological ROS: as noted in HPI Dermatological ROS: negative for rash and skin lesion changes  Physical Examination: Blood pressure 137/89, pulse 72, temperature 98.4 F (36.9 C), resp. rate 17, height 5\' 8"  (1.727 m), weight 90.7 kg, SpO2 94 %.  HEENT-  Normocephalic, no lesions, without obvious abnormality.  Normal external eye and conjunctiva.  Normal TM's bilaterally.  Normal auditory canals and external ears. Normal external nose, mucus membranes and septum.  Normal pharynx.  Tongue with apparent bruising on the left side but no swelling and no pain Cardiovascular- S1, S2 normal, pulses palpable throughout   Lungs- chest clear, no wheezing, rales, normal symmetric air  entry Abdomen- soft, non-tender; bowel sounds normal; no masses,  no organomegaly Extremities- no edema Lymph-no adenopathy palpable Musculoskeletal-no joint tenderness, deformity or swelling Skin-warm and dry, no hyperpigmentation, vitiligo, or suspicious lesions  Neurological Examination   Mental Status: Alert, oriented, thought content appropriate.  Speech fluent without evidence of aphasia.  Able to follow 3 step commands with reinforcement. Cranial Nerves: II: Discs flat bilaterally; Visual fields grossly normal, pupils equal, round, reactive to light and accommodation III,IV, VI: ptosis not present, extra-ocular motions intact bilaterally V,VII: smile symmetric, facial light touch sensation normal bilaterally VIII: hearing normal bilaterally IX,X: gag reflex present XI: bilateral shoulder shrug XII: midline tongue extension Motor: Right : Upper extremity   5/5    Left:     Upper extremity   5/5  Lower extremity   5/5     Lower extremity   5/5 Tremor noted in the BUE's Sensory: Pinprick and light touch intact throughout,  bilaterally Deep Tendon Reflexes: Symmetric throughout Plantars: Right: mute   Left: mute Cerebellar: Normal finger-to-nose and normal heel-to-shin testing bilaterally Gait: not tested due to safety concerns  Laboratory Studies:   Basic Metabolic Panel: Recent Labs  Lab 03/25/19 1517 03/26/19 0732  NA 125* 127*  K 3.1* 2.5*  CL 83* 89*  CO2 26 28  GLUCOSE 206* 204*  BUN 9 6*  CREATININE 1.09 0.88  CALCIUM 9.0 8.4*    Liver Function Tests: No results for input(s): AST, ALT, ALKPHOS, BILITOT, PROT, ALBUMIN in the last 168 hours. No results for input(s): LIPASE, AMYLASE in the last 168 hours. No results for input(s): AMMONIA in the last 168 hours.  CBC: Recent Labs  Lab 03/25/19 1517 03/26/19 0732  WBC 9.4 8.4  NEUTROABS 6.7  --   HGB 14.7 14.1  HCT 42.3 40.9  MCV 79.4* 79.9*  PLT 210 216    Cardiac Enzymes: No results for input(s): CKTOTAL, CKMB, CKMBINDEX, TROPONINI in the last 168 hours.  BNP: Invalid input(s): POCBNP  CBG: Recent Labs  Lab 03/25/19 2031 03/26/19 0748  GLUCAP 193* 195*    Microbiology: Results for orders placed or performed during the hospital encounter of 03/25/19  SARS Coronavirus 2 [facility] order, Performed in [facility] hospital lab)     Status: None   Collection Time: 03/25/19  3:15 PM  Result Value Ref Range Status   SARS Coronavirus 2 NEGATIVE NEGATIVE Final    Comment: (NOTE) If result is NEGATIVE SARS-CoV-2 target nucleic acids are NOT DETECTED. The SARS-CoV-2 RNA is generally detectable in upper and lower  respiratory specimens during the acute phase of infection. The lowest  concentration of SARS-CoV-2 viral copies this assay can detect is 250  copies / mL. A negative result does not preclude SARS-CoV-2 infection  and should not be used as the sole basis for treatment or other  patient management decisions.  A negative result may occur with  improper specimen collection / handling, submission of specimen other  than  nasopharyngeal swab, presence of viral mutation(s) within the  areas targeted by this assay, and inadequate number of viral copies  (<250 copies / mL). A negative result must be combined with clinical  observations, patient history, and epidemiological information. If result is POSITIVE SARS-CoV-2 target nucleic acids are DETECTED. The SARS-CoV-2 RNA is generally detectable in upper and lower  respiratory specimens dur ing the acute phase of infection.  Positive  results are indicative of active infection with SARS-CoV-2.  Clinical  correlation with patient history and other diagnostic information  is  necessary to determine patient infection status.  Positive results do  not rule out bacterial infection or co-infection with other viruses. If result is PRESUMPTIVE POSTIVE SARS-CoV-2 nucleic acids MAY BE PRESENT.   A presumptive positive result was obtained on the submitted specimen  and confirmed on repeat testing.  While 2019 novel coronavirus  (SARS-CoV-2) nucleic acids may be present in the submitted sample  additional confirmatory testing may be necessary for epidemiological  and / or clinical management purposes  to differentiate between  SARS-CoV-2 and other Sarbecovirus currently known to infect humans.  If clinically indicated additional testing with an alternate test  methodology [phone]) is advised. The SARS-CoV-2 RNA is generally  detectable in upper and lower respiratory sp ecimens during the acute  phase of infection. The expected result is Negative. Fact Sheet for Patients:  BoilerBrush.com.cy Fact Sheet for Healthcare Providers: https://pope.com/ This test is not yet approved or cleared by the [place] FDA and has been authorized for detection and/or diagnosis of SARS-CoV-2 by FDA under an Emergency Use Authorization (EUA).  This EUA will remain in effect (meaning this test can be used) for the duration of  the COVID-19 declaration under Section 564(b)(1) of the Act, 21 U.S.C. section 360bbb-3(b)(1), unless the authorization is terminated or revoked sooner. Performed at [facility], [address] [place], [place] [address]     Coagulation Studies: No results for input(s): LABPROT, INR in the last 72 hours.  Urinalysis:  Recent Labs  Lab 03/26/19 0839  COLORURINE STRAW*  LABSPEC 1.005  PHURINE 6.0  GLUCOSEU 50*  HGBUR SMALL*  BILIRUBINUR NEGATIVE  KETONESUR NEGATIVE  PROTEINUR NEGATIVE  NITRITE NEGATIVE  LEUKOCYTESUR NEGATIVE    Lipid Panel:     Component Value Date/Time   CHOL 243 (H) 03/12/2016 0309   TRIG 181 (H) 03/12/2016 0309   HDL 33 (L) 03/12/2016 0309   CHOLHDL 7.4 03/12/2016 0309   VLDL 36 03/12/2016 0309   LDLCALC 174 (H) 03/12/2016 0309    HgbA1C:  Lab Results  Component Value Date   HGBA1C 13.7 (H) 03/10/2016    Urine Drug Screen:      Component Value Date/Time   LABOPIA NONE DETECTED 03/26/2019 0839   COCAINSCRNUR NONE DETECTED 03/26/2019 0839   LABBENZ NONE DETECTED 03/26/2019 0839   AMPHETMU NONE DETECTED 03/26/2019 0839   THCU NONE DETECTED 03/26/2019 0839   LABBARB NONE DETECTED 03/26/2019 0839    Alcohol Level: No results for input(s): ETH in the last 168 hours.  Other results: EKG: sinus rhythm at 87 bpm.  Imaging: Ct Head Wo Contrast  Result Date: 03/25/2019 CLINICAL DATA:  Convulsions EXAM: CT HEAD WITHOUT CONTRAST TECHNIQUE: Contiguous axial images were obtained from the base of the skull through the vertex without intravenous contrast. COMPARISON:  MR brain, 01/12/2019, CT brain, 03/09/2016 FINDINGS: Brain: No evidence of acute infarction, hemorrhage, hydrocephalus, extra-axial collection or mass lesion/mass effect. Periventricular and deep white matter hypodensity. Vascular: No hyperdense vessel or unexpected calcification. Skull: Normal. Negative for fracture or focal lesion. Sinuses/Orbits: No acute finding. Other:  None. IMPRESSION: No acute intracranial pathology.  Small-vessel white matter disease. Electronically Signed   By: Lauralyn Primes M.D.   On: 03/25/2019 15:40   Mr Brain Wo Contrast  Result Date: 03/26/2019 CLINICAL DATA:  Encephalopathy.  Possible seizure. EXAM: MRI HEAD WITHOUT CONTRAST TECHNIQUE: Multiplanar, multiecho pulse sequences of the brain and surrounding structures were obtained without intravenous contrast. COMPARISON:  Head CT 03/25/2019 and MRI 01/12/2019 FINDINGS: Brain: No acute  infarct, mass, midline shift, or extra-axial fluid collection is identified. Chronic microhemorrhages are again noted in the posterior left temporal lobe, left insula, and possibly right occipital lobe. Patchy T2 hyperintensities throughout the cerebral white matter and pons are unchanged from the prior MRI and nonspecific but compatible with moderate chronic small vessel ischemic disease. Chronic right larger than left cerebellar infarcts are unchanged as are chronic lacunar infarcts in the pons, thalami, and right basal ganglia. There is moderate cerebral atrophy. The hippocampi are symmetric in size without focal signal abnormality identified. Vascular: Major intracranial vascular flow voids are preserved. Skull and upper cervical spine: Unremarkable bone marrow signal. Sinuses/Orbits: Unremarkable orbits. Mild mucosal thickening in the paranasal sinuses, improved from the prior MRI. Clear mastoid air cells. Other: None. IMPRESSION: 1. No acute intracranial abnormality. 2. 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 Electronically Signed   By: Sebastian Ache M.D.   On: 03/26/2019 09:39     Assessment/Plan: 63 year old male presenting with possible seizure-like activity.  Lab work reveals hyponatremia.  MRI of the brain reviewed and shows multiple chronic infarcts but no acute changes.  Patient on ASA, Plavix and a  statin.  Unclear if this was actually a seizure and/or if it may have been provoked.  Further work up recommended.    Recommendations: 1. Orthostatic vitals 2. EEG pending.  If unremarkable, anticonvulsant therapy not indicated at this time.   3. Agree with addressing metabolic abnormalities.   4. Seizure precautions  Thana Farr, MD Neurology [phone] 03/26/2019, 10:29 AM

## 2019-03-27 ENCOUNTER — Other Ambulatory Visit: Payer: Medicare Other

## 2019-03-27 ENCOUNTER — Inpatient Hospital Stay
Admit: 2019-03-27 | Discharge: 2019-03-27 | Disposition: A | Discharge status: Home / Self Care | Payer: Medicare Other | Attending: Nurse Practitioner | Admitting: Nurse Practitioner

## 2019-03-27 LAB — BASIC METABOLIC PANEL
Anion gap: 6 (ref 5–15)
BUN: 9 mg/dL (ref 8–23)
CO2: 27 mmol/L (ref 22–32)
Calcium: 8.5 mg/dL — ABNORMAL LOW (ref 8.9–10.3)
Chloride: 100 mmol/L (ref 98–111)
Creatinine, Ser: 0.84 mg/dL (ref 0.61–1.24)
GFR calc Af Amer: >60 mL/min (ref >60)
GFR calc non Af Amer: >60 mL/min (ref >60)
Glucose, Bld: 163 mg/dL — ABNORMAL HIGH (ref 70–99)
Potassium: 3.2 mmol/L — ABNORMAL LOW (ref 3.5–5.1)
Sodium: 133 mmol/L — ABNORMAL LOW (ref 135–145)

## 2019-03-27 LAB — ECHOCARDIOGRAM COMPLETE
Height: 68 in
Weight: 3200 oz

## 2019-03-27 LAB — MAGNESIUM: Magnesium: 2 mg/dL (ref 1.7–2.4)

## 2019-03-27 LAB — GLUCOSE, CAPILLARY
Glucose-Capillary: 174 mg/dL — ABNORMAL HIGH (ref 70–99)
Glucose-Capillary: 179 mg/dL — ABNORMAL HIGH (ref 70–99)

## 2019-03-27 LAB — HEMOGLOBIN A1C
Hgb A1c MFr Bld: 6.6 % — ABNORMAL HIGH (ref 4.8–5.6)
Mean Plasma Glucose: 142.72 mg/dL

## 2019-03-27 MED ORDER — POTASSIUM CHLORIDE CRYS ER 20 MEQ PO TBCR
40.0000 meq | EXTENDED_RELEASE_TABLET | Freq: Once | ORAL | Status: AC
  Administered 2019-03-27: 40 meq via ORAL
  Filled 2019-03-27: qty 2

## 2019-03-27 MED ORDER — POTASSIUM CHLORIDE 10 MEQ/100ML IV SOLN
10.0000 meq | INTRAVENOUS | Status: AC
  Administered 2019-03-27 (×2): 10 meq via INTRAVENOUS
  Filled 2019-03-27 (×2): qty 100

## 2019-03-27 NOTE — Progress Notes (Signed)
Subjective: Pt is close to baseline no further seizure type of activity  Past Medical History:  Diagnosis Date  . Asthma   . Coronary atherosclerosis of native coronary vessel    a. 08/2013 Cath: signif 3V dzs w EF of 55%, eval @ [facility] for CABG but felt that there was no good target in the LAD;  b. 12/2013 Cath/PCI: LM nl, LAD 337m (PTCA), 95d (attempted PTCA), subtl distal, D1 95 (2.75x16 Promus DES), D2 90ost, D3 80-90ost, D4 60ost, LCX 90p, small, RCA 2574m, PDA nl, RPL1 60, RPL2 90ost.  . Dysphagia   . GERD (gastroesophageal reflux disease)   . Hyperlipidemia   . Hypertension   . Mildly mentally retarded   . Obesity   . Type II diabetes mellitus (HCC)     Past Surgical History:  Procedure Laterality Date  . CARDIAC CATHETERIZATION  08/2013   [facility]  . CORONARY ANGIOPLASTY WITH STENT PLACEMENT  12/27/2013   "1"  . ESOPHAGOGASTRODUODENOSCOPY (EGD) WITH PROPOFOL N/A 09/23/2017   Procedure: ESOPHAGOGASTRODUODENOSCOPY (EGD) WITH PROPOFOL WITH DILATION;  Surgeon: [name], Kiran, MD;  [facility];  Service: Gastroenterology;  Laterality: N/A;  . ESOPHAGOGASTRODUODENOSCOPY (EGD) WITH PROPOFOL N/A 12/28/2017   Procedure: ESOPHAGOGASTRODUODENOSCOPY (EGD) WITH PROPOFOL WITH DILATION;  Surgeon: [name], Kiran, MD;  [facility];  Service: Gastroenterology;  Laterality: N/A;  . INGUINAL HERNIA REPAIR Right 1990's  . PERCUTANEOUS CORONARY STENT INTERVENTION (PCI-S) N/A 12/27/2013   Procedure: PERCUTANEOUS CORONARY STENT INTERVENTION (PCI-S);  Surgeon: Iran OuchMuhammad A Arida, MD;  [facility];  Service: Cardiovascular;  Laterality: N/A;    Family History  Problem Relation Age of Onset  . Hypertension Father   . Diabetes Father     Social History:  reports that he has never smoked. He has never used smokeless tobacco. He reports that he does not drink alcohol or use drugs.  No Known Allergies  Medications:  I have reviewed the patient's current medications. Prior to  Admission:  Medications Prior to Admission  Medication Sig Dispense Refill Last Dose  . amLODipine (NORVASC) 2.5 MG tablet Take 1 tablet (2.5 mg total) by mouth daily. 90 tablet 3 03/25/2019 at 0800  . aspirin 81 MG EC tablet Take 81 mg by mouth.   03/25/2019 at 0800  . atorvastatin (LIPITOR) 80 MG tablet Take 80 mg by mouth daily at 6 PM.   03/24/2019 at 1800  . clopidogrel (PLAVIX) 75 MG tablet TAKE 1 TABLET DAILY (Patient taking differently: Take 75 mg by mouth daily. ) 90 tablet 3 03/25/2019 at 0800  . glipiZIDE (GLUCOTROL XL) 5 MG 24 hr tablet Take 5 mg by mouth 2 (two) times daily.   03/25/2019 at 0800  . hydrochlorothiazide (HYDRODIURIL) 25 MG tablet Take 25 mg by mouth daily.   03/25/2019 at 0800  . JANUVIA 100 MG tablet Take 100 mg by mouth daily.    03/25/2019 at 0800  . lisinopril (PRINIVIL,ZESTRIL) 40 MG tablet Take 40 mg by mouth daily.   03/25/2019 at 0800  . metFORMIN (GLUCOPHAGE) 1000 MG tablet Take 1 tablet (1,000 mg total) by mouth 2 (two) times daily with a meal.   03/25/2019 at 0800  . metoprolol tartrate (LOPRESSOR) 50 MG tablet    03/25/2019 at 0800  . pantoprazole (PROTONIX) 40 MG tablet Take 40 mg by mouth daily.   03/25/2019 at 0800  . potassium chloride (K-DUR) 10 MEQ tablet Take 10 mEq by mouth 2 (two) times a day.   03/25/2019 at 0800  . risperidone (RISPERDAL)  4 MG tablet Take 4 mg by mouth daily.   03/24/2019 at 2000  . tamsulosin (FLOMAX) 0.4 MG CAPS capsule Take 0.4 mg by mouth daily after supper.    03/24/2019 at 1800  . traZODone (DESYREL) 50 MG tablet Take 50 mg by mouth at bedtime.   03/24/2019 at 2000  . ACCU-CHEK SOFTCLIX LANCETS lancets    Taking  . albuterol (PROAIR HFA) 108 (90 Base) MCG/ACT inhaler Inhale into the lungs.   prn at prn  . fluticasone (FLOVENT HFA) 220 MCG/ACT inhaler 2 puffs and swallow. Take two (2) times daily after breakfast and bedtime. Rinse mouth after each use. (Patient not taking: Reported on 05/26/2018) 1 Inhaler 0 Not Taking   Scheduled: . amLODipine  2.5 mg  Oral Daily  . aspirin EC  81 mg Oral Daily  . atorvastatin  80 mg Oral q1800  . clopidogrel  75 mg Oral Daily  . fluticasone  2 puff Inhalation BID  . heparin  5,000 Units Subcutaneous Q8H  . insulin aspart  0-5 Units Subcutaneous QHS  . insulin aspart  0-9 Units Subcutaneous TID WC  . linagliptin  5 mg Oral Daily  . lisinopril  40 mg Oral Daily  . metoprolol tartrate  12.5 mg Oral BID  . pantoprazole  40 mg Oral Daily  . tamsulosin  0.4 mg Oral QPC supper  . traZODone  50 mg Oral QHS     Physical Examination: Blood pressure (!) 176/100, pulse 91, temperature 97.8 F (36.6 C), temperature source Oral, resp. rate 18, height 5\' 8"  (1.727 m), weight 90.7 kg, SpO2 96 %.  HEENT-  Normocephalic, no lesions, without obvious abnormality.  Normal external eye and conjunctiva.  Normal TM's bilaterally.  Normal auditory canals and external ears. Normal external nose, mucus membranes and septum.  Normal pharynx.  Tongue with apparent bruising on the left side but no swelling and no pain Cardiovascular- S1, S2 normal, pulses palpable throughout   Lungs- chest clear, no wheezing, rales, normal symmetric air entry Abdomen- soft, non-tender; bowel sounds normal; no masses,  no organomegaly Extremities- no edema Lymph-no adenopathy palpable Musculoskeletal-no joint tenderness, deformity or swelling Skin-warm and dry, no hyperpigmentation, vitiligo, or suspicious lesions  Neurological Examination   Mental Status: Alert, oriented, thought content appropriate.  Speech fluent without evidence of aphasia.  Able to follow 3 step commands with reinforcement. Cranial Nerves: II: Discs flat bilaterally; Visual fields grossly normal, pupils equal, round, reactive to light and accommodation III,IV, VI: ptosis not present, extra-ocular motions intact bilaterally V,VII: smile symmetric, facial light touch sensation normal bilaterally VIII: hearing normal bilaterally IX,X: gag reflex present XI: bilateral  shoulder shrug XII: midline tongue extension Motor: Right : Upper extremity   5/5    Left:     Upper extremity   5/5  Lower extremity   5/5     Lower extremity   5/5 Tremor noted in the BUE's Sensory: Pinprick and light touch intact throughout, bilaterally Deep Tendon Reflexes: Symmetric throughout Plantars: Right: mute   Left: mute Cerebellar: Normal finger-to-nose and normal heel-to-shin testing bilaterally Gait: not tested due to safety concerns  Laboratory Studies:   Basic Metabolic Panel: Recent Labs  Lab 03/25/19 1517 03/26/19 0732 03/26/19 1419 03/27/19 0426  NA 125* 127*  --  133*  K 3.1* 2.5* 3.1* 3.2*  CL 83* 89*  --  100  CO2 26 28  --  27  GLUCOSE 206* 204*  --  163*  BUN 9 6*  --  9  CREATININE 1.09 0.88  --  0.84  CALCIUM 9.0 8.4*  --  8.5*  MG  --   --  1.8 2.0    Liver Function Tests: No results for input(s): AST, ALT, ALKPHOS, BILITOT, PROT, ALBUMIN in the last 168 hours. No results for input(s): LIPASE, AMYLASE in the last 168 hours. No results for input(s): AMMONIA in the last 168 hours.  CBC: Recent Labs  Lab 03/25/19 1517 03/26/19 0732  WBC 9.4 8.4  NEUTROABS 6.7  --   HGB 14.7 14.1  HCT 42.3 40.9  MCV 79.4* 79.9*  PLT 210 216    Cardiac Enzymes: No results for input(s): CKTOTAL, CKMB, CKMBINDEX, TROPONINI in the last 168 hours.  BNP: Invalid input(s): POCBNP  CBG: Recent Labs  Lab 03/26/19 0748 03/26/19 1147 03/26/19 1630 03/26/19 2050 03/27/19 0741  GLUCAP 195* 132* 132* 149* 174*    Microbiology: Results for orders placed or performed during the hospital encounter of 03/25/19  SARS Coronavirus 2 [facility] order, Performed in [facility] hospital lab)     Status: None   Collection Time: 03/25/19  3:15 PM  Result Value Ref Range Status   SARS Coronavirus 2 NEGATIVE NEGATIVE Final    Comment: (NOTE) If result is NEGATIVE SARS-CoV-2 target nucleic acids are NOT DETECTED. The SARS-CoV-2 RNA is generally detectable in  upper and lower  respiratory specimens during the acute phase of infection. The lowest  concentration of SARS-CoV-2 viral copies this assay can detect is 250  copies / mL. A negative result does not preclude SARS-CoV-2 infection  and should not be used as the sole basis for treatment or other  patient management decisions.  A negative result may occur with  improper specimen collection / handling, submission of specimen other  than nasopharyngeal swab, presence of viral mutation(s) within the  areas targeted by this assay, and inadequate number of viral copies  (<250 copies / mL). A negative result must be combined with clinical  observations, patient history, and epidemiological information. If result is POSITIVE SARS-CoV-2 target nucleic acids are DETECTED. The SARS-CoV-2 RNA is generally detectable in upper and lower  respiratory specimens dur ing the acute phase of infection.  Positive  results are indicative of active infection with SARS-CoV-2.  Clinical  correlation with patient history and other diagnostic information is  necessary to determine patient infection status.  Positive results do  not rule out bacterial infection or co-infection with other viruses. If result is PRESUMPTIVE POSTIVE SARS-CoV-2 nucleic acids MAY BE PRESENT.   A presumptive positive result was obtained on the submitted specimen  and confirmed on repeat testing.  While 2019 novel coronavirus  (SARS-CoV-2) nucleic acids may be present in the submitted sample  additional confirmatory testing may be necessary for epidemiological  and / or clinical management purposes  to differentiate between  SARS-CoV-2 and other Sarbecovirus currently known to infect humans.  If clinically indicated additional testing with an alternate test  methodology [phone]) is advised. The SARS-CoV-2 RNA is generally  detectable in upper and lower respiratory sp ecimens during the acute  phase of infection. The expected result is  Negative. Fact Sheet for Patients:  BoilerBrush.com.cy Fact Sheet for Healthcare Providers: https://pope.com/ This test is not yet approved or cleared by the [place] FDA and has been authorized for detection and/or diagnosis of SARS-CoV-2 by FDA under an Emergency Use Authorization (EUA).  This EUA will remain in effect (meaning this test can be used) for the duration of the COVID-19  declaration under Section 564(b)(1) of the Act, 21 U.S.C. section 360bbb-3(b)(1), unless the authorization is terminated or revoked sooner. Performed at [facility], [address] [place], [place] [address]     Coagulation Studies: No results for input(s): LABPROT, INR in the last 72 hours.  Urinalysis:  Recent Labs  Lab 03/26/19 0839  COLORURINE STRAW*  LABSPEC 1.005  PHURINE 6.0  GLUCOSEU 50*  HGBUR SMALL*  BILIRUBINUR NEGATIVE  KETONESUR NEGATIVE  PROTEINUR NEGATIVE  NITRITE NEGATIVE  LEUKOCYTESUR NEGATIVE    Lipid Panel:     Component Value Date/Time   CHOL 243 (H) 03/12/2016 0309   TRIG 181 (H) 03/12/2016 0309   HDL 33 (L) 03/12/2016 0309   CHOLHDL 7.4 03/12/2016 0309   VLDL 36 03/12/2016 0309   LDLCALC 174 (H) 03/12/2016 0309    HgbA1C:  Lab Results  Component Value Date   HGBA1C 13.7 (H) 03/10/2016    Urine Drug Screen:      Component Value Date/Time   LABOPIA NONE DETECTED 03/26/2019 0839   COCAINSCRNUR NONE DETECTED 03/26/2019 0839   LABBENZ NONE DETECTED 03/26/2019 0839   AMPHETMU NONE DETECTED 03/26/2019 0839   THCU NONE DETECTED 03/26/2019 0839   LABBARB NONE DETECTED 03/26/2019 0839    Alcohol Level: No results for input(s): ETH in the last 168 hours.  Other results: EKG: sinus rhythm at 87 bpm.  Imaging: Ct Head Wo Contrast  Result Date: 03/25/2019 CLINICAL DATA:  Convulsions EXAM: CT HEAD WITHOUT CONTRAST TECHNIQUE: Contiguous axial images were obtained from the base of the skull through  the vertex without intravenous contrast. COMPARISON:  MR brain, 01/12/2019, CT brain, 03/09/2016 FINDINGS: Brain: No evidence of acute infarction, hemorrhage, hydrocephalus, extra-axial collection or mass lesion/mass effect. Periventricular and deep white matter hypodensity. Vascular: No hyperdense vessel or unexpected calcification. Skull: Normal. Negative for fracture or focal lesion. Sinuses/Orbits: No acute finding. Other: None. IMPRESSION: No acute intracranial pathology.  Small-vessel white matter disease. Electronically Signed   By: Lauralyn Primes M.D.   On: 03/25/2019 15:40   Mr Brain Wo Contrast  Result Date: 03/26/2019 CLINICAL DATA:  Encephalopathy.  Possible seizure. EXAM: MRI HEAD WITHOUT CONTRAST TECHNIQUE: Multiplanar, multiecho pulse sequences of the brain and surrounding structures were obtained without intravenous contrast. COMPARISON:  Head CT 03/25/2019 and MRI 01/12/2019 FINDINGS: Brain: No acute infarct, mass, midline shift, or extra-axial fluid collection is identified. Chronic microhemorrhages are again noted in the posterior left temporal lobe, left insula, and possibly right occipital lobe. Patchy T2 hyperintensities throughout the cerebral white matter and pons are unchanged from the prior MRI and nonspecific but compatible with moderate chronic small vessel ischemic disease. Chronic right larger than left cerebellar infarcts are unchanged as are chronic lacunar infarcts in the pons, thalami, and right basal ganglia. There is moderate cerebral atrophy. The hippocampi are symmetric in size without focal signal abnormality identified. Vascular: Major intracranial vascular flow voids are preserved. Skull and upper cervical spine: Unremarkable bone marrow signal. Sinuses/Orbits: Unremarkable orbits. Mild mucosal thickening in the paranasal sinuses, improved from the prior MRI. Clear mastoid air cells. Other: None. IMPRESSION: 1. No acute intracranial abnormality. 2. 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 Electronically Signed   By: Sebastian Ache M.D.   On: 03/26/2019 09:39   US Carotid Bilateral  Result Date: 03/27/2019 CLINICAL DATA:  63 year old male with syncope EXAM: BILATERAL CAROTID DUPLEX ULTRASOUND TECHNIQUE: Wallace Cullens scale imaging, color Doppler and duplex ultrasound  were performed of bilateral carotid and vertebral arteries in the neck. COMPARISON:  None. FINDINGS: Criteria: Quantification of carotid stenosis is based on velocity parameters that correlate the residual internal carotid diameter with NASCET-based stenosis levels, using the diameter of the distal internal carotid lumen as the denominator for stenosis measurement. The following velocity measurements were obtained: RIGHT ICA:  Systolic 98 cm/sec, Diastolic 31 cm/sec CCA:  135 cm/sec SYSTOLIC ICA/CCA RATIO:  0.7 ECA:  99 cm/sec LEFT ICA:  Systolic 80 cm/sec, Diastolic 27 cm/sec CCA:  74 cm/sec SYSTOLIC ICA/CCA RATIO:  1.1 ECA:  98 cm/sec Right Brachial SBP: Not acquired Left Brachial SBP: Not acquired RIGHT CAROTID ARTERY: No significant calcifications of the right common carotid artery. Intermediate waveform maintained. Moderate heterogeneous and partially calcified plaque at the right carotid bifurcation. Lumen shadowing. Low resistance waveform of the right ICA. No significant tortuosity. RIGHT VERTEBRAL ARTERY: Antegrade flow with low resistance waveform. LEFT CAROTID ARTERY: No significant calcifications of the left common carotid artery. Intermediate waveform maintained. Moderate heterogeneous and partially calcified plaque at the left carotid bifurcation. Lumen shadowing low resistance waveform of the left ICA. No significant tortuosity. LEFT VERTEBRAL ARTERY:  Antegrade flow with low resistance waveform. IMPRESSION: Color duplex indicates moderate heterogeneous and calcified plaque, with no hemodynamically  significant stenosis by duplex criteria in the extracranial cerebrovascular circulation. Signed, Yvone Neu. Reyne Dumas, RPVI [facility] Electronically Signed   By: Gilmer Mor D.O.   On: 03/27/2019 07:38     Assessment/Plan: 63 year old male presenting with possible seizure-like activity.  Lab work reveals hyponatremia.  MRI of the brain reviewed and shows multiple chronic infarcts but no acute changes.  Patient on ASA, Plavix and a statin.  Unclear if this was actually a seizure and/or if it may have been provoked.  Further work up recommended.    - Back to baseline - If EEG is no epileptiform discharges then can be d/c today  03/27/2019, 10:59 AM

## 2019-03-27 NOTE — Progress Notes (Signed)
No complaints of pain. No seizure like activity during the night. Pt able to sleep in between care.

## 2019-03-27 NOTE — Progress Notes (Signed)
*  PRELIMINARY RESULTS* Echocardiogram 2D Echocardiogram has been performed.  Larry Riggs 03/27/2019, 9:25 AM

## 2019-03-27 NOTE — Discharge Summary (Signed)
[facility] [place] at [facility]   PATIENT NAME: Larry Riggs    MR#:  [id number]  DATE OF BIRTH:  [birth year]  DATE OF ADMISSION:  03/25/2019   ADMITTING PHYSICIAN: Altamese Dilling, MD  DATE OF DISCHARGE: No discharge date for patient encounter.  PRIMARY CARE PHYSICIAN: Sherrie Mustache, MD   ADMISSION DIAGNOSIS:   Hypokalemia [E87.6] Hyponatremia [E87.1] Seizure-like activity (HCC) [R56.9]  DISCHARGE DIAGNOSIS:   Active Problems:   Seizures (HCC)   SECONDARY DIAGNOSIS:   Past Medical History:  Diagnosis Date  . Asthma   . Coronary atherosclerosis of native coronary vessel    a. 08/2013 Cath: signif 3V dzs w EF of 55%, eval @ [facility] for CABG but felt that there was no good target in the LAD;  b. 12/2013 Cath/PCI: LM nl, LAD 22m (PTCA), 95d (attempted PTCA), subtl distal, D1 95 (2.75x16 Promus DES), D2 90ost, D3 80-90ost, D4 60ost, LCX 90p, small, RCA 28m, PDA nl, RPL1 60, RPL2 90ost.  . Dysphagia   . GERD (gastroesophageal reflux disease)   . Hyperlipidemia   . Hypertension   . Mildly mentally retarded   . Obesity   . Type II diabetes mellitus [facility])     HOSPITAL COURSE:   63 y.o. male with history of CAD, asthma, hyperlipidemia, tension, diabetes mellitus type 2, bipolar disorder, anxiety and depression, mildly mentally retarded, and CVA noted on prior MRI brain presenting with weakness syncope and collapse.  1.  Syncope and collapse -etiology unknown however there were some concerns of possible seizure activity. - Neurology saw patient and further work up done including: - MRI of the brain which showed chronic infarcts but no acute changes - US carotid bilateral shows no hemodynamically significant stenosis. -EEG  normal with no evidence of epileptiform discharges.  Per neurology, if  EEG unremarkable no indication for anticonvulsant therapy - Patient to follow-up with his neurologist Dr. Cristopher Peru.  ? Cardiac - Echo  shows no cardiac  source of emboli or cause for syncope.  EF 60 to 65% - RecentEKG shows NSR rate of 61 -Telemetrymonitoring shows no arrthymias - cardiology follow-up on an outpatient basis for prolonged cardiac monitoring.    ?Vasovagal  -Orthostatic Vital Signs normal  ? Metabolic: No evidence of hypoglycemia, Hypoxia, Shock, Hyperventilation, Anemia, Alcoholic  - U Tox negative - Reviewed medications (hypoglycemic dosing; sulfonylureas and insulin)  2.  Hyponatremia -initial sodium of 125 slightly improved today to 127 - Holding hydrochlorothiazide -TSH normal - Check cortisol, urine osmolality and serum osmolality - Recheck sodium levels in a.m.  3. Hypokalemia - Levels improved with supplement  4. Diabetes mellitus type II - - hemoglobin A1c 6.6 improved from 13.7  3 years ago - Continue home meds  5.  History of CVA -no new strokes - Continuemedical management with dual therapy Aspirin 81 mg/day and Plavix 75 mg /day - Continue Lipitor 80 mg with goal LDL <70 mg/dl - Follows with neurology Dr. Cristopher Peru as above  6.  Drug-induced parkinsonism -mild resting tremors noted in bilateral upper extremities - Follows with neurology as above - Off risperidone  7.  Hypertension -stable - Continue amlodipine, metoprolol and lisinopril - Hold hydrochlorothiazide at discharge due to hyponatremia and hypokalemia.  Will need to follow-up with PCP for further management  DISCHARGE CONDITIONS:   Stable  CONSULTS OBTAINED:   Treatment Team:  Thana Farr, MD  DRUG ALLERGIES:   No Known Allergies DISCHARGE MEDICATIONS:   Allergies as of 03/27/2019  No Known Allergies     Medication List    STOP taking these medications   hydrochlorothiazide 25 MG tablet Commonly known as:  HYDRODIURIL   RisperDAL 4 MG tablet Generic drug:  risperidone     TAKE these medications   Accu-Chek Softclix Lancets lancets   amLODipine 2.5 MG tablet Commonly known as:  NORVASC Take  1 tablet (2.5 mg total) by mouth daily.   aspirin 81 MG EC tablet Take 81 mg by mouth.   atorvastatin 80 MG tablet Commonly known as:  LIPITOR Take 80 mg by mouth daily at 6 PM.   clopidogrel 75 MG tablet Commonly known as:  PLAVIX TAKE 1 TABLET DAILY   fluticasone 220 MCG/ACT inhaler Commonly known as:  Flovent HFA 2 puffs and swallow. Take two (2) times daily after breakfast and bedtime. Rinse mouth after each use.   glipiZIDE 5 MG 24 hr tablet Commonly known as:  GLUCOTROL XL Take 5 mg by mouth 2 (two) times daily.   Januvia 100 MG tablet Generic drug:  sitaGLIPtin Take 100 mg by mouth daily.   lisinopril 40 MG tablet Commonly known as:  ZESTRIL Take 40 mg by mouth daily.   metFORMIN 1000 MG tablet Commonly known as:  GLUCOPHAGE Take 1 tablet (1,000 mg total) by mouth 2 (two) times daily with a meal.   metoprolol tartrate 50 MG tablet Commonly known as:  LOPRESSOR   pantoprazole 40 MG tablet Commonly known as:  PROTONIX Take 40 mg by mouth daily.   potassium chloride 10 MEQ tablet Commonly known as:  K-DUR Take 10 mEq by mouth 2 (two) times a day.   ProAir HFA 108 (90 Base) MCG/ACT inhaler Generic drug:  albuterol Inhale into the lungs.   tamsulosin 0.4 MG Caps capsule Commonly known as:  FLOMAX Take 0.4 mg by mouth daily after supper.   traZODone 50 MG tablet Commonly known as:  DESYREL Take 50 mg by mouth at bedtime.        DISCHARGE INSTRUCTIONS:    DIET:   Diabetic diet  ACTIVITY:   Activity as tolerated  OXYGEN:   Home Oxygen: No.  Oxygen Delivery: room air  DISCHARGE LOCATION:   group home   If you experience worsening of your admission symptoms, develop shortness of breath, life threatening emergency, suicidal or homicidal thoughts you must seek medical attention immediately by calling 911 or calling your MD immediately  if symptoms less severe.  You Must read complete instructions/literature along with all the possible  adverse reactions/side effects for all the Medicines you take and that have been prescribed to you. Take any new Medicines after you have completely understood and accpet all the possible adverse reactions/side effects.   Please note  You were cared for by a hospitalist during your hospital stay. If you have any questions about your discharge medications or the care you received while you were in the hospital after you are discharged, you can call the unit and asked to speak with the hospitalist on call if the hospitalist that took care of you is not available. Once you are discharged, your primary care physician will handle any further medical issues. Please note that NO REFILLS for any discharge medications will be authorized once you are discharged, as it is imperative that you return to your primary care physician (or establish a relationship with a primary care physician if you do not have one) for your aftercare needs so that they can reassess your need for medications and  monitor your lab values.    On the day of Discharge:  VITAL SIGNS:   Blood pressure (!) 176/100, pulse 91, temperature 97.8 F (36.6 C), temperature source Oral, resp. rate 18, height 5\' 8"  (1.727 m), weight 90.7 kg, SpO2 96 %.  PHYSICAL EXAMINATION:    GENERAL:  63 y.o.-year-old patient lying in the bed with no acute distress.  EYES: Pupils equal, round, reactive to light and accommodation. No scleral icterus. Extraocular muscles intact.  HEENT: Head atraumatic, normocephalic. Oropharynx and nasopharynx clear.  NECK:  Supple, no jugular venous distention. No thyroid enlargement, no tenderness.  LUNGS: Normal breath sounds bilaterally, no wheezing, rales,rhonchi or crepitation. No use of accessory muscles of respiration.  CARDIOVASCULAR: S1, S2 normal. No murmurs, rubs, or gallops.  ABDOMEN: Soft, non-tender, non-distended. Bowel sounds present. No organomegaly or mass.  EXTREMITIES: No pedal edema, cyanosis, or  clubbing.  NEUROLOGIC: Cranial nerves II through XII are intact. Muscle strength 5/5 in all extremities. Sensation intact. Gait not checked.  PSYCHIATRIC: The patient is alert and oriented x 3.  SKIN: No obvious rash, lesion, or ulcer.   DATA REVIEW:   CBC Recent Labs  Lab 03/26/19 0732  WBC 8.4  HGB 14.1  HCT 40.9  PLT 216    Chemistries  Recent Labs  Lab 03/27/19 0426  NA 133*  K 3.2*  CL 100  CO2 27  GLUCOSE 163*  BUN 9  CREATININE 0.84  CALCIUM 8.5*  MG 2.0     Microbiology Results  Results for orders placed or performed during the hospital encounter of 03/25/19  SARS Coronavirus 2 [facility](Hospital order, Performed in [facility] lab)     Status: None   Collection Time: 03/25/19  3:15 PM  Result Value Ref Range Status   SARS Coronavirus 2 NEGATIVE NEGATIVE Final    Comment: (NOTE) If result is NEGATIVE SARS-CoV-2 target nucleic acids are NOT DETECTED. The SARS-CoV-2 RNA is generally detectable in upper and lower  respiratory specimens during the acute phase of infection. The lowest  concentration of SARS-CoV-2 viral copies this assay can detect is 250  copies / mL. A negative result does not preclude SARS-CoV-2 infection  and should not be used as the sole basis for treatment or other  patient management decisions.  A negative result may occur with  improper specimen collection / handling, submission of specimen other  than nasopharyngeal swab, presence of viral mutation(s) within the  areas targeted by this assay, and inadequate number of viral copies  (<250 copies / mL). A negative result must be combined with clinical  observations, patient history, and epidemiological information. If result is POSITIVE SARS-CoV-2 target nucleic acids are DETECTED. The SARS-CoV-2 RNA is generally detectable in upper and lower  respiratory specimens dur ing the acute phase of infection.  Positive  results are indicative of active infection with SARS-CoV-2.  Clinical   correlation with patient history and other diagnostic information is  necessary to determine patient infection status.  Positive results do  not rule out bacterial infection or co-infection with other viruses. If result is PRESUMPTIVE POSTIVE SARS-CoV-2 nucleic acids MAY BE PRESENT.   A presumptive positive result was obtained on the submitted specimen  and confirmed on repeat testing.  While 2019 novel coronavirus  (SARS-CoV-2) nucleic acids may be present in the submitted sample  additional confirmatory testing may be necessary for epidemiological  and / or clinical management purposes  to differentiate between  SARS-CoV-2 and other Sarbecovirus currently known to infect humans.  If clinically indicated additional testing with an alternate test  methodology [phone]) is advised. The SARS-CoV-2 RNA is generally  detectable in upper and lower respiratory sp ecimens during the acute  phase of infection. The expected result is Negative. Fact Sheet for Patients:  BoilerBrush.com.cy Fact Sheet for Healthcare Providers: https://pope.com/ This test is not yet approved or cleared by the [place] FDA and has been authorized for detection and/or diagnosis of SARS-CoV-2 by FDA under an Emergency Use Authorization (EUA).  This EUA will remain in effect (meaning this test can be used) for the duration of the COVID-19 declaration under Section 564(b)(1) of the Act, 21 U.S.C. section 360bbb-3(b)(1), unless the authorization is terminated or revoked sooner. Performed at [facility], [address] [place], [place] [address]     RADIOLOGY:  Mr Sherrin Daisy Contrast  Result Date: 03/26/2019 CLINICAL DATA:  Encephalopathy.  Possible seizure. EXAM: MRI HEAD WITHOUT CONTRAST TECHNIQUE: Multiplanar, multiecho pulse sequences of the brain and surrounding structures were obtained without intravenous contrast. COMPARISON:  Head CT 03/25/2019 and  MRI 01/12/2019 FINDINGS: Brain: No acute infarct, mass, midline shift, or extra-axial fluid collection is identified. Chronic microhemorrhages are again noted in the posterior left temporal lobe, left insula, and possibly right occipital lobe. Patchy T2 hyperintensities throughout the cerebral white matter and pons are unchanged from the prior MRI and nonspecific but compatible with moderate chronic small vessel ischemic disease. Chronic right larger than left cerebellar infarcts are unchanged as are chronic lacunar infarcts in the pons, thalami, and right basal ganglia. There is moderate cerebral atrophy. The hippocampi are symmetric in size without focal signal abnormality identified. Vascular: Major intracranial vascular flow voids are preserved. Skull and upper cervical spine: Unremarkable bone marrow signal. Sinuses/Orbits: Unremarkable orbits. Mild mucosal thickening in the paranasal sinuses, improved from the prior MRI. Clear mastoid air cells. Other: None. IMPRESSION: 1. No acute intracranial abnormality. 2. 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 Electronically Signed   By: Sebastian Ache M.D.   On: 03/26/2019 09:39   US Carotid Bilateral  Result Date: 03/27/2019 CLINICAL DATA:  63 year old male with syncope EXAM: BILATERAL CAROTID DUPLEX ULTRASOUND TECHNIQUE: Wallace Cullens scale imaging, color Doppler and duplex ultrasound were performed of bilateral carotid and vertebral arteries in the neck. COMPARISON:  None. FINDINGS: Criteria: Quantification of carotid stenosis is based on velocity parameters that correlate the residual internal carotid diameter with NASCET-based stenosis levels, using the diameter of the distal internal carotid lumen as the denominator for stenosis measurement. The following velocity measurements were obtained: RIGHT ICA:  Systolic 98 cm/sec, Diastolic 31 cm/sec CCA:  135 cm/sec SYSTOLIC  ICA/CCA RATIO:  0.7 ECA:  99 cm/sec LEFT ICA:  Systolic 80 cm/sec, Diastolic 27 cm/sec CCA:  74 cm/sec SYSTOLIC ICA/CCA RATIO:  1.1 ECA:  98 cm/sec Right Brachial SBP: Not acquired Left Brachial SBP: Not acquired RIGHT CAROTID ARTERY: No significant calcifications of the right common carotid artery. Intermediate waveform maintained. Moderate heterogeneous and partially calcified plaque at the right carotid bifurcation. Lumen shadowing. Low resistance waveform of the right ICA. No significant tortuosity. RIGHT VERTEBRAL ARTERY: Antegrade flow with low resistance waveform. LEFT CAROTID ARTERY: No significant calcifications of the left common carotid artery. Intermediate waveform maintained. Moderate heterogeneous and partially calcified plaque at the left carotid bifurcation. Lumen shadowing low resistance waveform of the left ICA. No significant tortuosity. LEFT VERTEBRAL ARTERY:  Antegrade flow with low resistance waveform. IMPRESSION: Color duplex indicates  moderate heterogeneous and calcified plaque, with no hemodynamically significant stenosis by duplex criteria in the extracranial cerebrovascular circulation. Signed, Yvone Neu. Reyne Dumas, RPVI [facility] Radiology Electronically Signed   By: Gilmer Mor D.O.   On: 03/27/2019 07:38     Management plans discussed with the patient, family and they are in agreement.  CODE STATUS:     Code Status Orders  (From admission, onward)         Start     Ordered   03/26/19 0713  Full code  Continuous     03/26/19 0713        Code Status History    Date Active Date Inactive Code Status Order ID Comments User Context   03/25/2019 1940 03/26/2019 0713 Partial Code [id number]  Altamese Dilling, MD ED   03/10/2016 0151 03/13/2016 2106 Full Code [id number]  Schorr, Roma Kayser, NP Inpatient   03/09/2016 1758 03/10/2016 0151 Full Code [id number]  Joseph Art, DO ED   12/27/2013 1616 12/28/2013 1529 Full Code  [id number]  Iran Ouch, MD Inpatient      TOTAL TIME TAKING CARE OF THIS PATIENT: 39 minutes.    Loraine Leriche M.D on 03/27/2019 at 8:18 AM  Between 7am to 6pm - Pager - [phone]  After 6pm go to [email or web address] - Social research officer, government  [facility]  Office  [phone]  CC: Primary care physician; Sherrie Mustache, MD   Note: This dictation was prepared with Dragon dictation along with smaller phrase technology. Any transcriptional errors that result from this process are unintentional.

## 2019-03-27 NOTE — Progress Notes (Signed)
Discharge AVS reviewed with verbal understanding with Robin. Escorted to personal vehicle.

## 2019-03-27 NOTE — Progress Notes (Signed)
eeg complete.

## 2019-03-27 NOTE — Procedures (Signed)
History: 63 yo M with possible seizure  Sedation: None  Technique: This is a 21 channel routine scalp EEG performed at the bedside with bipolar and monopolar montages arranged in accordance to the international 10/20 system of electrode placement. One channel was dedicated to EKG recording.    Background: The background consists of intermixed alpha and beta activities. There is a well defined posterior dominant rhythm of 9-10 Hz that attenuates with eye opening. Sleep is recorded with normal appearing structures.   Photic stimulation: Physiologic driving is not performed  EEG Abnormalities: None  Clinical Interpretation: This normal EEG is recorded in the waking and sleep state. There was no seizure or seizure predisposition recorded on this study. Please note that lack of epileptiform activity on EEG does not preclude the possibility of epilepsy.   Ritta Slot, MD [facility] [phone]  If 7pm- 7am, please page neurology on call as listed in AMION.

## 2019-03-27 NOTE — NC FL2 (Signed)
[place] MEDICAID FL2 LEVEL OF CARE SCREENING TOOL     IDENTIFICATION  Patient Name: Larry Riggs Birthdate: Aug 05, 1956 Sex: male Admission Date (Current Location): 03/25/2019  [place]   Facility and Address:  [facility], [address], [place], [place] [address]      Provider Number: [id number]  Attending Physician Name and Address:  Mayo, Allyn KennerKaty Dodd, MD  Relative Name and Phone Number:       Current Level of Care: Hospital Recommended Level of Care: Assisted Living Facility Prior Approval Number:    Date Approved/Denied:   PASRR Number:    Discharge Plan: (alf)    Current Diagnoses: Patient Active Problem List   Diagnosis Date Noted  . Elevated troponin 03/12/2016  . CVA (cerebral infarction)   . Syncope and collapse 03/09/2016  . Seizures (HCC) 03/09/2016  . Hypokalemia 03/09/2016  . Hyperglycemia 03/09/2016  . Syncope 03/09/2016  . Angina, class III (HCC) 12/28/2013  . Presence of drug coated stent (Promus Premier DES 2.75 x 16) in D1 branch of LAD coronary artery; PTCA of LAD 12/28/2013  . Type II diabetes mellitus (HCC)   . Asthma   . Angina effort 12/27/2013  . Coronary atherosclerosis of native coronary vessel   . Hyperlipidemia   . Hypertension     Orientation RESPIRATION BLADDER Height & Weight     Self, Place  Normal Continent Weight: 200 lb (90.7 kg) Height:  5\' 8"  (172.7 cm)  BEHAVIORAL SYMPTOMS/MOOD NEUROLOGICAL BOWEL NUTRITION STATUS  (none) Convulsions/Seizures Continent Diet(heart healthy/carb modified)  AMBULATORY STATUS COMMUNICATION OF NEEDS Skin   Limited Assist Verbally Normal                       Personal Care Assistance Level of Assistance  Bathing, Dressing, Feeding Bathing Assistance: Limited assistance Feeding assistance: Limited assistance Dressing Assistance: Limited assistance     Functional Limitations Info             SPECIAL CARE FACTORS FREQUENCY                        Contractures Contractures Info: Not present    Additional Factors Info  Code Status Code Status Info: full             Medication List    STOP taking these medications   hydrochlorothiazide 25 MG tablet Commonly known as:  HYDRODIURIL   RisperDAL 4 MG tablet Generic drug:  risperidone     TAKE these medications   Accu-Chek Softclix Lancets lancets   amLODipine 2.5 MG tablet Commonly known as:  NORVASC Take 1 tablet (2.5 mg total) by mouth daily.   aspirin 81 MG EC tablet Take 81 mg by mouth.   atorvastatin 80 MG tablet Commonly known as:  LIPITOR Take 80 mg by mouth daily at 6 PM.   clopidogrel 75 MG tablet Commonly known as:  PLAVIX TAKE 1 TABLET DAILY   fluticasone 220 MCG/ACT inhaler Commonly known as:  Flovent HFA 2 puffs and swallow. Take two (2) times daily after breakfast and bedtime. Rinse mouth after each use.   glipiZIDE 5 MG 24 hr tablet Commonly known as:  GLUCOTROL XL Take 5 mg by mouth 2 (two) times daily.   Januvia 100 MG tablet Generic drug:  sitaGLIPtin Take 100 mg by mouth daily.   lisinopril 40 MG tablet Commonly known as:  ZESTRIL Take 40 mg by mouth daily.  metFORMIN 1000 MG tablet Commonly known as:  GLUCOPHAGE Take 1 tablet (1,000 mg total) by mouth 2 (two) times daily with a meal.   metoprolol tartrate 50 MG tablet Commonly known as:  LOPRESSOR   pantoprazole 40 MG tablet Commonly known as:  PROTONIX Take 40 mg by mouth daily.   potassium chloride 10 MEQ tablet Commonly known as:  K-DUR Take 10 mEq by mouth 2 (two) times a day.   ProAir HFA 108 (90 Base) MCG/ACT inhaler Generic drug:  albuterol Inhale into the lungs.   tamsulosin 0.4 MG Caps capsule Commonly known as:  FLOMAX Take 0.4 mg by mouth daily after supper.   traZODone 50 MG tablet Commonly known as:  DESYREL Take 50 mg by mouth at bedtime.        Additional Information    York Spaniel,  LCSW

## 2019-03-27 NOTE — TOC Transition Note (Signed)
Transition of Care [facility]) - CM/SW Discharge Note   Patient Details  Name: Larry Riggs MRN: [id number] Date of Birth: [birth year]  [facility] CM/SW Contact:  Ruthe Mannan, LCSWA Phone Number: 03/27/2019, 3:25 PM   Clinical Narrative:  Patient is a resident at [facility] in [place]. CSW contacted patient's group home director Mellody Life. Corrie Dandy states that she is in contact with patient's brother, but patient is his own responsible party and has no guardian. Corrie Dandy reports that patient can return when medically ready. CSW explained that patient is ready for discharge today. Corrie Dandy states that they will transport patient. CSW completed DC packet for group home to pick up     Final next level of care: Other (comment)(Group Home ) Barriers to Discharge: No Barriers Identified   Patient Goals and CMS Choice Patient states their goals for this hospitalization and ongoing recovery are:: return to group home  CMS Medicare.gov Compare Post Acute Care list provided to:: Patient    Discharge Placement                    Patient and family notified of of transfer: 03/27/19  Discharge Plan and Services                                     Social Determinants of Health (SDOH) Interventions     Readmission Risk Interventions No flowsheet data found.

## 2019-03-28 LAB — HIV ANTIBODY (ROUTINE TESTING W REFLEX): HIV Screen 4th Generation wRfx: NONREACTIVE

## 2019-04-03 ENCOUNTER — Ambulatory Visit: Payer: Federal, State, Local not specified - PPO | Admitting: Podiatry

## 2019-04-06 ENCOUNTER — Other Ambulatory Visit: Payer: Self-pay

## 2019-04-06 ENCOUNTER — Encounter: Payer: Self-pay | Admitting: *Deleted

## 2019-04-06 NOTE — Discharge Instructions (Signed)

## 2019-04-07 ENCOUNTER — Other Ambulatory Visit
Admission: RE | Admit: 2019-04-07 | Discharge: 2019-04-07 | Disposition: A | Discharge status: Home / Self Care | Payer: Medicare Other | Source: Ambulatory Visit | Attending: Ophthalmology | Admitting: Ophthalmology

## 2019-04-07 DIAGNOSIS — Z1159 Encounter for screening for other viral diseases: Secondary | ICD-10-CM | POA: Insufficient documentation

## 2019-04-08 LAB — NOVEL CORONAVIRUS, NAA (HOSP ORDER, SEND-OUT TO REF LAB; TAT 18-24 HRS): SARS-CoV-2, NAA: NOT DETECTED

## 2019-04-11 ENCOUNTER — Ambulatory Visit: Payer: Medicare Other | Admitting: Anesthesiology

## 2019-04-11 ENCOUNTER — Ambulatory Visit
Admission: RE | Admit: 2019-04-11 | Discharge: 2019-04-11 | Disposition: A | Discharge status: Home / Self Care | Payer: Medicare Other | Attending: Ophthalmology | Admitting: Ophthalmology

## 2019-04-11 ENCOUNTER — Other Ambulatory Visit: Payer: Self-pay

## 2019-04-11 ENCOUNTER — Encounter
Admission: RE | Disposition: A | Discharge status: Home / Self Care | Payer: Self-pay | Source: Home / Self Care | Attending: Ophthalmology

## 2019-04-11 DIAGNOSIS — I252 Old myocardial infarction: Secondary | ICD-10-CM | POA: Diagnosis not present

## 2019-04-11 DIAGNOSIS — I10 Essential (primary) hypertension: Secondary | ICD-10-CM | POA: Insufficient documentation

## 2019-04-11 DIAGNOSIS — Z7984 Long term (current) use of oral hypoglycemic drugs: Secondary | ICD-10-CM | POA: Diagnosis not present

## 2019-04-11 DIAGNOSIS — Z79899 Other long term (current) drug therapy: Secondary | ICD-10-CM | POA: Insufficient documentation

## 2019-04-11 DIAGNOSIS — I2510 Atherosclerotic heart disease of native coronary artery without angina pectoris: Secondary | ICD-10-CM | POA: Insufficient documentation

## 2019-04-11 DIAGNOSIS — E7800 Pure hypercholesterolemia, unspecified: Secondary | ICD-10-CM | POA: Diagnosis not present

## 2019-04-11 DIAGNOSIS — H2512 Age-related nuclear cataract, left eye: Secondary | ICD-10-CM | POA: Diagnosis not present

## 2019-04-11 DIAGNOSIS — F329 Major depressive disorder, single episode, unspecified: Secondary | ICD-10-CM | POA: Insufficient documentation

## 2019-04-11 DIAGNOSIS — Z955 Presence of coronary angioplasty implant and graft: Secondary | ICD-10-CM | POA: Diagnosis not present

## 2019-04-11 DIAGNOSIS — Z7982 Long term (current) use of aspirin: Secondary | ICD-10-CM | POA: Insufficient documentation

## 2019-04-11 DIAGNOSIS — E1136 Type 2 diabetes mellitus with diabetic cataract: Secondary | ICD-10-CM | POA: Insufficient documentation

## 2019-04-11 HISTORY — PX: CATARACT EXTRACTION W/PHACO: SHX586

## 2019-04-11 HISTORY — DX: Acute myocardial infarction, unspecified: I21.9

## 2019-04-11 LAB — GLUCOSE, CAPILLARY
Glucose-Capillary: 100 mg/dL — ABNORMAL HIGH (ref 70–99)
Glucose-Capillary: 71 mg/dL (ref 70–99)
Glucose-Capillary: 80 mg/dL (ref 70–99)

## 2019-04-11 SURGERY — PHACOEMULSIFICATION, CATARACT, WITH IOL INSERTION
Anesthesia: Monitor Anesthesia Care | Site: Eye | Laterality: Left

## 2019-04-11 MED ORDER — TETRACAINE HCL 0.5 % OP SOLN
1.0000 [drp] | OPHTHALMIC | Status: DC | PRN
  Administered 2019-04-11 (×3): 1 [drp] via OPHTHALMIC

## 2019-04-11 MED ORDER — EPINEPHRINE PF 1 MG/ML IJ SOLN
INTRAOCULAR | Status: DC | PRN
  Administered 2019-04-11: 82 mL via OPHTHALMIC

## 2019-04-11 MED ORDER — DEXTROSE 50 % IV SOLN
12.5000 g | Freq: Once | INTRAVENOUS | Status: AC
  Administered 2019-04-11: 14:00:00 12.5 g via INTRAVENOUS

## 2019-04-11 MED ORDER — LIDOCAINE HCL (PF) 2 % IJ SOLN
INTRAOCULAR | Status: DC | PRN
  Administered 2019-04-11: 1 mL

## 2019-04-11 MED ORDER — MIDAZOLAM HCL 2 MG/2ML IJ SOLN
INTRAMUSCULAR | Status: DC | PRN
  Administered 2019-04-11: 2 mg via INTRAVENOUS

## 2019-04-11 MED ORDER — FENTANYL CITRATE (PF) 100 MCG/2ML IJ SOLN
INTRAMUSCULAR | Status: DC | PRN
  Administered 2019-04-11: 50 ug via INTRAVENOUS

## 2019-04-11 MED ORDER — LACTATED RINGERS IV SOLN: 10.0000 mL/h | INTRAVENOUS | Status: DC

## 2019-04-11 MED ORDER — NA CHONDROIT SULF-NA HYALURON 40-17 MG/ML IO SOLN
INTRAOCULAR | Status: DC | PRN
  Administered 2019-04-11: 1 mL via INTRAOCULAR

## 2019-04-11 MED ORDER — BRIMONIDINE TARTRATE-TIMOLOL 0.2-0.5 % OP SOLN
OPHTHALMIC | Status: DC | PRN
  Administered 2019-04-11: 1 [drp] via OPHTHALMIC

## 2019-04-11 MED ORDER — MOXIFLOXACIN HCL 0.5 % OP SOLN
OPHTHALMIC | Status: DC | PRN
  Administered 2019-04-11: 0.2 mL via OPHTHALMIC

## 2019-04-11 MED ORDER — ARMC OPHTHALMIC DILATING DROPS
1.0000 "application " | OPHTHALMIC | Status: DC | PRN
  Administered 2019-04-11 (×3): 1 via OPHTHALMIC

## 2019-04-11 SURGICAL SUPPLY — 21 items
CANNULA ANT/CHMB 27G (MISCELLANEOUS) ×1 IMPLANT
CANNULA ANT/CHMB 27GA (MISCELLANEOUS) ×3 IMPLANT
GLOVE SURG LX 8.0 MICRO (GLOVE) ×2
GLOVE SURG LX STRL 8.0 MICRO (GLOVE) ×1 IMPLANT
GLOVE SURG TRIUMPH 8.0 PF LTX (GLOVE) ×3 IMPLANT
GOWN STRL REUS W/ TWL LRG LVL3 (GOWN DISPOSABLE) ×2 IMPLANT
GOWN STRL REUS W/TWL LRG LVL3 (GOWN DISPOSABLE) ×4
LENS IOL TECNIS ITEC 18.5 (Intraocular Lens) ×2 IMPLANT
MARKER SKIN DUAL TIP RULER LAB (MISCELLANEOUS) ×3 IMPLANT
NDL FILTER BLUNT 18X1 1/2 (NEEDLE) ×1 IMPLANT
NDL RETROBULBAR .5 NSTRL (NEEDLE) ×3 IMPLANT
NEEDLE FILTER BLUNT 18X 1/2SAF (NEEDLE) ×2
NEEDLE FILTER BLUNT 18X1 1/2 (NEEDLE) ×1 IMPLANT
PACK CATARACT BRASINGTON (MISCELLANEOUS) ×3 IMPLANT
PACK EYE AFTER SURG (MISCELLANEOUS) ×3 IMPLANT
PACK OPTHALMIC (MISCELLANEOUS) ×3 IMPLANT
SUT ETHILON 10-0 CS-B-6CS-B-6 (SUTURE)
SUTURE EHLN 10-0 CS-B-6CS-B-6 (SUTURE) IMPLANT
SYR 3ML LL SCALE MARK (SYRINGE) ×3 IMPLANT
SYR TB 1ML LUER SLIP (SYRINGE) ×3 IMPLANT
WIPE NON LINTING 3.25X3.25 (MISCELLANEOUS) ×3 IMPLANT

## 2019-04-11 NOTE — Op Note (Signed)
PREOPERATIVE DIAGNOSIS:  Nuclear sclerotic cataract of the left eye.   POSTOPERATIVE DIAGNOSIS:  Nuclear sclerotic cataract of the left eye.   OPERATIVE PROCEDURE: Procedure(s): CATARACT EXTRACTION PHACO AND INTRAOCULAR LENS PLACEMENT (IOC)  LEFT DIABETIC   SURGEON:  Galen Manila, MD.   ANESTHESIA:  Anesthesiologist: Jolayne Panther, MD CRNA: Maryan Rued, CRNA  1.      Managed anesthesia care. 2.     0.58ml of Shugarcaine was instilled following the paracentesis   COMPLICATIONS:  None.   TECHNIQUE:   Stop and chop   DESCRIPTION OF PROCEDURE:  The patient was examined and consented in the preoperative holding area where the aforementioned topical anesthesia was applied to the left eye and then brought back to the Operating Room where the left eye was prepped and draped in the usual sterile ophthalmic fashion and a lid speculum was placed. A paracentesis was created with the side port blade and the anterior chamber was filled with viscoelastic. A near clear corneal incision was performed with the steel keratome. A continuous curvilinear capsulorrhexis was performed with a cystotome followed by the capsulorrhexis forceps. Hydrodissection and hydrodelineation were carried out with BSS on a blunt cannula. The lens was removed in a stop and chop  technique and the remaining cortical material was removed with the irrigation-aspiration handpiece. The capsular bag was inflated with viscoelastic and the Technis [id number] lens was placed in the capsular bag without complication. The remaining viscoelastic was removed from the eye with the irrigation-aspiration handpiece. The wounds were hydrated. The anterior chamber was flushed with BSS and the eye was inflated to physiologic pressure. 0.31ml Vigamox was placed in the anterior chamber. The wounds were found to be water tight. The eye was dressed with Combigan. The patient was given protective glasses to wear throughout the day and a shield with which  to sleep tonight. The patient was also given drops with which to begin a drop regimen today and will follow-up with me in one day. Implant Name Type Inv. Item Serial No. Manufacturer Lot No. LRB No. Used  LENS IOL DIOP 18.5 - [id number] Intraocular Lens LENS IOL DIOP 18.5 [id number] AMO  Left 1    Procedure(s) with comments: CATARACT EXTRACTION PHACO AND INTRAOCULAR LENS PLACEMENT (IOC)  LEFT DIABETIC (Left) - Diabetic - oral meds  Electronically signed: Galen Manila 04/11/2019 3:09 PM

## 2019-04-11 NOTE — Anesthesia Procedure Notes (Signed)
Procedure Name: MAC Date/Time: 04/11/2019 2:47 PM Performed by: Janna Arch, CRNA Pre-anesthesia Checklist: Patient identified, Emergency Drugs available, Suction available, Timeout performed and Patient being monitored Patient Re-evaluated:Patient Re-evaluated prior to induction Oxygen Delivery Method: Nasal cannula Placement Confirmation: positive ETCO2

## 2019-04-11 NOTE — Anesthesia Preprocedure Evaluation (Addendum)
Anesthesia Evaluation  Patient identified by MRN, date of birth, ID band Patient awake    Reviewed: Allergy & Precautions, H&P , NPO status , Patient's Chart, lab work & pertinent test results  Airway Mallampati: II  TM Distance: >3 FB Neck ROM: full    Dental   Pulmonary asthma ,    Pulmonary exam normal        Cardiovascular hypertension, On Medications + angina + Past MI  Normal cardiovascular exam Rhythm:regular Rate:Normal     Neuro/Psych Seizures -,     GI/Hepatic Neg liver ROS, Medicated,  Endo/Other  diabetes  Renal/GU      Musculoskeletal   Abdominal   Peds  Hematology negative hematology ROS (+)   Anesthesia Other Findings   Reproductive/Obstetrics                            Anesthesia Physical Anesthesia Plan  ASA: III  Anesthesia Plan: MAC   Post-op Pain Management:    Induction:   PONV Risk Score and Plan:   Airway Management Planned:   Additional Equipment:   Intra-op Plan:   Post-operative Plan:   Informed Consent: I have reviewed the patients History and Physical, chart, labs and discussed the procedure including the risks, benefits and alternatives for the proposed anesthesia with the patient or authorized representative who has indicated his/her understanding and acceptance.       Plan Discussed with:   Anesthesia Plan Comments:         Anesthesia Quick Evaluation

## 2019-04-11 NOTE — H&P (Signed)
All labs reviewed. Abnormal studies sent to patients PCP when indicated.  Previous H&P reviewed, patient examined, there are NO CHANGES.  Larry Radabaugh Porfilio5/26/20202:39 PM

## 2019-04-11 NOTE — Anesthesia Postprocedure Evaluation (Signed)
Anesthesia Post Note  Patient: Larry Riggs  Procedure(s) Performed: CATARACT EXTRACTION PHACO AND INTRAOCULAR LENS PLACEMENT (IOC)  LEFT DIABETIC (Left Eye)  Patient location during evaluation: PACU Anesthesia Type: MAC Level of consciousness: awake and alert Pain management: pain level controlled Vital Signs Assessment: post-procedure vital signs reviewed and stable Respiratory status: spontaneous breathing Cardiovascular status: stable Anesthetic complications: no    Leticia Coletta, III,  Vida Nicol D

## 2019-04-11 NOTE — Transfer of Care (Signed)
Immediate Anesthesia Transfer of Care Note  Patient: Larry Riggs  Procedure(s) Performed: CATARACT EXTRACTION PHACO AND INTRAOCULAR LENS PLACEMENT (IOC)  LEFT DIABETIC (Left Eye)  Patient Location: PACU  Anesthesia Type: MAC  Level of Consciousness: awake, alert  and patient cooperative  Airway and Oxygen Therapy: Patient Spontanous Breathing and Patient connected to supplemental oxygen  Post-op Assessment: Post-op Vital signs reviewed, Patient's Cardiovascular Status Stable, Respiratory Function Stable, Patent Airway and No signs of Nausea or vomiting  Post-op Vital Signs: Reviewed and stable  Complications: No apparent anesthesia complications

## 2019-04-12 ENCOUNTER — Encounter: Payer: Self-pay | Admitting: Ophthalmology

## 2019-04-17 ENCOUNTER — Ambulatory Visit: Payer: Federal, State, Local not specified - PPO | Admitting: Podiatry

## 2019-05-02 ENCOUNTER — Encounter: Payer: Self-pay | Admitting: *Deleted

## 2019-05-02 ENCOUNTER — Other Ambulatory Visit: Payer: Self-pay

## 2019-05-03 ENCOUNTER — Encounter: Payer: Self-pay | Admitting: Anesthesiology

## 2019-05-04 NOTE — Discharge Instructions (Signed)

## 2019-05-05 ENCOUNTER — Other Ambulatory Visit: Admission: RE | Admit: 2019-05-05 | Payer: Medicare Other | Source: Ambulatory Visit

## 2019-05-09 ENCOUNTER — Ambulatory Visit: Admission: RE | Admit: 2019-05-09 | Payer: Medicare Other | Source: Home / Self Care | Admitting: Ophthalmology

## 2019-05-09 ENCOUNTER — Other Ambulatory Visit: Payer: Medicare Other

## 2019-05-09 SURGERY — PHACOEMULSIFICATION, CATARACT, WITH IOL INSERTION
Anesthesia: Topical | Laterality: Right

## 2019-05-17 DEATH — deceased

## 2020-10-31 IMAGING — MR MRI HEAD WITHOUT CONTRAST
9 of 14 series · 26 of 48 positions shown · non-contrast
Comparison: Head CT 03/25/2019 and MRI 01/12/2019

CLINICAL DATA: Encephalopathy.  Possible seizure.

EXAM:
MRI HEAD WITHOUT CONTRAST
TECHNIQUE: Multiplanar, multiecho pulse sequences of the brain and surrounding
structures were obtained without intravenous contrast.

[Series 5: T1 · sagittal · 5.0mm · 0.62mm/px · 3 of 25 slices shown (1 of 3)]
[im 1/25]
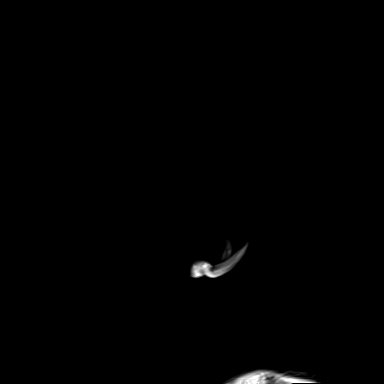
[im 13/25]
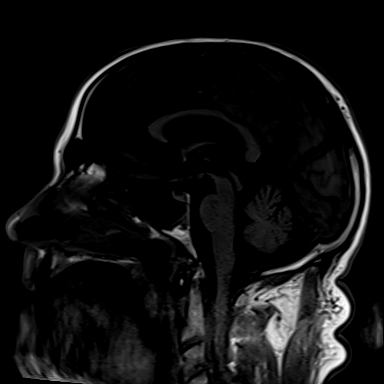
[im 25/25]
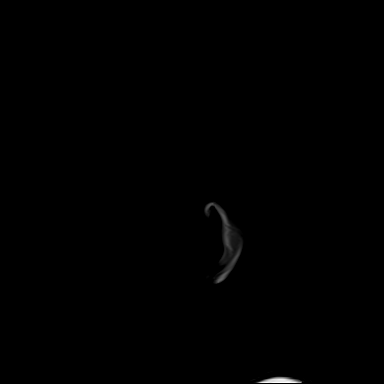

[Series 6: ax dwi_tracew · axial · 3.0mm · 0.73mm/px · z∈[-56,+106]mm · 6 of 55 slices shown]
[im 1/55]
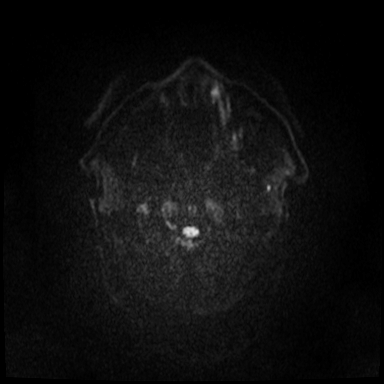
[im 11/55]
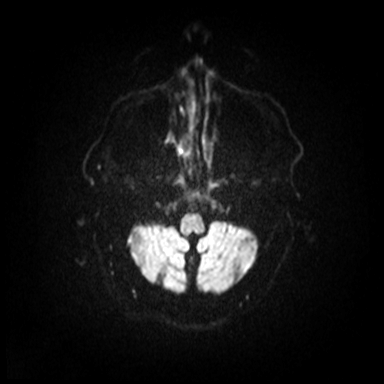
[im 22/55]
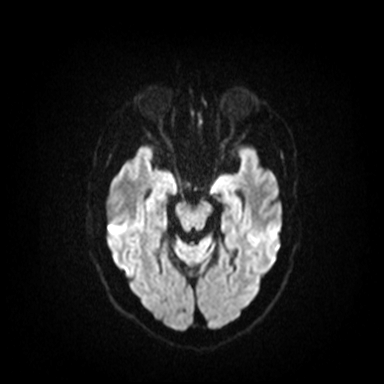
[im 33/55]
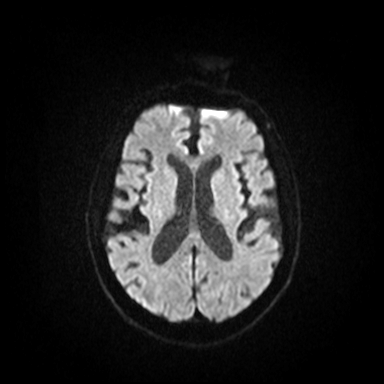
[im 44/55]
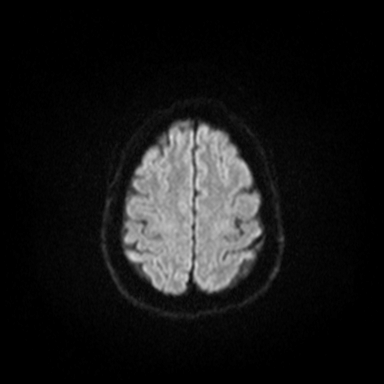
[im 55/55]
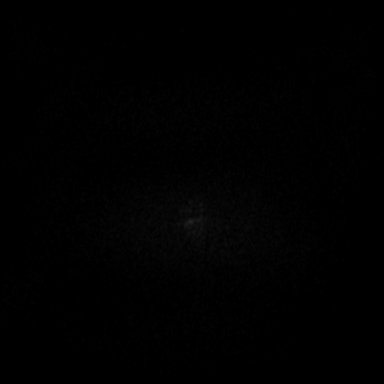

[Series 7: ax dwi_adc · axial · 3.0mm · 0.73mm/px · z∈[-56,+25]mm · 3 of 55 slices shown]
[im 1/55]
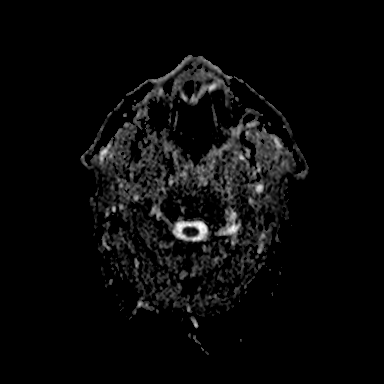
[im 14/55]
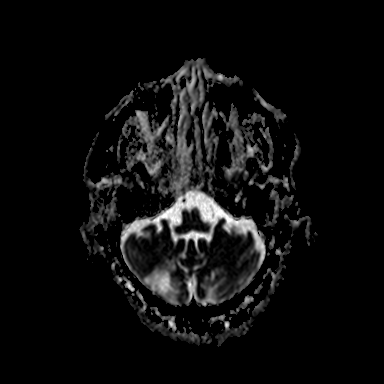
[im 28/55]
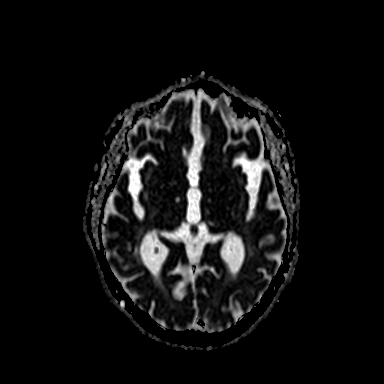

[Series 10: T2 · axial · 5.0mm · 0.55mm/px · z∈[-52,+103]mm · 2 of 27 slices shown (1 of 2)]
[im 1/27]
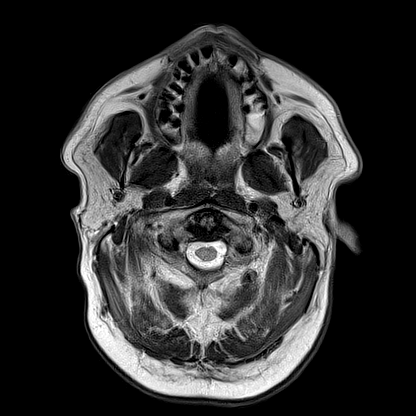
[im 27/27]
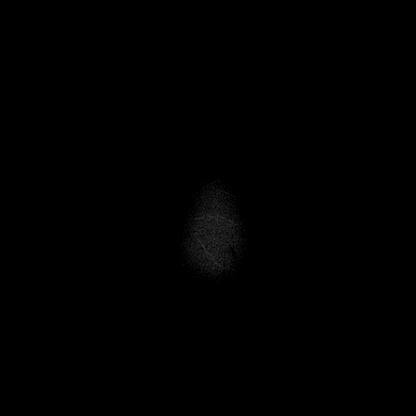

[Series 15: T2 · coronal · 3.0mm · 0.23mm/px · 3 of 35 slices shown (2 of 2)]
[im 1/35]
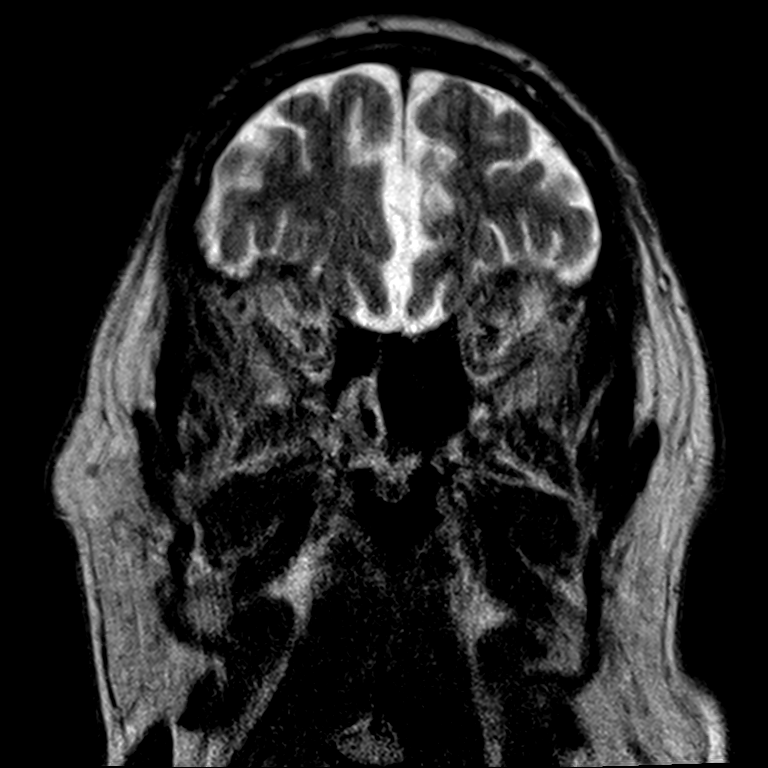
[im 18/35]
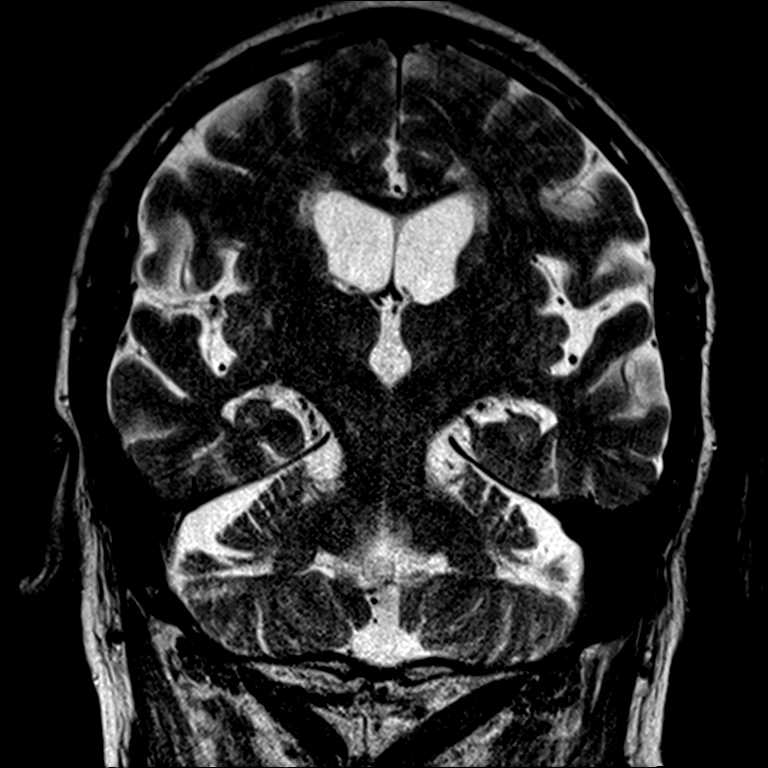
[im 35/35]
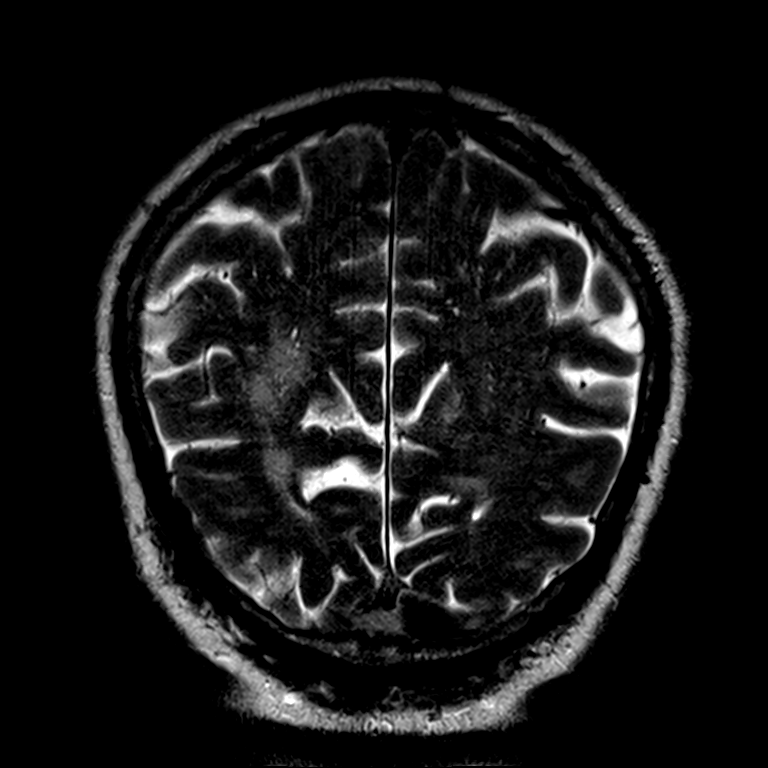

[Series 16: FLAIR · coronal · 3.0mm · 0.35mm/px · 3 of 35 slices shown (1 of 2)]
[im 1/35]
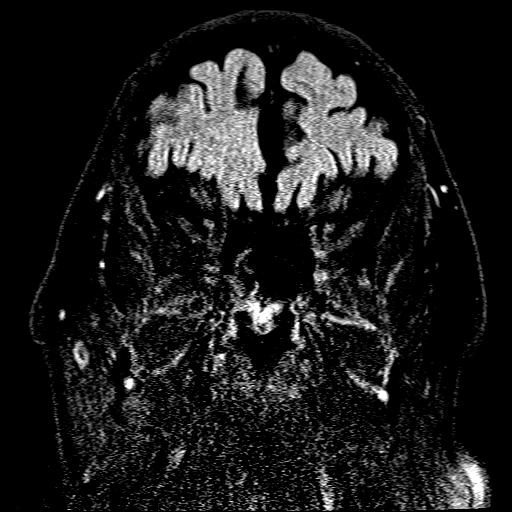
[im 18/35]
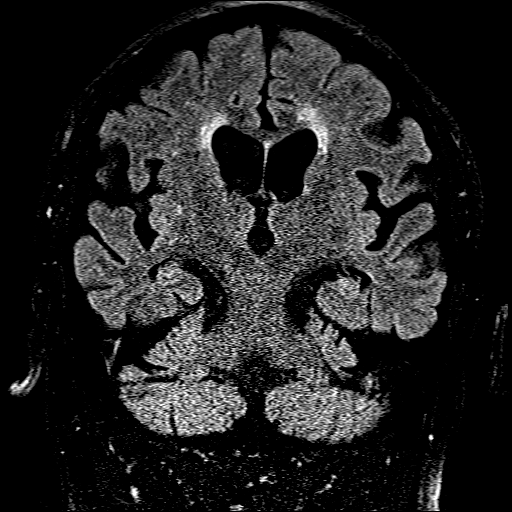
[im 35/35]
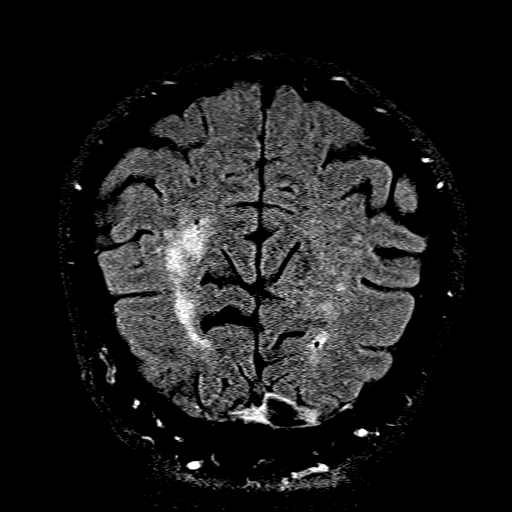

[Series 18: FLAIR · axial · 5.0mm · 1.20mm/px · z∈[-52,+103]mm · 2 of 27 slices shown (2 of 2)]
[im 1/27]
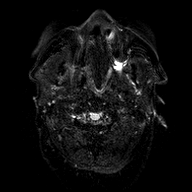
[im 27/27]
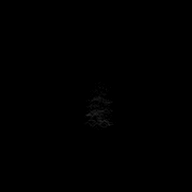

[Series 19: T1 · axial · 5.0mm · 0.90mm/px · z∈[-52,+104]mm · 2 of 27 slices shown (2 of 3)]
[im 1/27]
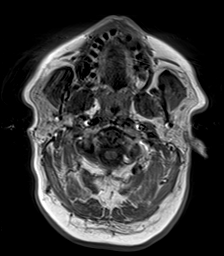
[im 27/27]
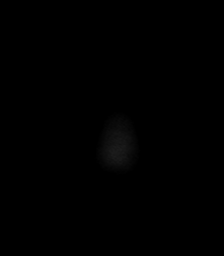

[Series 20: T1 · sagittal · 5.0mm · 0.94mm/px · 2 of 25 slices shown (3 of 3)]
[im 1/25]
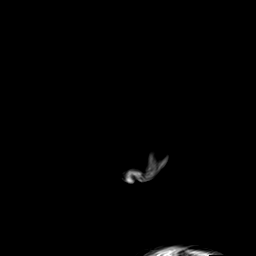
[im 25/25]
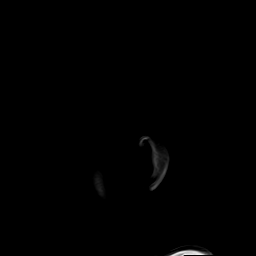

[26 of 48 positions shown; findings below may reference images not displayed]

FINDINGS: Brain: No acute infarct, mass, midline shift, or extra-axial fluid
collection is identified. Chronic microhemorrhages are again noted
in the posterior left temporal lobe, left insula, and possibly right
occipital lobe. Patchy T2 hyperintensities throughout the cerebral
white matter and pons are unchanged from the prior MRI and
nonspecific but compatible with moderate chronic small vessel
ischemic disease. Chronic right larger than left cerebellar infarcts
are unchanged as are chronic lacunar infarcts in the pons, thalami,
and right basal ganglia. There is moderate cerebral atrophy. The
hippocampi are symmetric in size without focal signal abnormality
identified.

Vascular: Major intracranial vascular flow voids are preserved.

Skull and upper cervical spine: Unremarkable bone marrow signal.

Sinuses/Orbits: Unremarkable orbits. Mild mucosal thickening in the
paranasal sinuses, improved from the prior MRI. Clear mastoid air
cells.

Other: None.
IMPRESSION: 1. No acute intracranial abnormality.
2. Unchanged moderate chronic small vessel ischemic disease with
multiple old infarcts involving the deep gray nuclei, brainstem, and
cerebellum.
